# Patient Record
Sex: Male | Born: 1965
Health system: Southern US, Community
[De-identification: ages and names within clinical notes are randomized; demographics above are authoritative.]

## PROBLEM LIST (undated history)

## (undated) DIAGNOSIS — E785 Hyperlipidemia, unspecified: Secondary | ICD-10-CM

## (undated) DIAGNOSIS — E119 Type 2 diabetes mellitus without complications: Principal | ICD-10-CM

## (undated) DIAGNOSIS — I10 Essential (primary) hypertension: Secondary | ICD-10-CM

## (undated) HISTORY — PX: KNEE ARTHROSCOPY: SUR90

## (undated) HISTORY — PX: CERVICAL SPINE SURGERY: SHX589

## (undated) HISTORY — DX: Essential (primary) hypertension: I10

## (undated) HISTORY — DX: Type 2 diabetes mellitus without complications: E11.9

## (undated) HISTORY — DX: Hyperlipidemia, unspecified: E78.5

## (undated) HISTORY — PX: OTHER SURGICAL HISTORY: SHX169

---

## 2002-01-05 ENCOUNTER — Encounter: Admission: RE | Admit: 2002-01-05 | Discharge: 2002-04-05 | Payer: Self-pay | Admitting: Internal Medicine

## 2002-10-24 ENCOUNTER — Encounter: Payer: Self-pay | Admitting: Internal Medicine

## 2002-10-24 ENCOUNTER — Encounter: Admission: RE | Admit: 2002-10-24 | Discharge: 2002-10-24 | Payer: Self-pay | Admitting: Internal Medicine

## 2012-02-03 LAB — HEMOGLOBIN A1C: Hgb A1c MFr Bld: 8 % — AB (ref 4.0–6.0)

## 2012-05-06 LAB — HEMOGLOBIN A1C: Hgb A1c MFr Bld: 9.6 % — AB (ref 4.0–6.0)

## 2012-05-06 LAB — HM HEPATITIS C SCREENING LAB: HM Hepatitis Screen: NEGATIVE

## 2012-08-08 LAB — BASIC METABOLIC PANEL
Creatinine: 0.8 mg/dL (ref 0.6–1.3)
Glucose: 422 mg/dL
Potassium: 4.8 mmol/L (ref 3.4–5.3)

## 2012-09-07 LAB — BASIC METABOLIC PANEL: Glucose: 326 mg/dL

## 2012-09-07 LAB — HEMOGLOBIN A1C: Hgb A1c MFr Bld: 13.3 % — AB (ref 4.0–6.0)

## 2012-10-11 LAB — HEMOGLOBIN A1C: Hgb A1c MFr Bld: 12 % — AB (ref 4.0–6.0)

## 2013-05-12 LAB — HEMOGLOBIN A1C: Hgb A1c MFr Bld: 12.7 % — AB (ref 4.0–6.0)

## 2013-05-12 LAB — HEPATIC FUNCTION PANEL
ALT: 44 U/L — AB (ref 10–40)
AST: 25 U/L (ref 14–40)
Alkaline Phosphatase: 135 U/L — AB (ref 25–125)

## 2013-05-12 LAB — BASIC METABOLIC PANEL
Creatinine: 0.8 mg/dL (ref 0.6–1.3)
Glucose: 310 mg/dL
Potassium: 4.5 mmol/L (ref 3.4–5.3)

## 2013-05-12 LAB — LIPID PANEL
Cholesterol: 182 mg/dL (ref 0–200)
HDL: 45 mg/dL (ref 35–70)
LDL Cholesterol: 83 mg/dL
Triglycerides: 270 mg/dL — AB (ref 40–160)

## 2013-12-04 ENCOUNTER — Encounter (INDEPENDENT_AMBULATORY_CARE_PROVIDER_SITE_OTHER): Payer: Self-pay

## 2013-12-04 ENCOUNTER — Encounter: Payer: Self-pay | Admitting: Family Medicine

## 2013-12-04 ENCOUNTER — Ambulatory Visit (INDEPENDENT_AMBULATORY_CARE_PROVIDER_SITE_OTHER): Payer: 59 | Admitting: Family Medicine

## 2013-12-04 VITALS — BP 144/95 | HR 112 | Ht 65.5 in | Wt 220.0 lb

## 2013-12-04 DIAGNOSIS — E785 Hyperlipidemia, unspecified: Secondary | ICD-10-CM

## 2013-12-04 DIAGNOSIS — E1159 Type 2 diabetes mellitus with other circulatory complications: Secondary | ICD-10-CM | POA: Insufficient documentation

## 2013-12-04 DIAGNOSIS — E1169 Type 2 diabetes mellitus with other specified complication: Secondary | ICD-10-CM | POA: Insufficient documentation

## 2013-12-04 DIAGNOSIS — I152 Hypertension secondary to endocrine disorders: Secondary | ICD-10-CM | POA: Insufficient documentation

## 2013-12-04 DIAGNOSIS — I10 Essential (primary) hypertension: Secondary | ICD-10-CM

## 2013-12-04 DIAGNOSIS — E669 Obesity, unspecified: Secondary | ICD-10-CM

## 2013-12-04 DIAGNOSIS — E118 Type 2 diabetes mellitus with unspecified complications: Secondary | ICD-10-CM | POA: Insufficient documentation

## 2013-12-04 DIAGNOSIS — E119 Type 2 diabetes mellitus without complications: Secondary | ICD-10-CM

## 2013-12-04 HISTORY — DX: Type 2 diabetes mellitus without complications: E11.9

## 2013-12-04 HISTORY — DX: Essential (primary) hypertension: I10

## 2013-12-04 LAB — BASIC METABOLIC PANEL WITH GFR
BUN: 14 mg/dL (ref 6–23)
CO2: 25 mEq/L (ref 19–32)
Calcium: 9.3 mg/dL (ref 8.4–10.5)
Chloride: 99 mEq/L (ref 96–112)
Creat: 0.97 mg/dL (ref 0.50–1.35)
GFR, Est African American: >89 mL/min
GFR, Est Non African American: >89 mL/min
Glucose, Bld: 469 mg/dL — ABNORMAL HIGH (ref 70–99)
Potassium: 4.6 mEq/L (ref 3.5–5.3)
Sodium: 136 mEq/L (ref 135–145)

## 2013-12-04 LAB — HEMOGLOBIN A1C
Hgb A1c MFr Bld: 16.1 % — ABNORMAL HIGH (ref <5.7)
Mean Plasma Glucose: 415 mg/dL — ABNORMAL HIGH (ref <117)

## 2013-12-04 MED ORDER — AMLODIPINE BESY-BENAZEPRIL HCL 5-10 MG PO CAPS: 1.0000 | ORAL_CAPSULE | Freq: Every day | ORAL | Status: DC

## 2013-12-04 MED ORDER — PHENTERMINE HCL 37.5 MG PO CAPS: 37.5000 mg | ORAL_CAPSULE | ORAL | Status: DC

## 2013-12-04 NOTE — Progress Notes (Signed)
CC: Jordan Taylor is a 48 y.o. male is here for Establish Care   Subjective: HPI:  Very pleasant 48 year old here to establish care  Reports a history of type 2 diabetes unknown when this was first diagnosed. Most recent A1c in June 12.7. He reports that blood sugars are worsened by his low for soda he reports he will drink up to 10-12 cans of Pepsi a day. He reports polyuria or polydipsia over the past few months. Reports his blood sugar has been as low as 7-8 when he is able to control his cravings for sugar. He sees ophthalmology regularly. He was once on fargixa and bydueron however could not afford his medications.    Reports a history of essential hypertension: Has been on Lotrel but recently ran out of this medication. He reports he works out maybe once a week. Admits to not monitoring sodium intake.  Obesity: He has been on phentermine in the past and was able to lose an estimated 30 pounds about a year ago using phentermine. He denies any intolerance to the medication. Denies anxiety, depression, mental disturbance nor history of substance abuse   Review of Systems - General ROS: negative for - chills, fever, night sweats, weight gain or weight loss Ophthalmic ROS: negative for - decreased vision Psychological ROS: negative for - anxiety or depression ENT ROS: negative for - hearing change, nasal congestion, tinnitus or allergies Hematological and Lymphatic ROS: negative for - bleeding problems, bruising or swollen lymph nodes Breast ROS: negative Respiratory ROS: no cough, shortness of breath, or wheezing Cardiovascular ROS: no chest pain or dyspnea on exertion Gastrointestinal ROS: no abdominal pain, change in bowel habits, or black or bloody stools Genito-Urinary ROS: negative for - genital discharge, genital ulcers, incontinence or abnormal bleeding from genitals Musculoskeletal ROS: negative for - joint pain or muscle pain Neurological ROS: negative for - headaches or  memory loss Dermatological ROS: negative for lumps, mole changes, rash and skin lesion changes  Past Medical History  Diagnosis Date  . Type 2 diabetes mellitus 12/04/2013  . Essential hypertension 12/04/2013     Family History  Problem Relation Age of Onset  . Alcohol abuse Mother   . Alcohol abuse Father   . Diabetes Father   . Hypertension Father      History  Substance Use Topics  . Smoking status: Never Smoker   . Smokeless tobacco: Not on file  . Alcohol Use: No     Objective: Filed Vitals:   12/04/13 0839  BP: 144/95  Pulse: 112    General: Alert and Oriented, No Acute Distress HEENT: Pupils equal, round, reactive to light. Conjunctivae clear.   moist mucous membranes pharynx unremarkable  Lungs: Clear to auscultation bilaterally, no wheezing/ronchi/rales.  Comfortable work of breathing. Good air movement. Cardiac: Regular rate and rhythm. Normal S1/S2.  No murmurs, rubs, nor gallops.   Abdomen:  obese soft nontender  Extremities: No peripheral edema.  Strong peripheral pulses.  Mental Status: No depression, anxiety, nor agitation. Skin: Warm and dry.  Assessment & Plan: Jordan Taylor was seen today for establish care.  Diagnoses and associated orders for this visit:  Type 2 diabetes mellitus - BASIC METABOLIC PANEL WITH GFR - Hemoglobin A1c  Essential hypertension - BASIC METABOLIC PANEL WITH GFR - Hemoglobin A1c - amLODipine-benazepril (LOTREL) 5-10 MG per capsule; Take 1 capsule by mouth daily.  Obesity - phentermine 37.5 MG capsule; Take 1 capsule (37.5 mg total) by mouth every morning.  Hyperlipidemia    Type 2  diabetes: Uncontrolled, he is optimistic that he can get back into a exercise routine and decrease his cravings for Pepsi he contrast to lower his blood sugars. Will obtain a baseline A1c today and fasting blood sugar were both anticipate this will be quite elevated.  Starting phentermine if no clinical success with blood sugars after one month  of this will be much more aggressive with additional anti-hyperglycemics Essential hypertension: Uncontrolled restart Lotrel, recheck blood pressure in one month after starting phentermine Obesity:  Discussed the benefits of cutting out Pepsi and limiting total caloric intake start phentermine he has had significant success with in the past  Return in about 1 month (around 01/04/2014) for BP and Weight Check.

## 2013-12-05 ENCOUNTER — Telehealth: Payer: Self-pay | Admitting: Family Medicine

## 2013-12-05 DIAGNOSIS — E119 Type 2 diabetes mellitus without complications: Secondary | ICD-10-CM

## 2013-12-05 MED ORDER — SITAGLIPTIN PHOSPHATE 100 MG PO TABS: 100.0000 mg | ORAL_TABLET | Freq: Every day | ORAL | Status: DC

## 2013-12-05 NOTE — Telephone Encounter (Signed)
Pt's wife answered the phone and she was aware of pt's appt yesterday and it was apparent that he had talked to her about him having lab work. I did tell her the results and transferred her up front to schedule a month f/u for the patient

## 2013-12-05 NOTE — Telephone Encounter (Signed)
Sue LushAndrea, Will you please let Mr. Jordan Taylor know that as expected, his blood sugar was quite elevated at 469 with an A1c of 16.1.  If he's never tried Januvia I would strongly recommend he start this medication now to help bring blood sugar down along with metformin, diet, and exercise.  Samples placed in your inbox, I'd still encourage f/u in 1 month.

## 2013-12-13 ENCOUNTER — Encounter: Payer: Self-pay | Admitting: *Deleted

## 2013-12-13 ENCOUNTER — Other Ambulatory Visit: Payer: Self-pay | Admitting: Family Medicine

## 2013-12-13 DIAGNOSIS — E669 Obesity, unspecified: Secondary | ICD-10-CM

## 2013-12-13 DIAGNOSIS — E119 Type 2 diabetes mellitus without complications: Secondary | ICD-10-CM

## 2014-01-08 ENCOUNTER — Ambulatory Visit (INDEPENDENT_AMBULATORY_CARE_PROVIDER_SITE_OTHER): Payer: 59 | Admitting: Family Medicine

## 2014-01-08 ENCOUNTER — Encounter: Payer: Self-pay | Admitting: Family Medicine

## 2014-01-08 VITALS — BP 155/100 | HR 114 | Wt 218.0 lb

## 2014-01-08 DIAGNOSIS — E669 Obesity, unspecified: Secondary | ICD-10-CM

## 2014-01-08 DIAGNOSIS — E119 Type 2 diabetes mellitus without complications: Secondary | ICD-10-CM

## 2014-01-08 DIAGNOSIS — I10 Essential (primary) hypertension: Secondary | ICD-10-CM

## 2014-01-08 MED ORDER — AMLODIPINE BESY-BENAZEPRIL HCL 5-10 MG PO CAPS
2.0000 | ORAL_CAPSULE | Freq: Every day | ORAL | Status: DC
Start: 2014-01-08 — End: 2014-04-02

## 2014-01-08 MED ORDER — PHENTERMINE HCL 37.5 MG PO CAPS: 37.5000 mg | ORAL_CAPSULE | ORAL | Status: DC

## 2014-01-08 MED ORDER — SITAGLIPTIN PHOSPHATE 100 MG PO TABS: 100.0000 mg | ORAL_TABLET | Freq: Every day | ORAL | Status: DC

## 2014-01-08 NOTE — Progress Notes (Signed)
CC: Jordan Taylor is a 48 y.o. male is here for Diabetes   Subjective: HPI:  Followup type 2 diabetes: He continues on metformin and Januvia which she recently started and does not have any appreciable side effects. Denies polyuria polyphagia polydipsia nor poorly healing wounds or vision loss. He admits that he is still drinking up to 5-8 colas on a daily basis down from 10-12. Reports that he'll often do this late at night or in the early morning hours. Fasting blood sugars have been ranging in the low 200s no postprandials to report. Denies hypoglycemic episodes.  Followup obesity: Since restarting phentermine has noticed help with portion control when it comes to eating. He is also working out now less than most days of the week on a stairmaster, it sounds like 5 minutes at a time each day.  Followup essential hypertension: Since restarting Lotrel does not notice any known side effects. No outside blood pressures to report. Denies chest pain, shortness of breath, orthopnea, peripheral edema nor motor or sensory disturbances   Review Of Systems Outlined In HPI  Past Medical History  Diagnosis Date  . Type 2 diabetes mellitus 12/04/2013  . Essential hypertension 12/04/2013    Past Surgical History  Procedure Laterality Date  . Spinal cord surgery     Family History  Problem Relation Age of Onset  . Alcohol abuse Mother   . Alcohol abuse Father   . Diabetes Father   . Hypertension Father     History   Social History  . Marital Status: Married    Spouse Name: N/A    Number of Children: N/A  . Years of Education: N/A   Occupational History  . Not on file.   Social History Main Topics  . Smoking status: Never Smoker   . Smokeless tobacco: Not on file  . Alcohol Use: No  . Drug Use: No  . Sexual Activity: Yes    Partners: Female   Other Topics Concern  . Not on file   Social History Narrative  . No narrative on file     Objective: BP 155/100  Pulse 114  Wt 218  lb (98.884 kg)  General: Alert and Oriented, No Acute Distress HEENT: Pupils equal, round, reactive to light. Conjunctivae clear.  Moist mucous membranes pharynx unremarkable Lungs: Clear to auscultation bilaterally, no wheezing/ronchi/rales.  Comfortable work of breathing. Good air movement. Cardiac: Regular rate and rhythm. Normal S1/S2.  No murmurs, rubs, nor gallops.   Abdomen: Obese soft nontender Extremities: No peripheral edema.  Strong peripheral pulses.  Mental Status: No depression, anxiety, nor agitation. Skin: Warm and dry.  Assessment & Plan: Jordan Taylor was seen today for diabetes.  Diagnoses and associated orders for this visit:  Type 2 diabetes mellitus - sitaGLIPtin (JANUVIA) 100 MG tablet; Take 1 tablet (100 mg total) by mouth daily.  Obesity - phentermine 37.5 MG capsule; Take 1 capsule (37.5 mg total) by mouth every morning.  Essential hypertension - amLODipine-benazepril (LOTREL) 5-10 MG per capsule; Take 2 capsules by mouth daily.    Type 2 diabetes: Uncontrolled continue Januvia and metformin we discussed the importance of cutting out cola in his diet and that this would make a significant drop in his A1c in improve overall blood sugar on a daily basis. Stressed the importance of this intervention Obesity: Improving continue phentermine pending the below plan Essential hypertension: Uncontrolled increasing Lotrel if blood pressure remains uncontrolled at next month visit we'll have to stop the phentermine  Return in about  4 weeks (around 02/05/2014) for BP weight check.

## 2014-01-29 ENCOUNTER — Ambulatory Visit (INDEPENDENT_AMBULATORY_CARE_PROVIDER_SITE_OTHER): Payer: 59 | Admitting: Family Medicine

## 2014-01-29 ENCOUNTER — Encounter: Payer: Self-pay | Admitting: Family Medicine

## 2014-01-29 ENCOUNTER — Encounter (INDEPENDENT_AMBULATORY_CARE_PROVIDER_SITE_OTHER): Payer: Self-pay

## 2014-01-29 VITALS — BP 137/87 | HR 109 | Temp 98.4°F | Wt 221.0 lb

## 2014-01-29 DIAGNOSIS — R05 Cough: Secondary | ICD-10-CM

## 2014-01-29 DIAGNOSIS — A499 Bacterial infection, unspecified: Secondary | ICD-10-CM

## 2014-01-29 DIAGNOSIS — R059 Cough, unspecified: Secondary | ICD-10-CM

## 2014-01-29 DIAGNOSIS — J329 Chronic sinusitis, unspecified: Secondary | ICD-10-CM

## 2014-01-29 DIAGNOSIS — B9689 Other specified bacterial agents as the cause of diseases classified elsewhere: Secondary | ICD-10-CM

## 2014-01-29 MED ORDER — AMOXICILLIN-POT CLAVULANATE 500-125 MG PO TABS: ORAL_TABLET | ORAL | Status: AC

## 2014-01-29 MED ORDER — HYDROCODONE-HOMATROPINE 5-1.5 MG/5ML PO SYRP: 5.0000 mL | ORAL_SOLUTION | Freq: Three times a day (TID) | ORAL | Status: DC | PRN

## 2014-01-29 NOTE — Progress Notes (Signed)
CC: Jordan Taylor is a 48 y.o. male is here for URI?   Subjective: HPI:  Complains of nonproductive cough and facial pressure she has been present for the past 2 weeks moderate severity present all hours of the day worse lying down at night. Cough is described as nonproductive and keeping him awake at night on a nightly basis. No benefit from NyQuil, zycam, and Mucinex. Accompanied by fatigue and body aches facial pressure localized in the for head worse when leaning forward.  Denies shortness of breath, wheezing, blood and sputum, fevers, chills, confusion nor motor or sensory disturbances   Review Of Systems Outlined In HPI  Past Medical History  Diagnosis Date  . Type 2 diabetes mellitus 12/04/2013  . Essential hypertension 12/04/2013    Past Surgical History  Procedure Laterality Date  . Spinal cord surgery     Family History  Problem Relation Age of Onset  . Alcohol abuse Mother   . Alcohol abuse Father   . Diabetes Father   . Hypertension Father     History   Social History  . Marital Status: Married    Spouse Name: N/A    Number of Children: N/A  . Years of Education: N/A   Occupational History  . Not on file.   Social History Main Topics  . Smoking status: Never Smoker   . Smokeless tobacco: Not on file  . Alcohol Use: No  . Drug Use: No  . Sexual Activity: Yes    Partners: Female   Other Topics Concern  . Not on file   Social History Narrative  . No narrative on file     Objective: BP 137/87  Pulse 109  Temp(Src) 98.4 F (36.9 C) (Oral)  Wt 221 lb (100.245 kg)  SpO2 97%  General: Alert and Oriented, No Acute Distress HEENT: Pupils equal, round, reactive to light. Conjunctivae clear.  External ears unremarkable, canals clear with intact TMs with appropriate landmarks.  Middle ear appears open without effusion. Pink inferior turbinates.  Moist mucous membranes, pharynx without inflammation nor lesions however moderate post nasal drip.  Neck supple  without palpable lymphadenopathy nor abnormal masses. Lungs: Clear to auscultation bilaterally, no wheezing/ronchi/rales.  Comfortable work of breathing. Good air movement. Cardiac: Regular rate and rhythm. Normal S1/S2.  No murmurs, rubs, nor gallops.   Mental Status: No depression, anxiety, nor agitation. Skin: Warm and dry.  Assessment & Plan: Jordan Taylor was seen today for uri?.  Diagnoses and associated orders for this visit:  Cough - HYDROcodone-homatropine (HYCODAN) 5-1.5 MG/5ML syrup; Take 5 mLs by mouth every 8 (eight) hours as needed for cough.  Bacterial sinusitis - amoxicillin-clavulanate (AUGMENTIN) 500-125 MG per tablet; Take one by mouth every 8 hours for ten total days.    Bacterial sinusitis: Start Augmentin continue Mucinex consider nasal saline washes a start Hycodan to help with sleep suspect cough is due to postnasal drip   Return in about 1 week (around 02/05/2014) for weight check.

## 2014-02-07 ENCOUNTER — Ambulatory Visit (INDEPENDENT_AMBULATORY_CARE_PROVIDER_SITE_OTHER): Payer: 59 | Admitting: *Deleted

## 2014-02-07 ENCOUNTER — Encounter: Payer: Self-pay | Admitting: *Deleted

## 2014-02-07 VITALS — BP 122/81 | HR 94 | Wt 213.0 lb

## 2014-02-07 DIAGNOSIS — E669 Obesity, unspecified: Secondary | ICD-10-CM

## 2014-02-07 DIAGNOSIS — R635 Abnormal weight gain: Secondary | ICD-10-CM

## 2014-02-07 MED ORDER — PHENTERMINE HCL 37.5 MG PO CAPS
37.5000 mg | ORAL_CAPSULE | ORAL | Status: DC
Start: 2014-02-07 — End: 2014-03-05

## 2014-02-07 NOTE — Progress Notes (Signed)
BP and Weight Improving, Refilling Phentermine

## 2014-02-07 NOTE — Progress Notes (Signed)
   Subjective:    Patient ID: Jordan Taylor, male    DOB: [birth year], 48 y.o.   MRN: [id number]  HPI   Patient doing well on appetite suppressant.  Here for nurse visit, weight, BP, HR check.  Denies problems with insomnia, heart palpitations or tremors.  Satisfied with weight loss thus far and is working on [employer] diet and regular exercise.    Review of Systems     Objective:   Physical Exam        Assessment & Plan:

## 2014-02-07 NOTE — Progress Notes (Signed)
Pt was notified this am that meds will be sent and i faxed it to [facility] this am

## 2014-03-05 ENCOUNTER — Ambulatory Visit (INDEPENDENT_AMBULATORY_CARE_PROVIDER_SITE_OTHER): Payer: 59 | Admitting: Family Medicine

## 2014-03-05 ENCOUNTER — Encounter: Payer: Self-pay | Admitting: Family Medicine

## 2014-03-05 VITALS — BP 133/87 | HR 61 | Wt 215.0 lb

## 2014-03-05 DIAGNOSIS — E119 Type 2 diabetes mellitus without complications: Secondary | ICD-10-CM

## 2014-03-05 DIAGNOSIS — I10 Essential (primary) hypertension: Secondary | ICD-10-CM

## 2014-03-05 DIAGNOSIS — E669 Obesity, unspecified: Secondary | ICD-10-CM

## 2014-03-05 LAB — HEMOGLOBIN A1C
Hgb A1c MFr Bld: 10.1 % — ABNORMAL HIGH (ref <5.7)
Mean Plasma Glucose: 243 mg/dL — ABNORMAL HIGH (ref <117)

## 2014-03-05 MED ORDER — PHENTERMINE HCL 37.5 MG PO TABS: 37.5000 mg | ORAL_TABLET | Freq: Every day | ORAL | Status: DC

## 2014-03-05 NOTE — Progress Notes (Signed)
CC: Jordan Taylor is a 48 y.o. male is here for Diabetes   Subjective: HPI:  followup type 2 diabetes: He continues on metformin and Januvia daily basis no known side effects. Checking fasting blood sugars most recently have been in the low 200s. Denies polyuria polyphasia or polydipsia. He is working out to 3 times a week and tries to stay active during the day. He is noticing that pants sizes are shrinking along with short sizes. He believes that he is gaining lean muscle mass. He has questions regarding whether or not he should take chocolate milk or protein supplements after working out.  Followup obesity: Exercise as listed above. He is on phentermine a daily basis without noted side effects. Denies sleep disturbance, anxiety, nor chest pain.  Followup essential hypertension: No outside blood pressures to report that he is taking Lotrel on a daily basis. Denies chest pain shortness of breath orthopnea nor peripheral edema   Review Of Systems Outlined In HPI  Past Medical History  Diagnosis Date  . Type 2 diabetes mellitus 12/04/2013  . Essential hypertension 12/04/2013    Past Surgical History  Procedure Laterality Date  . Spinal cord surgery     Family History  Problem Relation Age of Onset  . Alcohol abuse Mother   . Alcohol abuse Father   . Diabetes Father   . Hypertension Father     History   Social History  . Marital Status: Married    Spouse Name: N/A    Number of Children: N/A  . Years of Education: N/A   Occupational History  . Not on file.   Social History Main Topics  . Smoking status: Never Smoker   . Smokeless tobacco: Not on file  . Alcohol Use: No  . Drug Use: No  . Sexual Activity: Yes    Partners: Female   Other Topics Concern  . Not on file   Social History Narrative  . No narrative on file     Objective: BP 133/87  Pulse 61  Wt 215 lb (97.523 kg)  General: Alert and Oriented, No Acute Distress HEENT: Pupils equal, round, reactive  to light. Conjunctivae clear.  Moist mucous membranes pharynx unremarkable Lungs: Clear to auscultation bilaterally, no wheezing/ronchi/rales.  Comfortable work of breathing. Good air movement. Cardiac: Regular rate and rhythm. Normal S1/S2.  No murmurs, rubs, nor gallops.   Extremities: No peripheral edema.  Strong peripheral pulses.  Mental Status: No depression, anxiety, nor agitation. Skin: Warm and dry.  Assessment & Plan: Jordan Taylor was seen today for diabetes.  Diagnoses and associated orders for this visit:  Essential hypertension  Obesity - phentermine (ADIPEX-P) 37.5 MG tablet; Take 1 tablet (37.5 mg total) by mouth daily before breakfast.  Type 2 diabetes mellitus - Hemoglobin A1c    Essential hypertension: Controlled continue Lotrel Obesity: Improving I believe that his weight gain is actually due to muscle mass, it does appear that he's losing fat, continue phentermine Type 2 diabetes: Uncontrolled based on fasting blood sugar check an A1c we'll consider XigduoXR doses based on his A1c  Return in about 3 months (around 06/04/2014).

## 2014-03-06 ENCOUNTER — Telehealth: Payer: Self-pay | Admitting: Family Medicine

## 2014-03-06 DIAGNOSIS — E119 Type 2 diabetes mellitus without complications: Secondary | ICD-10-CM

## 2014-03-06 MED ORDER — DAPAGLIFLOZIN PRO-METFORMIN ER 10-1000 MG PO TB24: 1.0000 | ORAL_TABLET | Freq: Every day | ORAL | Status: DC

## 2014-03-06 NOTE — Telephone Encounter (Signed)
Sue Lushndrea, Will you please let patient know that his A1c has improved from 16 to 10 with a goal of less than seven.  I would encourage him to take advantage of a new form of metformin that has an ingredient that will help his kidneys remove blood sugar through the urine.  I've sent an Rx of this in to replace the metformin he is already taking.  We have a coupon card here (on the counter) that will make this medication 100% free.

## 2014-03-06 NOTE — Telephone Encounter (Signed)
Pt.notified

## 2014-04-01 ENCOUNTER — Other Ambulatory Visit: Payer: Self-pay | Admitting: Family Medicine

## 2014-04-02 ENCOUNTER — Ambulatory Visit: Payer: 59

## 2014-04-02 ENCOUNTER — Other Ambulatory Visit: Payer: Self-pay | Admitting: *Deleted

## 2014-04-02 DIAGNOSIS — I10 Essential (primary) hypertension: Secondary | ICD-10-CM

## 2014-04-02 MED ORDER — AMLODIPINE BESY-BENAZEPRIL HCL 5-10 MG PO CAPS: 2.0000 | ORAL_CAPSULE | Freq: Every day | ORAL | Status: DC

## 2014-04-03 ENCOUNTER — Ambulatory Visit (INDEPENDENT_AMBULATORY_CARE_PROVIDER_SITE_OTHER): Payer: 59 | Admitting: Family Medicine

## 2014-04-03 VITALS — BP 137/89 | HR 104 | Ht 65.0 in | Wt 215.0 lb

## 2014-04-03 DIAGNOSIS — E669 Obesity, unspecified: Secondary | ICD-10-CM

## 2014-04-03 DIAGNOSIS — R635 Abnormal weight gain: Secondary | ICD-10-CM

## 2014-04-03 MED ORDER — PHENTERMINE HCL 37.5 MG PO TABS: 37.5000 mg | ORAL_TABLET | Freq: Every day | ORAL | Status: DC

## 2014-04-03 NOTE — Progress Notes (Signed)
Patient was in office for weight and blood pressure  Check. Jordan Taylor,CMA

## 2014-04-03 NOTE — Progress Notes (Signed)
Bjorn LoserRhonda, Will you please let patient know Rx is ready for fax/pickup, we need to see weight loss by next month or the FDA will not approve this medication to be continued.

## 2014-05-03 ENCOUNTER — Ambulatory Visit (INDEPENDENT_AMBULATORY_CARE_PROVIDER_SITE_OTHER): Payer: 59 | Admitting: Family Medicine

## 2014-05-03 ENCOUNTER — Encounter: Payer: Self-pay | Admitting: *Deleted

## 2014-05-03 VITALS — BP 132/75 | HR 100 | Wt 210.0 lb

## 2014-05-03 DIAGNOSIS — R635 Abnormal weight gain: Secondary | ICD-10-CM

## 2014-05-03 DIAGNOSIS — E669 Obesity, unspecified: Secondary | ICD-10-CM

## 2014-05-03 MED ORDER — PHENTERMINE HCL 37.5 MG PO TABS: 37.5000 mg | ORAL_TABLET | Freq: Every day | ORAL | Status: DC

## 2014-05-03 NOTE — Progress Notes (Signed)
Pt is here today for weight and blood pressure check. Pt is down 5 lbs from last visit. No complaints or side affects. Freida Busman CMA

## 2014-05-17 ENCOUNTER — Other Ambulatory Visit: Payer: Self-pay | Admitting: Family Medicine

## 2014-05-30 ENCOUNTER — Ambulatory Visit (INDEPENDENT_AMBULATORY_CARE_PROVIDER_SITE_OTHER): Payer: 59 | Admitting: Physician Assistant

## 2014-05-30 VITALS — BP 114/73 | HR 97 | Ht 65.0 in | Wt 204.0 lb

## 2014-05-30 DIAGNOSIS — E669 Obesity, unspecified: Secondary | ICD-10-CM

## 2014-05-30 DIAGNOSIS — R635 Abnormal weight gain: Secondary | ICD-10-CM

## 2014-05-30 MED ORDER — PHENTERMINE HCL 37.5 MG PO TABS: 37.5000 mg | ORAL_TABLET | Freq: Every day | ORAL | Status: DC

## 2014-05-30 NOTE — Progress Notes (Signed)
   Subjective:    Patient ID: Jordan Taylor, male    DOB: 08-12-1966, 48 y.o.   MRN: [id number]  HPI Patient presents today for weight and BP check for phentermine refill. Patient denies any chest pain, dizziness, SOB or abdominal pain while using medication. Patient was reminded to schedule another follow up appt in month.   Review of Systems     Objective:   Physical Exam        Assessment & Plan:  Pt down 6lbs. BP and HR stable. Refilled for another month. Follow up in one month. Keep up exercise and diet changes. Tandy GawJade Breeback PA-C

## 2014-06-29 ENCOUNTER — Ambulatory Visit: Payer: 59 | Admitting: Family Medicine

## 2014-06-29 ENCOUNTER — Ambulatory Visit (INDEPENDENT_AMBULATORY_CARE_PROVIDER_SITE_OTHER): Payer: 59 | Admitting: Family Medicine

## 2014-06-29 ENCOUNTER — Encounter: Payer: Self-pay | Admitting: Family Medicine

## 2014-06-29 VITALS — BP 114/77 | HR 94 | Temp 97.7°F | Wt 206.0 lb

## 2014-06-29 DIAGNOSIS — E669 Obesity, unspecified: Secondary | ICD-10-CM

## 2014-06-29 DIAGNOSIS — R635 Abnormal weight gain: Secondary | ICD-10-CM

## 2014-06-29 NOTE — Progress Notes (Signed)
Stacy, Will you please let patient know that due to lack of weight loss phentermine cannot be continued. There are a variety of other diet medications that can still be used and I would encourage him to followup with me if he is interested in trying alternative medications.

## 2014-06-29 NOTE — Progress Notes (Signed)
Pt came in today for bp and weight check. Pt is up 2 lbs. Pt states that he has been counting his calories, exercising with weights more and working out everyday and eating healthy./Kota Ciancio,CMA

## 2014-06-29 NOTE — Progress Notes (Signed)
I informed pt of the phentermine and the variety of other diet medications and pt made a follow up appointment with Dr. Ivan AnchorsHommel to discuss the alternatives. I also told the pt to continue counting his calorie, exercise and eating healthy./Trevell Pariseau,CMA

## 2014-07-02 ENCOUNTER — Encounter: Payer: Self-pay | Admitting: Family Medicine

## 2014-07-02 ENCOUNTER — Ambulatory Visit (INDEPENDENT_AMBULATORY_CARE_PROVIDER_SITE_OTHER): Payer: 59 | Admitting: Family Medicine

## 2014-07-02 VITALS — BP 121/80 | HR 97 | Wt 201.0 lb

## 2014-07-02 DIAGNOSIS — N529 Male erectile dysfunction, unspecified: Secondary | ICD-10-CM

## 2014-07-02 DIAGNOSIS — I10 Essential (primary) hypertension: Secondary | ICD-10-CM

## 2014-07-02 DIAGNOSIS — E119 Type 2 diabetes mellitus without complications: Secondary | ICD-10-CM

## 2014-07-02 DIAGNOSIS — E669 Obesity, unspecified: Secondary | ICD-10-CM

## 2014-07-02 MED ORDER — TADALAFIL 20 MG PO TABS: 10.0000 mg | ORAL_TABLET | Freq: Every day | ORAL | Status: DC | PRN

## 2014-07-02 MED ORDER — PHENTERMINE HCL 37.5 MG PO TABS: 37.5000 mg | ORAL_TABLET | Freq: Every day | ORAL | Status: DC

## 2014-07-02 NOTE — Progress Notes (Signed)
CC: Jordan Taylor is a 48 y.o. male is here for discuss wt loss meds   Subjective: HPI:  Followup obesity: Continues to go to gym most days of the week. Working on [employer], broadcasting/film/videostrength training and cardio. Has had success with phentermine with losing weight and feeling with portion control. When he was weighed last week during a nurse visit he tells me that he had his work clothes on and instruments in his pocket and he was not allowed to take out prior to being weighed. Denies anxiety, nor any other intolerance to phentermine  Followup type 2 diabetes: Continues on Januvia and dapagliflozin-metformin without any noted side effects. No outside blood sugars to report. Denies polyuria polyphagia polydipsia nor poorly healing wounds. Still trying to cut back on soda intake  Patient complains of difficulty maintaining an erection beyond a few minutes it has been worsening over the past month. Symptoms occur during every sexual encounter. No change in libido. Denies dysuria, polyuria, testicular pain or any history of exertional chest pain. He wants to know if he can try medication to help with erectile dysfunction  Followup essential hypertension: Continues to take Lotrel on daily basis with no outside blood pressures to report     Review Of Systems Outlined In HPI  Past Medical History  Diagnosis Date  . Type 2 diabetes mellitus 12/04/2013  . Essential hypertension 12/04/2013    Past Surgical History  Procedure Laterality Date  . Spinal cord surgery     Family History  Problem Relation Age of Onset  . Alcohol abuse Mother   . Alcohol abuse Father   . Diabetes Father   . Hypertension Father     History   Social History  . Marital Status: Married    Spouse Name: N/A    Number of Children: N/A  . Years of Education: N/A   Occupational History  . Not on file.   Social History Main Topics  . Smoking status: Never Smoker   . Smokeless tobacco: Not on file  . Alcohol Use: No  . Drug Use:  No  . Sexual Activity: Yes    Partners: Female   Other Topics Concern  . Not on file   Social History Narrative  . No narrative on file     Objective: BP 121/80  Pulse 97  Wt 201 lb (91.173 kg)  General: Alert and Oriented, No Acute Distress HEENT: Pupils equal, round, reactive to light. Conjunctivae clear.  Moist mucous membranes pharynx unremarkable Lungs: Clear to auscultation bilaterally, no wheezing/ronchi/rales.  Comfortable work of breathing. Good air movement. Cardiac: Regular rate and rhythm. Normal S1/S2.  No murmurs, rubs, nor gallops.   Abdomen: Mild obesity and soft Extremities: No peripheral edema.  Strong peripheral pulses.  Mental Status: No depression, anxiety, nor agitation. Skin: Warm and dry.  Assessment & Plan: Jordan Taylor was seen today for discuss wt loss meds.  Diagnoses and associated orders for this visit:  Obesity - phentermine (ADIPEX-P) 37.5 MG tablet; Take 1 tablet (37.5 mg total) by mouth daily before breakfast.  Essential hypertension  Type 2 diabetes mellitus without complication - HgB A1c  Erectile dysfunction, unspecified erectile dysfunction type - tadalafil (CIALIS) 20 MG tablet; Take 0.5-1 tablets (10-20 mg total) by mouth daily as needed for erectile dysfunction.    Obesity: Improving, he can restart phentermine in actually appears that he is l still osing weight on this medication. Discussed with him that at some point he is probably going to reach a plateau and  he should start looking into whether or not he would want to start on Belviq vs Qsymia once his plateau was reached Type 2 diabetes: Check an A1c today continue current antihyperglycemic spending results Essential hypertension: Controlled continue Lotrel Erectile dysfunction: Trial of Cialis   Return in about 3 months (around 10/02/2014).

## 2014-07-09 ENCOUNTER — Encounter: Payer: Self-pay | Admitting: Family Medicine

## 2014-07-18 ENCOUNTER — Other Ambulatory Visit: Payer: Self-pay | Admitting: Family Medicine

## 2014-07-31 ENCOUNTER — Encounter: Payer: Self-pay | Admitting: Family Medicine

## 2014-08-02 ENCOUNTER — Telehealth: Payer: Self-pay | Admitting: Family Medicine

## 2014-08-02 MED ORDER — PHENTERMINE-TOPIRAMATE ER 7.5-46 MG PO CP24: ORAL_CAPSULE | ORAL | Status: DC

## 2014-08-02 MED ORDER — PHENTERMINE-TOPIRAMATE ER 3.75-23 MG PO CP24: ORAL_CAPSULE | ORAL | Status: DC

## 2014-08-02 NOTE — Telephone Encounter (Signed)
Sue Lush, Will you please fax the Qsymia Rxs to the [facility] (in your inbox) and let patient know that these were sent?

## 2014-08-02 NOTE — Telephone Encounter (Signed)
Message left on vm 

## 2014-08-03 ENCOUNTER — Ambulatory Visit (INDEPENDENT_AMBULATORY_CARE_PROVIDER_SITE_OTHER): Payer: 59 | Admitting: Family Medicine

## 2014-08-03 ENCOUNTER — Telehealth: Payer: Self-pay | Admitting: *Deleted

## 2014-08-03 VITALS — BP 105/65 | HR 86 | Wt 205.0 lb

## 2014-08-03 DIAGNOSIS — R635 Abnormal weight gain: Secondary | ICD-10-CM

## 2014-08-03 DIAGNOSIS — E119 Type 2 diabetes mellitus without complications: Secondary | ICD-10-CM

## 2014-08-03 MED ORDER — DAPAGLIFLOZIN PRO-METFORMIN ER 10-1000 MG PO TB24: 1.0000 | ORAL_TABLET | Freq: Every day | ORAL | Status: DC

## 2014-08-03 NOTE — Telephone Encounter (Signed)
I don't think he's ever been prescribed kombiglyze.  Perhaps he means dapagliflozin-metformin which is once a day.  I've sent refills for this to the [place] pharmacy.  If he uses a savings card (which we have if he needs it) this medication should be free for a year.

## 2014-08-03 NOTE — Telephone Encounter (Signed)
Per dr. Ivan Anchors samples left at [facility] for pt to come pick up

## 2014-08-03 NOTE — Telephone Encounter (Signed)
Pt had been taking the kombiglyze medication twice a day instead of once a day.Pt is now out of medication of course. We have samples but not of the dose he is on. Please advise

## 2014-08-03 NOTE — Progress Notes (Signed)
   Subjective:    Patient ID: Jordan Taylor, male    DOB: Feb 15, 1966, 48 y.o.   MRN: [id number]  HPI  Jordan Taylor is here for a weight and blood pressure check. Patient gained 4 lbs. He did not give a reason for his increase in weight. Denies chest pain, shortness of breath, headaches or medication problems.  Review of Systems     Objective:   Physical Exam        Assessment & Plan:

## 2014-08-03 NOTE — Progress Notes (Signed)
Marylene Land, will you please let patient know that phentermine has lost it's effectiveness.   Switch to qsymia for weight loss which was faxed to [facility] [place] yesterday.

## 2014-08-04 LAB — HEMOGLOBIN A1C
Hgb A1c MFr Bld: 10.5 % — ABNORMAL HIGH (ref <5.7)
Mean Plasma Glucose: 255 mg/dL — ABNORMAL HIGH (ref <117)

## 2014-08-07 ENCOUNTER — Telehealth: Payer: Self-pay | Admitting: Family Medicine

## 2014-08-07 MED ORDER — GLIPIZIDE ER 5 MG PO TB24: 5.0000 mg | ORAL_TABLET | Freq: Every day | ORAL | Status: DC

## 2014-08-07 NOTE — Telephone Encounter (Signed)
Jordan Taylor, Will you please let patient know that his A1c was 10.5 with a goal of less than 7.  I'd encourage him to continue with his diabetic medication and to add an additional medication called glipizide which I've sent to the pharmacy at the [facility] in [facility].  We'll want to recheck his A1c in three months.

## 2014-08-07 NOTE — Telephone Encounter (Signed)
Left message on pt.'s vm.

## 2014-11-03 ENCOUNTER — Encounter: Payer: 59 | Attending: Family Medicine

## 2014-11-03 VITALS — Ht 65.0 in | Wt 219.0 lb

## 2014-11-03 DIAGNOSIS — E119 Type 2 diabetes mellitus without complications: Secondary | ICD-10-CM | POA: Diagnosis present

## 2014-11-03 DIAGNOSIS — Z713 Dietary counseling and surveillance: Secondary | ICD-10-CM | POA: Insufficient documentation

## 2014-11-05 NOTE — Progress Notes (Signed)
Patient was seen on 11/03/14 for the complete diabetes self-management series at the [facility]. This is a part of the [facility] [employer].  Handouts given during class include:  Living Well with Diabetes book  Carb Counting and Meal Planning book  Meal Plan Card  Carbohydrate guide  Meal planning worksheet  Low Sodium Flavoring Tips  The diabetes portion plate  Low Carbohydrate Snack Suggestions  A1c to eAG Conversion Chart  Diabetes Medications  Stress Management  Diabetes Recommended Care Schedule  Diabetes Success Plan  Core Class Satisfaction Survey  The following learning objectives were met by the patient during this course:  Describe diabetes  State some common risk factors for diabetes  Defines the role of glucose and insulin  Identifies type of diabetes and pathophysiology  Describe the relationship between diabetes and cardiovascular risk  State the members of the Healthcare Team  States the rationale for glucose monitoring  State when to test glucose  State their individual Target Range  State the importance of logging glucose readings  Describe how to interpret glucose readings  Identifies A1C target  Explain the correlation between A1c and eAG values  State symptoms and treatment of high blood glucose  State symptoms and treatment of low blood glucose  Explain proper technique for glucose testing  Identifies proper sharps disposal  Describe the role of different macronutrients on glucose  Explain how carbohydrates affect blood glucose  State what foods contain the most carbohydrates  Demonstrate carbohydrate counting  Demonstrate how to read Nutrition Facts food label  Describe effects of various fats on heart health  Describe the importance of good nutrition for health and healthy eating strategies  Describe techniques for managing your shopping, cooking and meal planning  List  strategies to follow meal plan when dining out  Describe the effects of alcohol on glucose and how to use it safely . State the amount of activity recommended for healthy living . Describe activities suitable for individual needs . Identify ways to regularly incorporate activity into daily life . Identify barriers to activity and ways to over come these barriers  Identify diabetes medications being personally used and their primary action for lowering glucose and possible side effects . Describe role of stress on blood glucose and develop strategies to address psychosocial issues . Identify diabetes complications and ways to prevent them  Explain how to manage diabetes during illness . Evaluate success in meeting personal goal . Establish 2-3 goals that they will plan to diligently work on until they return for the  55-month follow-up visit  Goals:   I will count my carb choices at most meals and snacks  I will be active 30 minutes or more 3 imes a week  I will take my diabetes medications as scheduled  I will test my glucose at least 2 times a day, 3 days a week  I will look at patterns in my record book at least 5 days a month  To help manage stress I will  Take medication as directed  Your patient has identified these potential barriers to change:  Motivation  Your patient has identified their diabetes self-care support plan as  [facility] Support Group Family Forensic scientist Resources  Plan: Follow up with [facility]

## 2014-12-07 ENCOUNTER — Encounter: Payer: Self-pay | Admitting: Family Medicine

## 2014-12-10 ENCOUNTER — Encounter: Payer: Self-pay | Admitting: Family Medicine

## 2014-12-10 ENCOUNTER — Telehealth: Payer: Self-pay | Admitting: *Deleted

## 2014-12-10 ENCOUNTER — Ambulatory Visit (INDEPENDENT_AMBULATORY_CARE_PROVIDER_SITE_OTHER): Payer: 59 | Admitting: Family Medicine

## 2014-12-10 VITALS — BP 150/87 | HR 119 | Ht 65.5 in | Wt 225.0 lb

## 2014-12-10 DIAGNOSIS — Z23 Encounter for immunization: Secondary | ICD-10-CM

## 2014-12-10 DIAGNOSIS — E119 Type 2 diabetes mellitus without complications: Secondary | ICD-10-CM

## 2014-12-10 DIAGNOSIS — Z1211 Encounter for screening for malignant neoplasm of colon: Secondary | ICD-10-CM

## 2014-12-10 DIAGNOSIS — Z0000 Encounter for general adult medical examination without abnormal findings: Secondary | ICD-10-CM

## 2014-12-10 MED ORDER — SITAGLIPTIN PHOSPHATE 100 MG PO TABS: 100.0000 mg | ORAL_TABLET | Freq: Every day | ORAL | Status: DC

## 2014-12-10 MED ORDER — AMLODIPINE BESY-BENAZEPRIL HCL 5-10 MG PO CAPS: ORAL_CAPSULE | ORAL | Status: DC

## 2014-12-10 MED ORDER — PRAVASTATIN SODIUM 20 MG PO TABS: 20.0000 mg | ORAL_TABLET | Freq: Every evening | ORAL | Status: DC

## 2014-12-10 MED ORDER — GLIPIZIDE ER 5 MG PO TB24: 5.0000 mg | ORAL_TABLET | Freq: Every day | ORAL | Status: DC

## 2014-12-10 NOTE — Telephone Encounter (Signed)
Prior auth inititiated in cover my meds and faxed to [employer]. Awaiting decision.

## 2014-12-10 NOTE — Patient Instructions (Signed)
Dr. Danika Kluender's General Advice Following Your Complete Physical Exam  The Benefits of Regular Exercise: Unless you suffer from an uncontrolled cardiovascular condition, studies strongly suggest that regular exercise and physical activity will add to both the quality and length of your life.  The World Health Organization recommends 150 minutes of moderate intensity aerobic activity every week.  This is best split over 3-4 days a week, and can be as simple as a brisk walk for just over 35 minutes "most days of the week".  This type of exercise has been shown to lower LDL-Cholesterol, lower average blood sugars, lower blood pressure, lower cardiovascular disease risk, improve memory, and increase one's overall sense of wellbeing.  The addition of anaerobic (or "strength training") exercises offers additional benefits including but not limited to increased metabolism, prevention of osteoporosis, and improved overall cholesterol levels.  How Can I Strive For A Low-Fat Diet?: Current guidelines recommend that 25-35 percent of your daily energy (food) intake should come from fats.  One might ask how can this be achieved without having to dissect each meal on a daily basis?  Switch to skim or 1% milk instead of whole milk.  Focus on lean meats such as ground turkey, fresh fish, baked chicken, and lean cuts of beef as your source of dietary protein.  Limit saturated fat consumption to less than 10% of your daily caloric intake.  Limit trans fatty acid consumption primarily by limiting synthetic trans fats such as partially hydrogenated oils (Ex: fried fast foods).  Substitute olive or vegetable oil for solid fats where possible.  Moderation of Salt Intake: Provided you don't carry a diagnosis of congestive heart failure nor renal failure, I recommend a daily allowance of no more than 2300 mg of salt (sodium).  Keeping under this daily goal is associated with a decreased risk of cardiovascular events, creeping  above it can lead to elevated blood pressures and increases your risk of cardiovascular events.  Milligrams (mg) of salt is listed on all nutrition labels, and your daily intake can add up faster than you think.  Most canned and frozen dinners can pack in over half your daily salt allowance in one meal.    Lifestyle Health Risks: Certain lifestyle choices carry specific health risks.  As you may already know, tobacco use has been associated with increasing one's risk of cardiovascular disease, pulmonary disease, numerous cancers, among many other issues.  What you may not know is that there are medications and nicotine replacement strategies that can more than double your chances of successfully quitting.  I would be thrilled to help manage your quitting strategy if you currently use tobacco products.  When it comes to alcohol use, I've yet to find an "ideal" daily allowance.  Provided an individual does not have a medical condition that is exacerbated by alcohol consumption, general guidelines determine "safe drinking" as no more than two standard drinks for a man or no more than one standard drink for a male per day.  However, much debate still exists on whether any amount of alcohol consumption is technically "safe".  My general advice, keep alcohol consumption to a minimum for general health promotion.  If you or others believe that alcohol, tobacco, or recreational drug use is interfering with your life, I would be happy to provide confidential counseling regarding treatment options.  General "Over The Counter" Nutrition Advice: Postmenopausal women should aim for a daily calcium intake of 1200 mg, however a significant portion of this might already be   provided by diets including milk, yogurt, cheese, and other dairy products.  Vitamin D has been shown to help preserve bone density, prevent fatigue, and has even been shown to help reduce falls in the elderly.  Ensuring a daily intake of 800 Units of  Vitamin D is a good place to start to enjoy the above benefits, we can easily check your Vitamin D level to see if you'd potentially benefit from supplementation beyond 800 Units a day.  Folic Acid intake should be of particular concern to women of childbearing age.  Daily consumption of 400-800 mcg of Folic Acid is recommended to minimize the chance of spinal cord defects in a fetus should pregnancy occur.    For many adults, accidents still remain one of the most common culprits when it comes to cause of death.  Some of the simplest but most effective preventitive habits you can adopt include regular seatbelt use, proper helmet use, securing firearms, and regularly testing your smoke and carbon monoxide detectors.  Jordan Burry B. See Beharry DO [facility] [address], [address] [place], [place] [address] Phone: [phone]  

## 2014-12-10 NOTE — Progress Notes (Signed)
CC: Jordan Taylor is a 49 y.o. male is here for Annual Exam   Subjective: HPI:   Colonoscopy: An order has been placed for colonoscopy now that he is over the age 59 Prostate: Discussed screening risks/beneifts with patient during today's visit, he is agreeable to PSA  Influenza Vaccine: Declined today  Pneumovax: No current indication Td/Tdap: Will receive tetanus booster today Zoster: (Start 51 yo)  Presents for complete physical exam with no acute complaints  Rare alcohol use no tobacco or recreational drug use  Review of Systems - General ROS: negative for - chills, fever, night sweats, weight gain or weight loss Ophthalmic ROS: negative for - decreased vision Psychological ROS: negative for - anxiety or depression ENT ROS: negative for - hearing change, nasal congestion, tinnitus or allergies Hematological and Lymphatic ROS: negative for - bleeding problems, bruising or swollen lymph nodes Breast ROS: negative Respiratory ROS: no cough, shortness of breath, or wheezing Cardiovascular ROS: no chest pain or dyspnea on exertion Gastrointestinal ROS: no abdominal pain, change in bowel habits, or black or bloody stools Genito-Urinary ROS: negative for - genital discharge, genital ulcers, incontinence or abnormal bleeding from genitals Musculoskeletal ROS: negative for - joint pain or muscle pain Neurological ROS: negative for - headaches or memory loss Dermatological ROS: negative for lumps, mole changes, rash and skin lesion changes  Past Medical History  Diagnosis Date  . Type 2 diabetes mellitus 12/04/2013  . Essential hypertension 12/04/2013    Past Surgical History  Procedure Laterality Date  . Spinal cord surgery     Family History  Problem Relation Age of Onset  . Alcohol abuse Mother   . Alcohol abuse Father   . Diabetes Father   . Hypertension Father   . Cancer Other     History   Social History  . Marital Status: Married    Spouse Name: N/A    Number  of Children: N/A  . Years of Education: N/A   Occupational History  . Not on file.   Social History Main Topics  . Smoking status: Never Smoker   . Smokeless tobacco: Not on file  . Alcohol Use: No  . Drug Use: No  . Sexual Activity:    Partners: Female   Other Topics Concern  . Not on file   Social History Narrative     Objective: BP 150/87 mmHg  Pulse 119  Ht 5' 5.5" (1.664 m)  Wt 225 lb (102.059 kg)  BMI 36.86 kg/m2  General: No Acute Distress HEENT: Atraumatic, normocephalic, conjunctivae normal without scleral icterus.  No nasal discharge, hearing grossly intact, TMs with good landmarks bilaterally with no middle ear abnormalities, posterior pharynx clear without oral lesions. Neck: Supple, trachea midline, no cervical nor supraclavicular adenopathy. Pulmonary: Clear to auscultation bilaterally without wheezing, rhonchi, nor rales. Cardiac: Regular rate and rhythm.  No murmurs, rubs, nor gallops. No peripheral edema.  2+ peripheral pulses bilaterally. Abdomen: Bowel sounds normal.  No masses.  Non-tender without rebound.  Negative Murphy's sign. GU: Bilateral descended testes without inguinal hernias MSK: Grossly intact, no signs of weakness.  Full strength throughout upper and lower extremities.  Full ROM in upper and lower extremities.  No midline spinal tenderness. Neuro: Gait unremarkable, CN II-XII grossly intact.  C5-C6 Reflex 2/4 Bilaterally, L4 Reflex 2/4 Bilaterally.  Cerebellar function intact. Skin: No rashes. Psych: Alert and oriented to person/place/time.  Thought process normal. No anxiety/depression. Assessment & Plan: Jordan Taylor was seen today for annual exam.  Diagnoses and associated orders  for this visit:  Annual physical exam - Lipid panel - COMPLETE METABOLIC PANEL WITH GFR - Hemoglobin A1c - CBC - PSA - Ambulatory referral to Gastroenterology  Type 2 diabetes mellitus without complication - Lipid panel - COMPLETE METABOLIC PANEL WITH  GFR - Hemoglobin A1c - CBC - sitaGLIPtin (JANUVIA) 100 MG tablet; Take 1 tablet (100 mg total) by mouth daily.  Screening for colon cancer - Ambulatory referral to Gastroenterology  Other Orders - amLODipine-benazepril (LOTREL) 5-10 MG per capsule; TAKE 2 CAPSULES BY MOUTH DAILY. - glipiZIDE (GLUCOTROL XL) 5 MG 24 hr tablet; Take 1 tablet (5 mg total) by mouth daily with breakfast. - pravastatin (PRAVACHOL) 20 MG tablet; Take 1 tablet (20 mg total) by mouth every evening.    Healthy lifestyle interventions including but not limited to regular exercise, a healthy low fat diet, moderation of salt intake, the dangers of tobacco/alcohol/recreational drug use, nutrition supplementation, and accident avoidance were discussed with the patient and a handout was provided for future reference.    Return in about 3 months (around 03/11/2015) for Diabetic Follow Up.

## 2014-12-10 NOTE — Addendum Note (Signed)
Addended by: Wyline BeadyMCCRIMMON, Arvell Pulsifer C on: 12/10/2014 03:53 PM   Modules accepted: Orders

## 2014-12-11 NOTE — Telephone Encounter (Signed)
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  Can you please ask the patient to pick up one of these here or have it mailed to him to take to his pharmacy.

## 2014-12-11 NOTE — Telephone Encounter (Signed)
Denial received today for Xigduo. Placed in Dr. Genelle BalHommel's box.

## 2014-12-12 NOTE — Telephone Encounter (Signed)
Voicemail left on mobile phone for Gerell explaining prior auth denial and the savings card and that he needed to come by office and pick up. He needed to call office if he wanted it mailed or had any questions. Savings card was placed in envelope with his name on it and put in drawer at front desk.

## 2015-01-28 ENCOUNTER — Ambulatory Visit (INDEPENDENT_AMBULATORY_CARE_PROVIDER_SITE_OTHER): Payer: 59 | Admitting: Family Medicine

## 2015-01-28 ENCOUNTER — Encounter: Payer: Self-pay | Admitting: Family Medicine

## 2015-01-28 VITALS — BP 132/84 | HR 105 | Wt 233.0 lb

## 2015-01-28 DIAGNOSIS — I10 Essential (primary) hypertension: Secondary | ICD-10-CM

## 2015-01-28 DIAGNOSIS — E119 Type 2 diabetes mellitus without complications: Secondary | ICD-10-CM

## 2015-01-28 DIAGNOSIS — N529 Male erectile dysfunction, unspecified: Secondary | ICD-10-CM

## 2015-01-28 DIAGNOSIS — E669 Obesity, unspecified: Secondary | ICD-10-CM

## 2015-01-28 MED ORDER — PHENTERMINE HCL 37.5 MG PO TABS: 37.5000 mg | ORAL_TABLET | Freq: Every day | ORAL | Status: DC

## 2015-01-28 MED ORDER — SILDENAFIL CITRATE 20 MG PO TABS: ORAL_TABLET | ORAL | Status: DC

## 2015-01-28 NOTE — Progress Notes (Signed)
CC: Jordan Taylor HockeyRobinson is a 49 y.o. male is here for Follow-up   Subjective: HPI:  Follow-up essential hypertension: Continues on Lotrel On a daily basis with no outside blood pressures to report. No chest pain shortness of breath orthopnea nor peripheral edema.  Follow-up type 2 diabetes: Continues on Januvia, leukocyte and dapagliflozin-metformin on a daily basis with no blood sugars to report. No polyuria nor polydipsia. No poorly healing wounds. He admits to extremely poor eating habits over the past 2 or 3 months due to losing his job and boredom. He decided to get back on track with low carbohydrate intake within the last week and he's been able to stick to it without any difficulty. He's having difficulty losing weight and was not fitting restart phentermine  Follow-up erectile dysfunction: He had benefit from Cialis without any side effects. He is unable to afford this though due to the price. He still reports difficulty with initiating and maintaining an erection without any other genitourinary complaints.   Review Of Systems Outlined In HPI  Past Medical History  Diagnosis Date  . Type 2 diabetes mellitus 12/04/2013  . Essential hypertension 12/04/2013    Past Surgical History  Procedure Laterality Date  . Spinal cord surgery     Family History  Problem Relation Age of Onset  . Alcohol abuse Mother   . Alcohol abuse Father   . Diabetes Father   . Hypertension Father   . Cancer Other     History   Social History  . Marital Status: Married    Spouse Name: N/A  . Number of Children: N/A  . Years of Education: N/A   Occupational History  . Not on file.   Social History Main Topics  . Smoking status: Never Smoker   . Smokeless tobacco: Not on file  . Alcohol Use: No  . Drug Use: No  . Sexual Activity:    Partners: Female   Other Topics Concern  . Not on file   Social History Narrative     Objective: BP 132/84 mmHg  Pulse 105  Wt 233 lb (105.688 kg)  SpO2  99%  General: Alert and Oriented, No Acute Distress HEENT: Pupils equal, round, reactive to light. Conjunctivae clear.  Moist mucous membranes Lungs: Clear to auscultation bilaterally, no wheezing/ronchi/rales.  Comfortable work of breathing. Good air movement. Cardiac: Regular rate and rhythm. Normal S1/S2.  No murmurs, rubs, nor gallops.   Abdomen: Obese and soft Extremities: No peripheral edema.  Strong peripheral pulses.  Mental Status: No depression, anxiety, nor agitation. Skin: Warm and dry.  Assessment & Plan: Thadius was seen today for follow-up.  Diagnoses and all orders for this visit:  Essential hypertension  Type 2 diabetes mellitus without complication  Erectile dysfunction, unspecified erectile dysfunction type Orders: -     sildenafil (REVATIO) 20 MG tablet; 1-4 tabs by mouth daily only as needed for sex  Obesity  Other orders -     phentermine (ADIPEX-P) 37.5 MG tablet; Take 1 tablet (37.5 mg total) by mouth daily before breakfast.   Type 2 diabetes: It's too early to get an A1c however I'm sure would be worse than back in January with his recent setbacks in diet and exercise interventions. We went over a formal diet and exercise plan including weight loss, no changes to her medications today Erectile dysfunction: [facility] Essential hypertension: Controlled continue Lotrel Obesity: Restart phentermine discussed only to be continued as long as he  is losing weight while on the medication.   Return in about 4 weeks (around 02/25/2015) for Weight Check.

## 2015-01-29 ENCOUNTER — Ambulatory Visit: Payer: 59 | Admitting: Family Medicine

## 2015-01-29 LAB — CBC
HCT: 43.7 % (ref 39.0–52.0)
Hemoglobin: 15.2 g/dL (ref 13.0–17.0)
MCH: 30.1 pg (ref 26.0–34.0)
MCHC: 34.8 g/dL (ref 30.0–36.0)
MCV: 86.5 fL (ref 78.0–100.0)
MPV: 10.1 fL (ref 8.6–12.4)
Platelets: 262 10*3/uL (ref 150–400)
RBC: 5.05 MIL/uL (ref 4.22–5.81)
RDW: 15.4 % (ref 11.5–15.5)
WBC: 8.6 10*3/uL (ref 4.0–10.5)

## 2015-01-29 LAB — COMPLETE METABOLIC PANEL WITH GFR
ALT: 43 U/L (ref 0–53)
AST: 26 U/L (ref 0–37)
Albumin: 4.3 g/dL (ref 3.5–5.2)
Alkaline Phosphatase: 109 U/L (ref 39–117)
BUN: 15 mg/dL (ref 6–23)
CO2: 25 mEq/L (ref 19–32)
Calcium: 9.5 mg/dL (ref 8.4–10.5)
Chloride: 103 mEq/L (ref 96–112)
Creat: 0.96 mg/dL (ref 0.50–1.35)
GFR, Est African American: >89 mL/min
GFR, Est Non African American: >89 mL/min
Glucose, Bld: 230 mg/dL — ABNORMAL HIGH (ref 70–99)
Potassium: 4.7 mEq/L (ref 3.5–5.3)
Sodium: 138 mEq/L (ref 135–145)
Total Bilirubin: 0.4 mg/dL (ref 0.2–1.2)
Total Protein: 6.8 g/dL (ref 6.0–8.3)

## 2015-01-29 LAB — LIPID PANEL
Cholesterol: 163 mg/dL (ref 0–200)
HDL: 42 mg/dL (ref >=40)
LDL Cholesterol: 79 mg/dL (ref 0–99)
Total CHOL/HDL Ratio: 3.9 Ratio
Triglycerides: 212 mg/dL — ABNORMAL HIGH (ref <150)
VLDL: 42 mg/dL — ABNORMAL HIGH (ref 0–40)

## 2015-01-29 LAB — PSA: PSA: 1.38 ng/mL (ref <=4.00)

## 2015-01-29 LAB — HEMOGLOBIN A1C
Hgb A1c MFr Bld: 10.1 % — ABNORMAL HIGH (ref <5.7)
Mean Plasma Glucose: 243 mg/dL — ABNORMAL HIGH (ref <117)

## 2015-02-15 ENCOUNTER — Encounter: Payer: Self-pay | Admitting: Family Medicine

## 2015-02-15 DIAGNOSIS — E119 Type 2 diabetes mellitus without complications: Secondary | ICD-10-CM

## 2015-02-15 MED ORDER — DAPAGLIFLOZIN PRO-METFORMIN ER 10-1000 MG PO TB24
1.0000 | ORAL_TABLET | Freq: Every day | ORAL | Status: DC
Start: 2015-02-15 — End: 2016-02-25

## 2015-02-25 ENCOUNTER — Other Ambulatory Visit: Payer: Self-pay | Admitting: Family Medicine

## 2015-02-27 ENCOUNTER — Encounter: Payer: Self-pay | Admitting: Family Medicine

## 2015-02-27 NOTE — Telephone Encounter (Signed)
Left message for Pt informing he would need a nurse visit BP/weight check before refill could be placed.

## 2015-02-28 ENCOUNTER — Ambulatory Visit (INDEPENDENT_AMBULATORY_CARE_PROVIDER_SITE_OTHER): Payer: 59 | Admitting: Family Medicine

## 2015-02-28 ENCOUNTER — Ambulatory Visit: Payer: 59 | Admitting: *Deleted

## 2015-02-28 VITALS — BP 110/67 | HR 108 | Ht 66.0 in | Wt 224.0 lb

## 2015-02-28 DIAGNOSIS — R635 Abnormal weight gain: Secondary | ICD-10-CM | POA: Diagnosis not present

## 2015-02-28 MED ORDER — PHENTERMINE HCL 37.5 MG PO TABS: 37.5000 mg | ORAL_TABLET | Freq: Every day | ORAL | Status: DC

## 2015-02-28 NOTE — Progress Notes (Signed)
Weight loss success, refill provided 

## 2015-02-28 NOTE — Progress Notes (Signed)
   Subjective:    Patient ID: Jordan Taylor, male    DOB: 03-24-1966, 49 y.o.   MRN: [id number]  HPI Patient is here for blood pressure and weight check. Denies any trouble sleeping, palpitations, or any other medication problems. Patient states he has made dietary changes and continuing on his exercise routine.   Review of Systems     Objective:   Physical Exam        Assessment & Plan:  Patient has lost weight. A refill for Phentermine will be sent to patient preferred pharmacy. Patient advised to schedule a four week nurse visit and keep upcoming appointment with PCP. Verbalized understanding, no further questions.

## 2015-03-08 LAB — HM DIABETES EYE EXAM

## 2015-03-11 ENCOUNTER — Ambulatory Visit: Payer: 59 | Admitting: Family Medicine

## 2015-03-15 ENCOUNTER — Encounter: Payer: Self-pay | Admitting: *Deleted

## 2015-04-01 ENCOUNTER — Telehealth: Payer: Self-pay | Admitting: Family Medicine

## 2015-04-01 NOTE — Telephone Encounter (Signed)
Received fax for PA on Xigduo XR sent through cover my meds waiting on auth. - CF

## 2015-04-02 NOTE — Telephone Encounter (Signed)
Received fax from [facility] and Xigduo XR was denied due to not meeting clinical requirements. - CF

## 2015-04-03 ENCOUNTER — Telehealth: Payer: Self-pay | Admitting: Family Medicine

## 2015-04-03 ENCOUNTER — Ambulatory Visit (INDEPENDENT_AMBULATORY_CARE_PROVIDER_SITE_OTHER): Payer: 59 | Admitting: Family Medicine

## 2015-04-03 VITALS — BP 108/72 | HR 92 | Ht 66.0 in | Wt 219.0 lb

## 2015-04-03 DIAGNOSIS — R635 Abnormal weight gain: Secondary | ICD-10-CM

## 2015-04-03 MED ORDER — PHENTERMINE HCL 37.5 MG PO TABS: 37.5000 mg | ORAL_TABLET | Freq: Every day | ORAL | Status: DC

## 2015-04-03 NOTE — Telephone Encounter (Signed)
Sue Lushndrea, Will you please let patient know that our insurance has denied coverage for Xigduo XR.  I think this denial could be bypassed if he tries to use one of the savings cards in our closet when he goes to his pharmacy.  If this does not work please let me know and I'll send in a alternative covered medication called Kirk RuthsSynjardy which is covered.

## 2015-04-03 NOTE — Telephone Encounter (Signed)
Pt.notified

## 2015-04-03 NOTE — Progress Notes (Signed)
   Subjective:    Patient ID: Jordan Taylor, male    DOB: 1966-10-07, 49 y.o.   MRN: [id number]  HPI  Xander is here for blood pressure and weight check. Denies insomnia or palpitations.   Review of Systems     Objective:   Physical Exam        Assessment & Plan:  Abnormal Weight Gain - Patient has lost weight. A refill of phentermine given to patient. Follow up in 1 month.

## 2015-04-03 NOTE — Progress Notes (Signed)
Weight loss success therefore refill provided

## 2015-04-04 ENCOUNTER — Other Ambulatory Visit: Payer: Self-pay | Admitting: *Deleted

## 2015-04-04 NOTE — Patient Outreach (Signed)
Left message for Karandeep on his mobile phone requesting that he call Iverson AlaminLaura Greeson, care management assistant, at [phone] to schedule a follow up [facility] appointment and explained that per program guidelines , he should be seeing a member of the care team every month until he has achieved glycemic control as evidenced by meeting target A1C. Bary RichardJanet S. Hauser RN,CCM,CDE [facility] Care Management Coordinator Office Phone [phone] Office Fax [phone]

## 2015-04-16 ENCOUNTER — Other Ambulatory Visit: Payer: Self-pay | Admitting: Family Medicine

## 2015-05-03 ENCOUNTER — Ambulatory Visit (INDEPENDENT_AMBULATORY_CARE_PROVIDER_SITE_OTHER): Payer: 59 | Admitting: Family Medicine

## 2015-05-03 VITALS — BP 127/80 | HR 105 | Wt 217.0 lb

## 2015-05-03 DIAGNOSIS — R635 Abnormal weight gain: Secondary | ICD-10-CM | POA: Diagnosis not present

## 2015-05-03 MED ORDER — PHENTERMINE HCL 37.5 MG PO TABS: 37.5000 mg | ORAL_TABLET | Freq: Every day | ORAL | Status: DC

## 2015-05-03 NOTE — Progress Notes (Signed)
successful weight loss refill provided

## 2015-05-03 NOTE — Progress Notes (Signed)
   Subjective:    Patient ID: Jordan Taylor, male    DOB: 09/17/1966, 49 y.o.   MRN: [id number]  HPI  Patient is here for blood pressure and weight check. Denies any trouble sleeping, palpitations, or any other medication problems.   Review of Systems     Objective:   Physical Exam        Assessment & Plan:  Patient has lost some weight. A refill for Phentermine will have to be approved by PCP prior to be filled. Verbalized understanding, no further questions.

## 2015-06-05 ENCOUNTER — Ambulatory Visit (INDEPENDENT_AMBULATORY_CARE_PROVIDER_SITE_OTHER): Payer: 59 | Admitting: Sports Medicine

## 2015-06-05 VITALS — BP 111/68 | HR 94 | Wt 212.0 lb

## 2015-06-05 DIAGNOSIS — R635 Abnormal weight gain: Secondary | ICD-10-CM

## 2015-06-05 DIAGNOSIS — E669 Obesity, unspecified: Secondary | ICD-10-CM

## 2015-06-05 MED ORDER — PHENTERMINE HCL 37.5 MG PO TABS: 37.5000 mg | ORAL_TABLET | Freq: Every day | ORAL | Status: DC

## 2015-06-05 NOTE — Progress Notes (Signed)
   Subjective:    Patient ID: Jordan Taylor, male    DOB: [birth year], 49 y.o.   MRN: [id number]  HPI  Roemello is here for a weight and blood pressure check. Denies palpitations or insomnia.    Review of Systems     Objective:   Physical Exam        Assessment & Plan:  Patient has lost weight. Refill of phentermine was given to patient.

## 2015-06-05 NOTE — Assessment & Plan Note (Signed)
Good weight loss, refilling phentermine. 

## 2015-06-21 ENCOUNTER — Encounter: Payer: Self-pay | Admitting: *Deleted

## 2015-07-02 ENCOUNTER — Telehealth: Payer: Self-pay | Admitting: Family Medicine

## 2015-07-02 ENCOUNTER — Encounter: Payer: Self-pay | Admitting: Family Medicine

## 2015-07-02 DIAGNOSIS — N529 Male erectile dysfunction, unspecified: Secondary | ICD-10-CM

## 2015-07-02 MED ORDER — SILDENAFIL CITRATE 20 MG PO TABS: ORAL_TABLET | ORAL | Status: DC

## 2015-07-02 MED ORDER — AMLODIPINE BESYLATE 5 MG PO TABS: 5.0000 mg | ORAL_TABLET | Freq: Every day | ORAL | Status: DC

## 2015-07-02 MED ORDER — BENAZEPRIL HCL 10 MG PO TABS: 10.0000 mg | ORAL_TABLET | Freq: Every day | ORAL | Status: DC

## 2015-07-02 NOTE — Telephone Encounter (Signed)
Sue Lush, Rx placed in in-box ready for pickup/faxing.  (From MyChart [facility] Doctor Ivan Anchors hope all is well sending you this note about my medication I need to get a refill from you for Sildenafil  and can we change the lotrel 5/10 to Benazepril  and amlodipine  this change will result in me getting it for FREE if this is possible could I pick-up the prescriptions at front desk thanks for all your help)

## 2015-07-03 NOTE — Telephone Encounter (Signed)
rx was faxed yesterday 

## 2015-07-08 ENCOUNTER — Ambulatory Visit (INDEPENDENT_AMBULATORY_CARE_PROVIDER_SITE_OTHER): Payer: 59 | Admitting: Family Medicine

## 2015-07-08 ENCOUNTER — Other Ambulatory Visit: Payer: Self-pay | Admitting: *Deleted

## 2015-07-08 VITALS — BP 123/83 | HR 101 | Wt 211.0 lb

## 2015-07-08 DIAGNOSIS — R635 Abnormal weight gain: Secondary | ICD-10-CM

## 2015-07-08 MED ORDER — PHENTERMINE HCL 37.5 MG PO TABS: 37.5000 mg | ORAL_TABLET | Freq: Every day | ORAL | Status: DC

## 2015-07-08 NOTE — Progress Notes (Signed)
   Subjective:    Patient ID: Jordan Taylor, male    DOB: 27-Aug-1966, 49 y.o.   MRN: [id number]  HPI  Patient is here for blood pressure and weight check. Denies any trouble sleeping, palpitations, or any other medication problems. Pt does report he has been going to the gym and working on putting on some muscle.  Review of Systems     Objective:   Physical Exam        Assessment & Plan:  Patient has lost weight. A refill for Phentermine will be sent to patient preferred pharmacy. Patient advised to schedule a four week nurse visit and keep her upcoming appointment with her PCP. Verbalized understanding, no further questions.

## 2015-07-15 ENCOUNTER — Encounter: Payer: Self-pay | Admitting: *Deleted

## 2015-07-15 ENCOUNTER — Other Ambulatory Visit: Payer: Self-pay | Admitting: *Deleted

## 2015-07-15 VITALS — BP 98/70 | Ht 65.0 in | Wt 213.6 lb

## 2015-07-15 DIAGNOSIS — E119 Type 2 diabetes mellitus without complications: Secondary | ICD-10-CM

## 2015-07-15 LAB — POCT GLYCOSYLATED HEMOGLOBIN (HGB A1C): Hemoglobin A1C: 6.5

## 2015-07-15 NOTE — Patient Outreach (Signed)
[facility]) Care Management   07/15/2015  Jordan Taylor [birth year] [id number]  Jordan Taylor is an 49 y.o. male who presents to the [facility] CM office for routine Link To Wellness follow up for self management assistance with Type II DM, HTN and hyperlipidemia.  Subjective:  Jordan Taylor says he is continuing to lose weight through diet and exercise and has seen an improvement in his blood sugars. He states he is consistently adherent with his medications.  Objective:   Review of Systems  Constitutional: Negative.     Physical Exam  Constitutional: He is oriented to person, place, and time. He appears well-developed and well-nourished.  Musculoskeletal: Normal range of motion.  Neurological: He is alert and oriented to person, place, and time.  Skin: Skin is warm and dry.  Psychiatric: He has a normal mood and affect. His behavior is normal. Judgment and thought content normal.   Filed Vitals:   07/15/15 0833  BP: 98/70   Filed Weights   07/15/15 0833  Weight: 213 lb 9.6 oz (96.888 kg)  POC A1C= 6.5% POC post prandial CBG= 158 Current Medications:   Current Outpatient Prescriptions  Medication Sig Dispense Refill  . amLODipine (NORVASC) 5 MG tablet Take 1 tablet (5 mg total) by mouth daily. 90 tablet 1  . benazepril (LOTENSIN) 10 MG tablet Take 1 tablet (10 mg total) by mouth daily. 90 tablet 1  . Dapagliflozin-Metformin HCl ER 08-999 MG TB24 Take 1 tablet by mouth daily. 30 tablet 11  . glipiZIDE (GLUCOTROL XL) 5 MG 24 hr tablet Take 1 tablet (5 mg total) by mouth daily with breakfast. 30 tablet 4  . JANUVIA 100 MG tablet TAKE 1 TABLET (100 MG TOTAL) BY MOUTH DAILY. 30 tablet 3  . pravastatin (PRAVACHOL) 20 MG tablet Take 1 tablet (20 mg total) by mouth every evening. 1 tablet 11  . sildenafil (REVATIO) 20 MG tablet 1-4 tabs by mouth daily only as needed for sex 50 tablet 4  . phentermine (ADIPEX-P) 37.5 MG tablet Take 1 tablet (37.5 mg total) by mouth  daily before breakfast. (Patient not taking: Reported on 07/15/2015) 30 tablet 0   No current facility-administered medications for this visit.    Functional Status:   In your present state of health, do you have any difficulty performing the following activities: 07/15/2015  Hearing? N  Vision? N  Difficulty concentrating or making decisions? N  Walking or climbing stairs? N  Dressing or bathing? N  Doing errands, shopping? N    Fall/Depression Screening:    PHQ 2/9 Scores 07/15/2015 11/05/2014  PHQ - 2 Score 0 0   [facility] CM Care Plan Problem One        Patient Outreach from 07/15/2015 in [employer]   Care Plan Problem One  Type II DM with significant improvement in glycemic control as evidenced by POC A1C= 6.5% on 07/15/15 (previous A1C= 10.1% on 01/28/15). also meeting  treatment targets for HTN and hyperlipidemia except triglycerides were 212 on 01/18/15   Care Plan for Problem One  Active   [facility] Long Term Goal (31-90 days)  Continued good control of DM, HTN and hyperlipidemia as evidenced by meeting treatment targets of A1C <7.0%, BP <140/90 and  normal  values on lipid profile at each assessment   [facility] Long Term Goal Start Date  07/15/15   Interventions for Problem One Long Term Goal  reviewed basic pathophysiology of Type II DM, assessed patient's lifestyle changes over last 6 months that included  consistent exercise program/medication adherence and CHO controlled meal plan and the positive effect of these changes on heis chronic health conditions, reviewed CBG readings, reviewed portion sizes fo CHO and protein at meals and snacks, pre and post meal CBG targets, effect of exercise and weight loss on insulin sensitivity  discussed results of POC A1C, and its correlation to estimated averge glucose and A1C target, congratulated Jordan Taylor on the significant improvement in glycemic control  over the last 6 months,  discussed strategies to keep him motivated to continue to engage in  behavior change to improve overall health,  will arrange for Link To Wellness follow up with Jordan Taylor and his wife Jordan Taylor in 6 months      Assessment:   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 (suspect triglycerides will normalize now that glycemic control is good)  Plan:  RNCM to fax today's office visit note to Dr. Ivan Anchors. RNCM will meet every 6 months and as needed with patient per Link To Wellness program guidelines to assist with Type II DM, HTN and hyperlipidemia self-management and assess patient's progress toward mutually set goals.  [name] RN,CCM,CDE [facility] Care Management Coordinator Link To Wellness Office Phone [phone] Office Fax [phone]

## 2015-08-01 ENCOUNTER — Telehealth: Payer: Self-pay

## 2015-08-06 ENCOUNTER — Other Ambulatory Visit: Payer: Self-pay | Admitting: Family Medicine

## 2015-08-12 ENCOUNTER — Ambulatory Visit (INDEPENDENT_AMBULATORY_CARE_PROVIDER_SITE_OTHER): Payer: 59 | Admitting: Family Medicine

## 2015-08-12 ENCOUNTER — Other Ambulatory Visit: Payer: Self-pay | Admitting: Family Medicine

## 2015-08-12 VITALS — BP 130/87 | HR 92 | Wt 212.0 lb

## 2015-08-12 DIAGNOSIS — R635 Abnormal weight gain: Secondary | ICD-10-CM

## 2015-08-12 MED ORDER — PHENTERMINE HCL 37.5 MG PO TABS: 37.5000 mg | ORAL_TABLET | Freq: Every day | ORAL | Status: DC

## 2015-08-12 NOTE — Progress Notes (Signed)
Left message on patient's vm that rx has been faxed

## 2015-08-12 NOTE — Progress Notes (Signed)
   Subjective:    Patient ID: Jordan Taylor, male    DOB: 1966/11/11, 49 y.o.   MRN: [id number]  HPI  Patient is here for blood pressure and weight check. Denies any trouble sleeping, palpitations, or any other medication problems. Pt reports continuing to go to the gym as well as taking the Phentermine Rx.  Review of Systems     Objective:   Physical Exam        Assessment & Plan:  Patient has not lost weight. A refill for Phentermine will be sent to Provider for review. Patient advised I will contact him regarding if a refill will be sent, if so Pt will need to schedule a four week nurse visit and keep upcoming appointment with her PCP. Verbalized understanding, no further questions.

## 2015-08-12 NOTE — Progress Notes (Signed)
Weight loss success, refill provided 

## 2015-08-19 ENCOUNTER — Other Ambulatory Visit: Payer: Self-pay | Admitting: Family Medicine

## 2015-09-04 ENCOUNTER — Ambulatory Visit (INDEPENDENT_AMBULATORY_CARE_PROVIDER_SITE_OTHER): Payer: 59 | Admitting: Family Medicine

## 2015-09-04 ENCOUNTER — Telehealth: Payer: Self-pay | Admitting: *Deleted

## 2015-09-04 ENCOUNTER — Encounter: Payer: Self-pay | Admitting: Family Medicine

## 2015-09-04 VITALS — BP 130/85 | Wt 211.0 lb

## 2015-09-04 DIAGNOSIS — J329 Chronic sinusitis, unspecified: Secondary | ICD-10-CM

## 2015-09-04 DIAGNOSIS — I10 Essential (primary) hypertension: Secondary | ICD-10-CM

## 2015-09-04 DIAGNOSIS — E119 Type 2 diabetes mellitus without complications: Secondary | ICD-10-CM | POA: Diagnosis not present

## 2015-09-04 DIAGNOSIS — R635 Abnormal weight gain: Secondary | ICD-10-CM | POA: Diagnosis not present

## 2015-09-04 DIAGNOSIS — A499 Bacterial infection, unspecified: Secondary | ICD-10-CM

## 2015-09-04 DIAGNOSIS — B9689 Other specified bacterial agents as the cause of diseases classified elsewhere: Secondary | ICD-10-CM

## 2015-09-04 MED ORDER — AMLODIPINE BESYLATE 10 MG PO TABS: 10.0000 mg | ORAL_TABLET | Freq: Every day | ORAL | Status: DC

## 2015-09-04 MED ORDER — AMLODIPINE BESYLATE 5 MG PO TABS: 5.0000 mg | ORAL_TABLET | Freq: Every day | ORAL | Status: DC

## 2015-09-04 MED ORDER — AMOXICILLIN-POT CLAVULANATE 500-125 MG PO TABS: ORAL_TABLET | ORAL | Status: AC

## 2015-09-04 MED ORDER — BENAZEPRIL HCL 20 MG PO TABS: 20.0000 mg | ORAL_TABLET | Freq: Every day | ORAL | Status: DC

## 2015-09-04 MED ORDER — GLIPIZIDE ER 5 MG PO TB24: 5.0000 mg | ORAL_TABLET | Freq: Every day | ORAL | Status: DC

## 2015-09-04 MED ORDER — PHENTERMINE HCL 37.5 MG PO TABS: 37.5000 mg | ORAL_TABLET | Freq: Every day | ORAL | Status: DC

## 2015-09-04 MED ORDER — BENAZEPRIL HCL 10 MG PO TABS: 10.0000 mg | ORAL_TABLET | Freq: Every day | ORAL | Status: DC

## 2015-09-04 NOTE — Telephone Encounter (Signed)
Pt notified. He already picked up the amlodipine 10. I asked if the tablets were scored and if so he could cut them in half he could take 5 mg until he ran out and then take the other when he ran out

## 2015-09-04 NOTE — Telephone Encounter (Signed)
Pt's wife called and states pt says the Lotrel is supposed to be written for 20 mg because he had started taking two tablets.

## 2015-09-04 NOTE — Telephone Encounter (Signed)
Sorry, I was under the impression he was taking two of the amlodipine.  Will you please ask her to throw away the Amlodipine  and Lotrel  Rx from this morning and instead use the Amlodipine 5 and Lotrel  Rx printed and placed in your in box.

## 2015-09-04 NOTE — Progress Notes (Signed)
CC: Jordan Taylor is a 49 y.o. male is here for Cough   Subjective: HPI:  Complains of nonproductive cough with postnasal drip and facial pressure in the cheeks that has been present for the past week and a half. Symptoms are worse first thing in the morning and slightly improved during the day. Seems to be slightly improved with Mucinex as well. Symptoms have been persistent ever since onset. No better or worse since onset. Denies fevers, chills, wheezing, shortness of breath or sore throat.   Follow-up essential hypertension: He noticed that his blood pressure was about 140/90 so began taking 2 of amlodipine on a daily basis. he can't member when this occurred but it seemed to help his blood pressure. He denies any known side effects of this medication nor benazepril which she is taking on a daily basis. Denies chest pain . Requesting refills on blood pressure medication    follow-up type 2 diabetes: He had an A1c of 6.5 in late August. He's been going to the gym most days of the week and has cut out carbs in his diet. He is requesting refills on glipizide and continues on dapagliflozin-metformin daily. blood sugars report. denies polyuria polyphagia or polydipsia.    follow-up abnormal weight gain: He is requesting a refill on phentermine. He's taking this on a daily basis and believes it's helping greatly with reduction of food craving. His eating about 2500 cal a day. He is going to the gym to try to increase muscle mass. Denies sleep disturbance or anxiety while taking this medication    Review Of Systems Outlined In HPI  Past Medical History  Diagnosis Date  . Type 2 diabetes mellitus 12/04/2013  . Essential hypertension 12/04/2013    Past Surgical History  Procedure Laterality Date  . Spinal cord surgery     Family History  Problem Relation Age of Onset  . Alcohol abuse Mother   . Alcohol abuse Father   . Diabetes Father   . Hypertension Father   . Cancer Other     Social  History   Social History  . Marital Status: Married    Spouse Name: [name]  . Number of Children: N/A  . Years of Education: N/A   Occupational History  . [employer] coach   Social History Main Topics  . Smoking status: Former Games developer  . Smokeless tobacco: Never Used  . Alcohol Use: No  . Drug Use: No  . Sexual Activity:    Partners: Female   Other Topics Concern  . Not on file   Social History Narrative     Objective: BP 130/85 mmHg  Wt 211 lb (95.709 kg)  General: Alert and Oriented, No Acute Distress HEENT: Pupils equal, round, reactive to light. Conjunctivae clear.  External ears unremarkable, canals clear with intact TMs with appropriate landmarks.  Middle ear appears open without effusion. Pink inferior turbinates.  Moist mucous membranes, pharynx without inflammation nor lesions.  Neck supple without palpable lymphadenopathy nor abnormal masses. Lungs: Clear to auscultation bilaterally, no wheezing/ronchi/rales.  Comfortable work of breathing. Good air movement. Cardiac: Regular rate and rhythm. Normal S1/S2.  No murmurs, rubs, nor gallops.   Extremities: No peripheral edema.  Strong peripheral pulses.  Mental Status: No depression, anxiety, nor agitation. Skin: Warm and dry.  noticeable increase in muscle mass and decreased adipose tissue compared to last time I saw him.  Assessment & Plan: Arbor was seen today for cough.  Diagnoses and all orders for  this visit:  Bacterial sinusitis -     amoxicillin-clavulanate (AUGMENTIN) 500-125 MG tablet; Take one by mouth every 8 hours for ten total days.  Essential hypertension -     benazepril (LOTENSIN) 10 MG tablet; Take 1 tablet (10 mg total) by mouth daily. -     amLODipine (NORVASC) 10 MG tablet; Take 1 tablet (10 mg total) by mouth daily.  Type 2 diabetes mellitus without complication, without long-term current use of insulin (HCC) -     glipiZIDE (GLUCOTROL XL) 5 MG 24 hr tablet;  Take 1 tablet (5 mg total) by mouth daily with breakfast.  Abnormal weight gain -     phentermine (ADIPEX-P) 37.5 MG tablet; Take 1 tablet (37.5 mg total) by mouth daily before breakfast.    bacterial sinusitis: Start Augmentin consider nasal saline washes continue Mucinex   essential hypertension: Improved on increased amlodipine, continue 10 mg daily and 10 mg of benazepril daily. Type 2 diabetes: Quickly controlled continue glipizide and dapagliflozin-metformin. Abnormal weight gain:Although his weight is technically the same as it was at his last weight and he is noticeably more defined from a muscular standpoint and I believe that phentermine continues to help him lose adipose tissue.   Return in about 4 weeks (around 10/02/2015) for BP and Weight Check.

## 2015-09-09 ENCOUNTER — Other Ambulatory Visit: Payer: Self-pay | Admitting: Family Medicine

## 2015-10-14 ENCOUNTER — Other Ambulatory Visit: Payer: Self-pay | Admitting: Family Medicine

## 2015-10-15 ENCOUNTER — Ambulatory Visit (INDEPENDENT_AMBULATORY_CARE_PROVIDER_SITE_OTHER): Payer: 59 | Admitting: Family Medicine

## 2015-10-15 VITALS — BP 130/84 | HR 95 | Wt 213.0 lb

## 2015-10-15 DIAGNOSIS — R635 Abnormal weight gain: Secondary | ICD-10-CM

## 2015-10-15 MED ORDER — PHENTERMINE HCL 37.5 MG PO TABS: 37.5000 mg | ORAL_TABLET | Freq: Every day | ORAL | Status: DC

## 2015-10-15 NOTE — Progress Notes (Signed)
   Subjective:    Patient ID: Jordan Taylor, male    DOB: Mar 26, 1966, 49 y.o.   MRN: [id number]  HPI  Patient is here for blood pressure and weight check. Denies any trouble sleeping, palpitations, or any other medication problems. Patient reports he has been doing more weight training in the gym instead of just cardio. He also states he has been limiting his caloric intake to 2500/day.    Review of Systems     Objective:   Physical Exam        Assessment & Plan:  Patient has not lost weight, he has gained 2 pounds since last visit. A refill for Phentermine will be sent to Provider for review. Patient advised if refill is sent, to schedule a four week nurse visit and keep her upcoming appointment with her PCP. Verbalized understanding, no further questions.

## 2015-11-14 ENCOUNTER — Other Ambulatory Visit: Payer: Self-pay | Admitting: Family Medicine

## 2015-12-06 MED FILL — BENAZEPRIL HCL 20 MG TABLET: 20 | 90 days supply | Qty: 90 | Fill #1

## 2015-12-06 MED FILL — PRAVASTATIN NA 20 MG TAB: 20 | 90 days supply | Qty: 90 | Fill #3

## 2015-12-10 MED FILL — JANUVIA 100 MG TABLET: 100 | 30 days supply | Qty: 30 | Fill #3

## 2015-12-10 MED FILL — XIGDUO XR 10 MG-1,000 MG TA: 10-1000 | 30 days supply | Qty: 30 | Fill #9

## 2016-01-10 MED FILL — glipiZIDE ER 5 MG TB24: 5 | 90 days supply | Qty: 90 | Fill #1

## 2016-01-13 MED FILL — XIGDUO XR 10 MG-1,000 MG TA: 10-1000 | 30 days supply | Qty: 30 | Fill #10

## 2016-01-24 ENCOUNTER — Other Ambulatory Visit: Payer: Self-pay | Admitting: Family Medicine

## 2016-01-24 MED FILL — JANUVIA 100 MG TABLET: 100 | 30 days supply | Qty: 30 | Fill #0

## 2016-01-27 ENCOUNTER — Other Ambulatory Visit: Payer: Self-pay | Admitting: Family Medicine

## 2016-02-03 MED FILL — AMLODIPINE BESYLATE 10 MG T: 10 | 90 days supply | Qty: 90 | Fill #1

## 2016-02-05 MED FILL — SILDENAFIL 20 MG TABLET: 20 | 13 days supply | Qty: 50 | Fill #1

## 2016-02-19 ENCOUNTER — Other Ambulatory Visit: Payer: Self-pay | Admitting: Family Medicine

## 2016-02-19 MED FILL — BENAZEPRIL HCL 20 MG TABLET: 20 | 30 days supply | Qty: 30 | Fill #0

## 2016-02-25 ENCOUNTER — Other Ambulatory Visit: Payer: Self-pay

## 2016-02-25 ENCOUNTER — Ambulatory Visit (INDEPENDENT_AMBULATORY_CARE_PROVIDER_SITE_OTHER): Payer: 59 | Admitting: Family Medicine

## 2016-02-25 ENCOUNTER — Encounter: Payer: Self-pay | Admitting: Family Medicine

## 2016-02-25 VITALS — BP 136/87 | HR 45 | Wt 226.0 lb

## 2016-02-25 DIAGNOSIS — R635 Abnormal weight gain: Secondary | ICD-10-CM

## 2016-02-25 DIAGNOSIS — I10 Essential (primary) hypertension: Secondary | ICD-10-CM

## 2016-02-25 DIAGNOSIS — E119 Type 2 diabetes mellitus without complications: Secondary | ICD-10-CM | POA: Diagnosis not present

## 2016-02-25 DIAGNOSIS — N529 Male erectile dysfunction, unspecified: Secondary | ICD-10-CM | POA: Diagnosis not present

## 2016-02-25 DIAGNOSIS — Z1211 Encounter for screening for malignant neoplasm of colon: Secondary | ICD-10-CM

## 2016-02-25 LAB — POCT GLYCOSYLATED HEMOGLOBIN (HGB A1C): Hemoglobin A1C: 7.5

## 2016-02-25 MED ORDER — SITAGLIPTIN PHOSPHATE 100 MG PO TABS: 100.0000 mg | ORAL_TABLET | Freq: Every day | ORAL | Status: DC

## 2016-02-25 MED ORDER — PHENTERMINE HCL 37.5 MG PO TABS: 37.5000 mg | ORAL_TABLET | Freq: Every day | ORAL | Status: DC

## 2016-02-25 MED ORDER — BENAZEPRIL HCL 20 MG PO TABS: 20.0000 mg | ORAL_TABLET | Freq: Every day | ORAL | Status: DC

## 2016-02-25 MED ORDER — PRAVASTATIN SODIUM 20 MG PO TABS: 20.0000 mg | ORAL_TABLET | Freq: Every evening | ORAL | Status: DC

## 2016-02-25 MED ORDER — DAPAGLIFLOZIN PRO-METFORMIN ER 10-1000 MG PO TB24: 1.0000 | ORAL_TABLET | Freq: Every day | ORAL | Status: DC

## 2016-02-25 MED ORDER — GLIPIZIDE ER 5 MG PO TB24: 5.0000 mg | ORAL_TABLET | Freq: Every day | ORAL | Status: DC

## 2016-02-25 MED ORDER — SILDENAFIL CITRATE 100 MG PO TABS: 50.0000 mg | ORAL_TABLET | Freq: Every day | ORAL | Status: DC | PRN

## 2016-02-25 MED ORDER — AMLODIPINE BESYLATE 5 MG PO TABS: 5.0000 mg | ORAL_TABLET | Freq: Every day | ORAL | Status: DC

## 2016-02-25 MED FILL — PHENTERMINE 37.5 MG TABLET: 37.5 | 30 days supply | Qty: 30 | Fill #0

## 2016-02-25 MED FILL — JANUVIA 100 MG TABLET: 100 | 30 days supply | Qty: 30 | Fill #0

## 2016-02-25 MED FILL — XIGDUO XR 10 MG-1,000 MG TA: 10-1000 | 30 days supply | Qty: 30 | Fill #0

## 2016-02-25 MED FILL — AMLODIPINE BESYLATE 5 MG TA: 5 | 90 days supply | Qty: 90 | Fill #0

## 2016-02-25 MED FILL — VIAGRA 100 MG TABLET: 100 | 30 days supply | Qty: 6 | Fill #0

## 2016-02-25 NOTE — Progress Notes (Signed)
CC: Jordan Taylor is a 50 y.o. male is here for Hyperglycemia and Hypertension   Subjective: HPI:  Follow-up essential hypertension: Taking amlodipine, and benazepril on a daily basis with no known side effects. No outside blood pressures report. Denies chest pain shortness of breath orthopnea nor peripheral edema.  Follow type 2 diabetes: No outside blood sugars to report. He tells me he's been slacking in the gym a little bit since I saw him last but continues to takeXigduo, Januvia and Glipizide on a daily basis. No polyuria polyphagia or polydipsia.  He was noted to have a refill on phentermine. He was taking it intermittently since I saw him last 2 to forgetfulness.  He has a history of erectile dysfunction and would like refills on Viagra. He's heard that low testosterone could cause this and would like to know if you have this checked  [name] due for a colonoscopy and would like to know if there something else that he can have done to screen for colon cancer that is not as invasive. Denies any rectal bleeding or abdominal pain.   Review Of Systems Outlined In HPI  Past Medical History  Diagnosis Date  . Type 2 diabetes mellitus (HCC) 12/04/2013  . Essential hypertension 12/04/2013    Past Surgical History  Procedure Laterality Date  . Spinal cord surgery     Family History  Problem Relation Age of Onset  . Alcohol abuse Mother   . Alcohol abuse Father   . Diabetes Father   . Hypertension Father   . Cancer Other     Social History   Social History  . Marital Status: Married    Spouse Name: [name]  . Number of Children: N/A  . Years of Education: N/A   Occupational History  . [employer] Sports coachManager  [employer]   Social History Main Topics  . Smoking status: Former Games developermoker  . Smokeless tobacco: Never Used  . Alcohol Use: No  . Drug Use: No  . Sexual Activity:    Partners: Female   Other Topics Concern  . Not on file   Social History Narrative      Objective: BP 136/87 mmHg  Pulse 45  Wt 226 lb (102.513 kg)  General: Alert and Oriented, No Acute Distress HEENT: Pupils equal, round, reactive to light. Conjunctivae clear.  Moist mucous membranes Lungs: Clear to auscultation bilaterally, no wheezing/ronchi/rales.  Comfortable work of breathing. Good air movement. Cardiac: Regular rate and rhythm. Normal S1/S2.  No murmurs, rubs, nor gallops.   Extremities: No peripheral edema.  Strong peripheral pulses.  Mental Status: No depression, anxiety, nor agitation. Skin: Warm and dry.  Assessment & Plan: Jordan Taylor was seen today for hyperglycemia and hypertension.  Diagnoses and all orders for this visit:  Essential hypertension -     amLODipine (NORVASC) 5 MG tablet; Take 1 tablet (5 mg total) by mouth daily.  Type 2 diabetes mellitus without complication, without long-term current use of insulin (HCC) -     glipiZIDE (GLUCOTROL XL) 5 MG 24 hr tablet; Take 1 tablet (5 mg total) by mouth daily with breakfast. -     Ambulatory referral to Podiatry -     POCT HgB A1C  Abnormal weight gain -     phentermine (ADIPEX-P) 37.5 MG tablet; Take 1 tablet (37.5 mg total) by mouth daily before breakfast.  Erectile dysfunction, unspecified erectile dysfunction type -     Testosterone  Special screening for malignant neoplasms, colon  Other orders -  benazepril (LOTENSIN) 20 MG tablet; Take 1 tablet (20 mg total) by mouth daily. -     Dapagliflozin-Metformin HCl ER 08-999 MG TB24; Take 1 tablet by mouth daily. -     Discontinue: pravastatin (PRAVACHOL) 20 MG tablet; Take 1 tablet (20 mg total) by mouth every evening. -     Cancel: sildenafil (REVATIO) 20 MG tablet; 1-4 tabs by mouth daily only as needed for sex -     sitaGLIPtin (JANUVIA) 100 MG tablet; Take 1 tablet (100 mg total) by mouth daily. -     sildenafil (VIAGRA) 100 MG tablet; Take 0.5-1 tablets (50-100 mg total) by mouth daily as needed for erectile dysfunction.   Essential  hypertension: Controlled with amlodipine and benazepril. Type 2 diabetes: A1c of 7.5, he is optimistic that he can get this back down below 7 with dietary changes and increasing his exercise routine. No changes to current antihyperglycemic regimen. Abnormal weight gain: Refill of phentermine but warned that this will not be able to be refilled if he does not lose weight while taking it. Erectile dysfunction: Refills on Viagra, his shin about testosterone seems reasonable. Colon cancer screening: Discussed risks and benefits of using Cologuard to screen for colon cancer relative to the gold standard of colonoscopy, he would like to proceed with Cologuard, order will be faxed.  Return in about 3 months (around 05/27/2016) for sugar.

## 2016-02-26 ENCOUNTER — Telehealth: Payer: Self-pay | Admitting: Family Medicine

## 2016-02-26 ENCOUNTER — Encounter: Payer: Self-pay | Admitting: Family Medicine

## 2016-02-26 DIAGNOSIS — E291 Testicular hypofunction: Secondary | ICD-10-CM

## 2016-02-26 LAB — TESTOSTERONE: Testosterone: 262 ng/dL (ref 250–827)

## 2016-02-26 MED ORDER — LINAGLIPTIN 5 MG PO TABS: 5.0000 mg | ORAL_TABLET | Freq: Every day | ORAL | Status: DC

## 2016-02-26 MED FILL — TRADJENTA 5 MG TABLET: 5 | 30 days supply | Qty: 30 | Fill #0

## 2016-02-26 NOTE — Telephone Encounter (Signed)
Pt.notified

## 2016-02-26 NOTE — Telephone Encounter (Signed)
Pt called to inform us that his insurance will cover in office testosterone injections.

## 2016-02-26 NOTE — Telephone Encounter (Signed)
I sent a rx for a medication called [facility]jenta [facility] [place] to see if this is covered in place of [place]. He may want to pick up a savings voucher here before going to try and pick it up.

## 2016-02-26 NOTE — Telephone Encounter (Signed)
Pt notified.  Pt stated that Jordan Taylor is no longer covered under his insurance and would like to know is there an alternative medication he could take?

## 2016-02-26 NOTE — Telephone Encounter (Signed)
Will you please let patient know that his testosterone level was in the deficient range and if he's interested in pursuing testosterone replacement therapy I'd recommend he have his level rechecked in one week along with a PSA blood test.  Lab slip in your in box, again wait one week before going to the lab for this.

## 2016-03-03 ENCOUNTER — Ambulatory Visit: Payer: 59 | Admitting: Podiatry

## 2016-03-03 MED FILL — PRAVASTATIN NA 20 MG TAB: 20 | 90 days supply | Qty: 90 | Fill #0

## 2016-03-04 DIAGNOSIS — E291 Testicular hypofunction: Secondary | ICD-10-CM | POA: Diagnosis not present

## 2016-03-04 LAB — HEMOGLOBIN: Hemoglobin: 15.7 g/dL (ref 13.2–17.1)

## 2016-03-05 LAB — TESTOSTERONE: Testosterone: 296 ng/dL (ref 250–827)

## 2016-03-05 LAB — PSA: PSA: 2.09 ng/mL (ref <=4.00)

## 2016-03-05 NOTE — Addendum Note (Signed)
Addended by: Laren BoomHOMMEL, Neylan Koroma on: 03/05/2016 10:07 AM   Modules accepted: Orders

## 2016-03-09 ENCOUNTER — Ambulatory Visit (INDEPENDENT_AMBULATORY_CARE_PROVIDER_SITE_OTHER): Payer: 59 | Admitting: Family Medicine

## 2016-03-09 ENCOUNTER — Encounter: Payer: Self-pay | Admitting: Family Medicine

## 2016-03-09 VITALS — BP 114/67 | HR 76

## 2016-03-09 DIAGNOSIS — E291 Testicular hypofunction: Secondary | ICD-10-CM

## 2016-03-09 MED ORDER — TESTOSTERONE CYPIONATE 200 MG/ML IM SOLN
200.0000 mg | Freq: Once | INTRAMUSCULAR | Status: AC
  Administered 2016-03-09: 200 mg via INTRAMUSCULAR

## 2016-03-09 NOTE — Progress Notes (Signed)
   Subjective:    Patient ID: Jordan Taylor, male    DOB: 10-19-1966, 50 y.o.   MRN: [id number]  HPI   Patient is here for initial start of testosterone injection . Pt instructed to call the office if he experiences any side effects such as headache, blurred vision, elevated BP, dizziness. Injection administered without complication.    Review of Systems     Objective:   Physical Exam        Assessment & Plan:  The patient is aware to return for his next injection in 3 weeks (21day) per Dr. Genelle BalHommel's last progress note.

## 2016-03-25 LAB — COLOGUARD

## 2016-03-25 MED FILL — XIGDUO XR 10 MG-1,000 MG TA: 10-1000 | 30 days supply | Qty: 30 | Fill #1

## 2016-03-30 MED FILL — glipiZIDE ER 5 MG TB24: 5 | 90 days supply | Qty: 90 | Fill #0

## 2016-03-30 MED FILL — JANUVIA 100 MG TABLET: 100 | 30 days supply | Qty: 30 | Fill #1

## 2016-03-30 MED FILL — BENAZEPRIL HCL 20 MG TABLET: 20 | 30 days supply | Qty: 30 | Fill #0

## 2016-03-31 ENCOUNTER — Encounter: Payer: Self-pay | Admitting: Podiatry

## 2016-03-31 ENCOUNTER — Other Ambulatory Visit: Payer: Self-pay

## 2016-03-31 ENCOUNTER — Ambulatory Visit (INDEPENDENT_AMBULATORY_CARE_PROVIDER_SITE_OTHER): Payer: 59 | Admitting: Podiatry

## 2016-03-31 VITALS — BP 131/77 | HR 99 | Ht 66.0 in | Wt 222.0 lb

## 2016-03-31 DIAGNOSIS — R202 Paresthesia of skin: Secondary | ICD-10-CM | POA: Diagnosis not present

## 2016-03-31 DIAGNOSIS — M216X9 Other acquired deformities of unspecified foot: Secondary | ICD-10-CM | POA: Diagnosis not present

## 2016-03-31 DIAGNOSIS — M7740 Metatarsalgia, unspecified foot: Secondary | ICD-10-CM

## 2016-03-31 DIAGNOSIS — M7741 Metatarsalgia, right foot: Secondary | ICD-10-CM

## 2016-03-31 DIAGNOSIS — M216X2 Other acquired deformities of left foot: Secondary | ICD-10-CM

## 2016-03-31 DIAGNOSIS — M7742 Metatarsalgia, left foot: Principal | ICD-10-CM

## 2016-03-31 DIAGNOSIS — M79673 Pain in unspecified foot: Secondary | ICD-10-CM

## 2016-03-31 DIAGNOSIS — M216X1 Other acquired deformities of right foot: Secondary | ICD-10-CM

## 2016-03-31 NOTE — Patient Instructions (Signed)
Seen for diabetic foot with tingling problem. Noted of tight Achilles tendon and abnormal weight shifting to lateral column. Reviewed stretch exercise to do daily. Will benefit from custom orthotics.

## 2016-03-31 NOTE — Progress Notes (Signed)
Tingling on feet at times x 1 year.  Been diabetic x 5 years. Blood sugar is 130-140. Little high in Triglyceride level. At work, on [employer] all day.  SUBJECTIVE: 50 y.o. year old male presents for diabetic foot care. Stated that both feet have tingling sensations off and on for the past one year. He works at [employer] and on [employer] all day. Was diagnosed with Diabetic for the past 5 years and blood sugar runs between 130 and 140.  Patient is referred by Dr. Ivan AnchorsHommel.   REVIEW OF SYSTEMS: A comprehensive review of systems was negative except for: chief complaints.   OBJECTIVE: DERMATOLOGIC EXAMINATION: No abnormal acute lesions. Noted of hard callused skin under the balls of both feet 2-4th MPJ plantar bilateral.   VASCULAR EXAMINATION OF LOWER LIMBS: Pedal pulses: All pedal pulses are palpable with normal pulsation.  Temperature gradient from tibial crest to dorsum of foot is within normal bilateral.  NEUROLOGIC EXAMINATION OF THE LOWER LIMBS: Achilles DTR is present and within normal. Monofilament (Semmes-Weinstein 10-gm) sensory testing positive 6 out of 6, bilateral. Vibratory sensations(128Hz  turning fork) intact at medial and lateral forefoot bilateral.  Sharp and Dull discriminatory sensations at the plantar ball of hallux is intact bilateral.   MUSCULOSKELETAL EXAMINATION: Positive for tight Achilles tendon bilateral. Positive of forefoot varus with elevated first ray bilateral.   RADIOGRAPHIC STUDIES:  General overview: Negative of Osteopenia Absence of soft tissue swelling. Absence of acute changes in osseous or articular surfaces of forefoot or rearfoot.  AP View:  Short first metatarsal, adducted metatarsal bones, increased lateral deviation angle of CC joint bilateral.  Lateral view:  Supinated high arched cavus type foot and positive of posterior Os Trigonum bilateral.   ASSESSMENT: Ankle Equinus bilateral. Compensated rearfoot varus with lateral weight shifting.    Forefoot varus bilateral. Paresthesia bilateral. Type II NIDDM.  PLAN: Reviewed clinical findings and available treatment options. May benefit from diabetic shoes to accommodate extra wide forefoot.  Reviewed stretch exercise for tight Achilles tendon. Reviewed benefit of custom orthotics.  Both feet measured for custom orthotics.

## 2016-04-01 ENCOUNTER — Ambulatory Visit (INDEPENDENT_AMBULATORY_CARE_PROVIDER_SITE_OTHER): Payer: 59 | Admitting: Family Medicine

## 2016-04-01 ENCOUNTER — Encounter: Payer: Self-pay | Admitting: Family Medicine

## 2016-04-01 VITALS — BP 114/82 | HR 92 | Wt 217.0 lb

## 2016-04-01 DIAGNOSIS — E291 Testicular hypofunction: Secondary | ICD-10-CM

## 2016-04-01 DIAGNOSIS — R635 Abnormal weight gain: Secondary | ICD-10-CM

## 2016-04-01 MED ORDER — PHENTERMINE HCL 37.5 MG PO TABS: 37.5000 mg | ORAL_TABLET | Freq: Every day | ORAL | Status: DC

## 2016-04-01 MED ORDER — TESTOSTERONE CYPIONATE 200 MG/ML IM SOLN
200.0000 mg | Freq: Once | INTRAMUSCULAR | Status: AC
  Administered 2016-04-01: 200 mg via INTRAMUSCULAR

## 2016-04-01 MED FILL — PHENTERMINE 37.5 MG TABLET: 37.5 | 30 days supply | Qty: 30 | Fill #0

## 2016-04-01 NOTE — Progress Notes (Signed)
Patient is here for blood pressure and weight check. Denies any trouble sleeping, palpitations, or any other medication problems. Patient has lost weight. A refill for Phentermine will be sent to patient preferred pharmacy. Patient advised to schedule a four week nurse visit and keep upcoming appointment with PCP. Verbalized understanding, no further questions.  Patient also came into office today for testosterone injection. Denies chest pain, shortness of breath, headaches and problems associated with taking this medication. Patient states he has had no abnornal mood swings. Patient tolerated injection in ROUQ well without complications. Patient advised to schedule his next injection for 3 weeks from today.

## 2016-04-03 MED ORDER — LORATADINE-PSEUDOEPHEDRINE ER 10-240 MG PO TB24: 1.0000 | ORAL_TABLET | Freq: Every day | ORAL | Status: DC

## 2016-04-03 MED FILL — ALLERGY RELIEF-NASAL DECONG: 10-240 | 30 days supply | Qty: 30 | Fill #0

## 2016-04-07 ENCOUNTER — Encounter: Payer: Self-pay | Admitting: Family Medicine

## 2016-04-22 ENCOUNTER — Encounter: Payer: Self-pay | Admitting: Family Medicine

## 2016-04-24 MED FILL — XIGDUO XR 10 MG-1,000 MG TA: 10-1000 | 30 days supply | Qty: 30 | Fill #2

## 2016-05-13 ENCOUNTER — Ambulatory Visit (INDEPENDENT_AMBULATORY_CARE_PROVIDER_SITE_OTHER): Payer: 59 | Admitting: Sports Medicine

## 2016-05-13 VITALS — BP 113/76 | HR 97 | Wt 210.0 lb

## 2016-05-13 DIAGNOSIS — E291 Testicular hypofunction: Secondary | ICD-10-CM | POA: Diagnosis not present

## 2016-05-13 DIAGNOSIS — R635 Abnormal weight gain: Secondary | ICD-10-CM | POA: Diagnosis not present

## 2016-05-13 MED ORDER — TESTOSTERONE CYPIONATE 200 MG/ML IM SOLN
200.0000 mg | Freq: Once | INTRAMUSCULAR | Status: AC
  Administered 2016-05-13: 200 mg via INTRAMUSCULAR

## 2016-05-13 MED ORDER — PHENTERMINE HCL 37.5 MG PO TABS: 37.5000 mg | ORAL_TABLET | Freq: Every day | ORAL | Status: DC

## 2016-05-13 MED FILL — PHENTERMINE 37.5 MG TABLET: 37.5 | 30 days supply | Qty: 30 | Fill #0

## 2016-05-13 NOTE — Progress Notes (Signed)
Haron presents to clinic for a testosterone injection, weight and BP check. Denies any heart palpitations, trouble sleeping and chest pain. Pt lost 7 lbs and would like a refill on phentermine.   Pt denies changes in mood, headache, and SOB associated with testosterone injection. Pt tolerated injection in LOUQ well without any complications. -[facility]

## 2016-05-13 NOTE — Patient Instructions (Signed)
Pt advised to make next appointment in 3 weeks.

## 2016-05-15 ENCOUNTER — Ambulatory Visit (INDEPENDENT_AMBULATORY_CARE_PROVIDER_SITE_OTHER): Payer: 59 | Admitting: Osteopathic Medicine

## 2016-05-15 ENCOUNTER — Encounter: Payer: Self-pay | Admitting: Osteopathic Medicine

## 2016-05-15 VITALS — BP 121/72 | HR 81 | Wt 211.0 lb

## 2016-05-15 DIAGNOSIS — H109 Unspecified conjunctivitis: Secondary | ICD-10-CM | POA: Diagnosis not present

## 2016-05-15 MED ORDER — POLYMYXIN B-TRIMETHOPRIM 10000-0.1 UNIT/ML-% OP SOLN: 2.0000 [drp] | Freq: Four times a day (QID) | OPHTHALMIC | Status: DC

## 2016-05-15 MED FILL — POLYMYXIN B/TMP EYE DROPS: 10000-0.1 | 25 days supply | Qty: 10 | Fill #0

## 2016-05-15 NOTE — Progress Notes (Signed)
HPI: Jordan Taylor is a 50 y.o. male who [name] today for chief complaint of:  Chief Complaint  Patient presents with  . Conjunctivitis    bilateral, x 1 day itchy,watery,swelling. he has been using visine     . Location: both eyes . Quality: itching, red eyes . Duration: 1 day . Modifying factors: uncertain of any exposure, works [employer], exposure to chemicals and sick contacts possible  Past medical, social and family history reviewed: Past Medical History  Diagnosis Date  . Type 2 diabetes mellitus (HCC) 12/04/2013  . Essential hypertension 12/04/2013   Past Surgical History  Procedure Laterality Date  . Spinal cord surgery     Social History  Substance Use Topics  . Smoking status: Former Games developermoker  . Smokeless tobacco: Never Used  . Alcohol Use: No   Family History  Problem Relation Age of Onset  . Alcohol abuse Mother   . Alcohol abuse Father   . Diabetes Father   . Hypertension Father   . Cancer Other     Current Outpatient Prescriptions  Medication Sig Dispense Refill  . amLODipine (NORVASC) 5 MG tablet Take 1 tablet (5 mg total) by mouth daily. 90 tablet 1  . benazepril (LOTENSIN) 20 MG tablet Take 1 tablet (20 mg total) by mouth daily. 30 tablet 0  . Dapagliflozin-Metformin HCl ER 08-999 MG TB24 Take 1 tablet by mouth daily. 30 tablet 11  . glipiZIDE (GLUCOTROL XL) 5 MG 24 hr tablet Take 1 tablet (5 mg total) by mouth daily with breakfast. 90 tablet 1  . linagliptin (TRADJENTA) 5 MG TABS tablet Take 1 tablet (5 mg total) by mouth daily. 30 tablet 11  . loratadine-pseudoephedrine (CLARITIN-D 24 HOUR) 10-240 MG 24 hr tablet Take 1 tablet by mouth daily. 30 tablet 2  . phentermine (ADIPEX-P) 37.5 MG tablet Take 1 tablet (37.5 mg total) by mouth daily before breakfast. 30 tablet 0  . pravastatin (PRAVACHOL) 20 MG tablet Take 1 tablet (20 mg total) by mouth every evening. 30 tablet 11  . sildenafil (REVATIO)  20 MG tablet 1-4 tabs by mouth daily only as needed for sex 50 tablet 4  . sildenafil (VIAGRA) 100 MG tablet Take 0.5-1 tablets (50-100 mg total) by mouth daily as needed for erectile dysfunction. 6 tablet 11  . testosterone cypionate (DEPO-TESTOSTERONE) 200 MG/ML injection Inject 1 mL (200 mg total) into the muscle every 21 ( twenty-one) days. 10 mL 0   No current facility-administered medications for this visit.   No Known Allergies    Review of Systems: CONSTITUTIONAL:  No  fever, no chills, No recent illness, No unintentional weight changes HEAD/EYES/EARS/NOSE/THROAT: (+) eye redness/itching/pain/discharge. No  headache, no vision change, no hearing change, No sore throat, No  sinus pressure CARDIAC: No  chest pain,  GASTROINTESTINAL: No  nausea,  NEUROLOGIC: No  weakness, No  Dizziness,  Exam:  BP 121/72 mmHg  Pulse 81  Wt 211 lb (95.709 kg) Constitutional: VS see above. General Appearance: alert, well-developed, well-nourished, NAD Eyes: Normal lids, (+) injected conjunctive, non-icteric sclera, PERRLA, EOMI nonpainful, (+) thick yellowish discharge bilaterally, no noted abscess or cellulitis of surrounding lids/skin Ears, Nose, Mouth, Throat: MMM, Normal external inspection ears/nares/mouth/lips/gums,  Skin: warm, dry, intact. No rash/ulcer. No concerning nevi or subq nodules on limited exam.   Psychiatric: Normal judgment/insight. Normal mood and affect. Oriented x3.    No results found for this or any previous visit (from the past 72 hour(s)).  No results found.   ASSESSMENT/PLAN: Warning signs reviewed - pain with eye movement, redness/pain in skin, change in vision, other concerns. Given purulent-appearing discharge and severe irritation think reasonable to cover with abx.   Bilateral conjunctivitis - Plan: trimethoprim-polymyxin b (POLYTRIM) ophthalmic solution    All questions were answered. Visit summary with medication list and pertinent instructions was printed  for patient to review. ER/RTC precautions were reviewed with the patient. Return if symptoms worsen or fail to improve.

## 2016-05-15 NOTE — Patient Instructions (Signed)

## 2016-05-19 DIAGNOSIS — Z1212 Encounter for screening for malignant neoplasm of rectum: Secondary | ICD-10-CM | POA: Diagnosis not present

## 2016-05-19 DIAGNOSIS — Z1211 Encounter for screening for malignant neoplasm of colon: Secondary | ICD-10-CM | POA: Diagnosis not present

## 2016-05-21 ENCOUNTER — Other Ambulatory Visit: Payer: Self-pay | Admitting: Family Medicine

## 2016-05-21 MED FILL — VIAGRA 100 MG TABLET: 100 | 30 days supply | Qty: 6 | Fill #1

## 2016-05-21 MED FILL — BENAZEPRIL HCL 20 MG TABLET: 20 | 30 days supply | Qty: 30 | Fill #0

## 2016-05-28 MED FILL — XIGDUO XR 10 MG-1,000 MG TA: 10-1000 | 30 days supply | Qty: 30 | Fill #3

## 2016-06-03 ENCOUNTER — Encounter: Payer: Self-pay | Admitting: Family Medicine

## 2016-06-03 ENCOUNTER — Ambulatory Visit (INDEPENDENT_AMBULATORY_CARE_PROVIDER_SITE_OTHER): Payer: 59 | Admitting: Family Medicine

## 2016-06-03 VITALS — BP 131/84 | HR 94 | Wt 209.0 lb

## 2016-06-03 DIAGNOSIS — R29898 Other symptoms and signs involving the musculoskeletal system: Secondary | ICD-10-CM | POA: Diagnosis not present

## 2016-06-03 DIAGNOSIS — R635 Abnormal weight gain: Secondary | ICD-10-CM

## 2016-06-03 DIAGNOSIS — N529 Male erectile dysfunction, unspecified: Secondary | ICD-10-CM

## 2016-06-03 DIAGNOSIS — E119 Type 2 diabetes mellitus without complications: Secondary | ICD-10-CM

## 2016-06-03 DIAGNOSIS — E291 Testicular hypofunction: Secondary | ICD-10-CM

## 2016-06-03 DIAGNOSIS — I10 Essential (primary) hypertension: Secondary | ICD-10-CM

## 2016-06-03 MED ORDER — PHENTERMINE HCL 37.5 MG PO TABS: 37.5000 mg | ORAL_TABLET | Freq: Every day | ORAL | Status: DC

## 2016-06-03 MED ORDER — TESTOSTERONE CYPIONATE 200 MG/ML IM SOLN
200.0000 mg | Freq: Once | INTRAMUSCULAR | Status: AC
  Administered 2016-06-03: 200 mg via INTRAMUSCULAR

## 2016-06-03 MED ORDER — BENAZEPRIL HCL 20 MG PO TABS: ORAL_TABLET | ORAL | Status: DC

## 2016-06-03 NOTE — Addendum Note (Signed)
Addended by: Thom ChimesHENRY, [place]mun[place] Stegemann M on: 06/03/2016 08:50 AM   Modules accepted: Orders

## 2016-06-03 NOTE — Progress Notes (Signed)
CC: Jordan Taylor is a 50 y.o. male is here for Medication Refill and Hypogonadism   Subjective: HPI:  Follow-up type 2 diabetes: No outside blood sugars to report. He is taking glipizide, dapagilflozin-metformin, and tradjenta with no known side effects. Denies polyuria aphasia or polydipsia. He's noticed a new motor disturbance described as a weakness in his left upper extremity that is most notable in the hands. He has difficulty extending all of his fingers completely. Symptoms came on the day after he was lifting heavy boxes at work. He denies any numbness or any sensory disturbances in the upper extremities. He denies any new neck pain.  Follow-up essential hypertension: Is requesting a refill on benazepril. No outside blood pressures to report. No chest pain shortness of breath orthopnea nor peripheral edema  Follow-up hypogonadism: It's been about 3 months now since he started testosterone injection replacement therapy. He believes is helping he's now having spontaneous nocturnal erections and he denies any known side effects. He still having difficulty keeping an erection when having intercourse and would like to see a urologist.  Follow-up obesity: He is still being successful phentermine. He is requesting a refill. He denies any anxiety or sleep disturbance.   Review Of Systems Outlined In HPI  Past Medical History  Diagnosis Date  . Type 2 diabetes mellitus (HCC) 12/04/2013  . Essential hypertension 12/04/2013    Past Surgical History  Procedure Laterality Date  . Spinal cord surgery     Family History  Problem Relation Age of Onset  . Alcohol abuse Mother   . Alcohol abuse Father   . Diabetes Father   . Hypertension Father   . Cancer Other     Social History   Social History  . Marital Status: Married    Spouse Name: Jordan Taylor  . Number of Children: N/A  . Years of Education: N/A   Occupational History  . [employer]   Social  History Main Topics  . Smoking status: Former Games developermoker  . Smokeless tobacco: Never Used  . Alcohol Use: No  . Drug Use: No  . Sexual Activity:    Partners: Female   Other Topics Concern  . Not on file   Social History Narrative     Objective: BP 131/84 mmHg  Pulse 94  Wt 209 lb (94.802 kg)  General: Alert and Oriented, No Acute Distress HEENT: Pupils equal, round, reactive to light. Conjunctivae clear. Moist mucous membranes  Lungs: Clear to auscultation bilaterally, no wheezing/ronchi/rales.  Comfortable work of breathing. Good air movement. Cardiac: Regular rate and rhythm. Normal S1/S2.  No murmurs, rubs, nor gallops.   Neuro: Cranial nerves II through XII grossly intact. Full range of motion and strength in the elbow however distally he has 4 out of 5 strength with extension of the wrist and also extension of the fingers. Questionable muscle atrophy in the forearm.  Extremities: No peripheral edema.  Strong peripheral pulses.  Mental Status: No depression, anxiety, nor agitation. Skin: Warm and dry.  Assessment & Plan: Jordan Taylor was seen today for medication refill and hypogonadism.  Diagnoses and all orders for this visit:  Type 2 diabetes mellitus without complication, without long-term current use of insulin (HCC) -     Hemoglobin A1c  Essential hypertension -     Hemoglobin  Hypogonadism in male -     Testosterone  Left arm weakness -     Ambulatory referral to Neurology  Erectile dysfunction, unspecified erectile dysfunction type -  Ambulatory referral to Urology  Abnormal weight gain -     phentermine (ADIPEX-P) 37.5 MG tablet; Take 1 tablet (37.5 mg total) by mouth daily before breakfast.  Other orders -     benazepril (LOTENSIN) 20 MG tablet; TAKE 1 TABLET (20 MG TOTAL) BY MOUTH DAILY.   Type 2 diabetes: Clinically controlled due for an A1c, however this done next week with blood work described below. Continue all antihyperglycemic medications  pending this result Left arm weakness: Suspect pinched nerve not sure it is coming from the neck or elbow therefore sending to neurology for consideration of nerve conduction study Essential hypertension: Controlled continue benazepril and amlodipine Hypogonadism: Clinically improving however he is due for a testosterone level and hemoglobin check. Erectile dysfunction: Improving but uncontrolled referral to urology per his request Weight gain: Improving due for phentermine refill next week, will provide him the hard copy today since he is already here. No suspicion for misuse.  Return in about 3 months (around 09/03/2016) for sugar.

## 2016-06-05 LAB — COLOGUARD: Cologuard: NEGATIVE

## 2016-06-08 ENCOUNTER — Telehealth: Payer: Self-pay | Admitting: Family Medicine

## 2016-06-08 MED FILL — TRADJENTA 5 MG TABLET: 5 | 30 days supply | Qty: 30 | Fill #1

## 2016-06-08 NOTE — Telephone Encounter (Signed)
Will you please let patient know that his cologuard test was normal and reassuring.  I'd recommend he repeat this in three years.

## 2016-06-08 NOTE — Telephone Encounter (Signed)
Notified patient.

## 2016-06-10 DIAGNOSIS — E119 Type 2 diabetes mellitus without complications: Secondary | ICD-10-CM | POA: Diagnosis not present

## 2016-06-10 DIAGNOSIS — E291 Testicular hypofunction: Secondary | ICD-10-CM | POA: Diagnosis not present

## 2016-06-10 DIAGNOSIS — I10 Essential (primary) hypertension: Secondary | ICD-10-CM | POA: Diagnosis not present

## 2016-06-11 ENCOUNTER — Encounter: Payer: Self-pay | Admitting: Family Medicine

## 2016-06-11 LAB — TESTOSTERONE: Testosterone: 739 ng/dL (ref 250–827)

## 2016-06-11 LAB — HEMOGLOBIN A1C
Hgb A1c MFr Bld: 7.6 % — ABNORMAL HIGH (ref <5.7)
Mean Plasma Glucose: 171 mg/dL

## 2016-06-11 LAB — HEMOGLOBIN: Hemoglobin: 15.2 g/dL (ref 13.2–17.1)

## 2016-06-12 ENCOUNTER — Encounter: Payer: Self-pay | Admitting: Family Medicine

## 2016-06-12 MED FILL — PHENTERMINE 37.5 MG TABLET: 37.5 | 30 days supply | Qty: 30 | Fill #0

## 2016-06-15 ENCOUNTER — Ambulatory Visit: Payer: 59 | Admitting: Podiatry

## 2016-06-17 ENCOUNTER — Encounter: Payer: Self-pay | Admitting: Family Medicine

## 2016-06-22 MED FILL — BENAZEPRIL HCL 20 MG TABLET: 20 | 90 days supply | Qty: 90 | Fill #0

## 2016-06-23 MED FILL — SILDENAFIL 20 MG TABLET: 20 | 13 days supply | Qty: 50 | Fill #2

## 2016-06-24 ENCOUNTER — Encounter: Payer: Self-pay | Admitting: Neurology

## 2016-06-24 ENCOUNTER — Ambulatory Visit (INDEPENDENT_AMBULATORY_CARE_PROVIDER_SITE_OTHER): Payer: 59 | Admitting: Neurology

## 2016-06-24 ENCOUNTER — Telehealth: Payer: Self-pay | Admitting: Neurology

## 2016-06-24 VITALS — BP 140/89 | HR 89 | Ht 65.0 in | Wt 215.0 lb

## 2016-06-24 DIAGNOSIS — R29898 Other symptoms and signs involving the musculoskeletal system: Secondary | ICD-10-CM | POA: Diagnosis not present

## 2016-06-24 NOTE — Telephone Encounter (Signed)
Patient returned Jordan Taylor's call. Please call [phone].

## 2016-06-24 NOTE — Progress Notes (Signed)
Reason for visit: Left arm weakness  Referring physician: Dr. Marliss Coots is a 50 y.o. male  History of present illness:  Jordan Taylor is a 50 year old right-handed black male with a history of onset of paresthesias affecting the right arm that developed in November 2016 unassociated with weakness. The patient gives a history of cervical spine surgery at the C4 through C6 levels done over 20 years ago, he indicates that he had some residual left arm weakness following the surgery. He also indicates that he had lumbosacral spine surgery as well. The patient however began noting increasing problems with left arm strength associated with paresthesias that began in or around March 2017. The weakness of the left arm has gradually worsened, he is having difficulty with extension of the fingers and weakness of the left triceps muscle. The patient denies any neck pain, but he does have paresthesias bilaterally from the shoulders down to the hands. He denies paresthesias in the lower extremities. Head and neck movements do not worsen the clinical symptoms. He denies any issues controlling the bowels or the bladder. He may occasionally note some twitching of the muscles of the left arm. He is slowly losing the use of the left hand. He is sent to this office for an evaluation.  Past Medical History:  Diagnosis Date  . Essential hypertension 12/04/2013  . Type 2 diabetes mellitus (HCC) 12/04/2013    Past Surgical History:  Procedure Laterality Date  . KNEE ARTHROSCOPY Left   . spinal cord surgery     20+ yrs ago    Family History  Problem Relation Age of Onset  . Alcohol abuse Mother   . Cancer Mother   . Alcohol abuse Father   . Diabetes Father   . Hypertension Father   . Cancer Other     Social history:  reports that he has quit smoking. He has never used smokeless tobacco. He reports that he does not drink alcohol or use drugs.  Medications:  Prior to Admission medications    Medication Sig Start Date End Date Taking? Authorizing Provider  amLODipine (NORVASC) 5 MG tablet Take 1 tablet (5 mg total) by mouth daily. 02/25/16   Jordan Hommel, DO  benazepril (LOTENSIN) 20 MG tablet TAKE 1 TABLET (20 MG TOTAL) BY MOUTH DAILY. 06/03/16   Jordan Boom, DO  Dapagliflozin-Metformin HCl ER 08-999 MG TB24 Take 1 tablet by mouth daily. 02/25/16   Jordan Hommel, DO  glipiZIDE (GLUCOTROL XL) 5 MG 24 hr tablet Take 1 tablet (5 mg total) by mouth daily with breakfast. 02/25/16   Jordan Boom, DO  linagliptin (TRADJENTA) 5 MG TABS tablet Take 1 tablet (5 mg total) by mouth daily. 02/26/16   Jordan Hommel, DO  loratadine-pseudoephedrine (CLARITIN-D 24 HOUR) 10-240 MG 24 hr tablet Take 1 tablet by mouth daily. 04/03/16   Jordan Boom, DO  phentermine (ADIPEX-P) 37.5 MG tablet Take 1 tablet (37.5 mg total) by mouth daily before breakfast. 06/03/16   Jordan Boom, DO  pravastatin (PRAVACHOL) 20 MG tablet Take 1 tablet (20 mg total) by mouth every evening. 02/25/16   Jordan Boom, DO  sildenafil (REVATIO) 20 MG tablet 1-4 tabs by mouth daily only as needed for sex 07/02/15   Jordan Boom, DO  sildenafil (VIAGRA) 100 MG tablet Take 0.5-1 tablets (50-100 mg total) by mouth daily as needed for erectile dysfunction. 02/25/16   Jordan Boom, DO  testosterone cypionate (DEPO-TESTOSTERONE) 200 MG/ML injection Inject 1 mL (200 mg total) into the  muscle every 21 ( twenty-one) days. 03/05/16   Jordan Boom, DO     No Known Allergies  ROS:  Out of a complete 14 system review of symptoms, the patient complains only of the following symptoms, and all other reviewed systems are negative.  Numbness, weakness  Blood pressure 140/89, pulse 89, height  (1.651 m), weight 215 lb (97.5 kg).  Physical Exam  General: The patient is alert and cooperative at the time of the examination.  Eyes: Pupils are equal, round, and reactive to light. Discs are flat bilaterally.  Neck: The neck is supple, no carotid bruits are  noted.  Respiratory: The respiratory examination is clear.  Cardiovascular: The cardiovascular examination reveals a regular rate and rhythm, no obvious murmurs or rubs are noted.  Neuromuscular: The patient lacks only about 10 of full lateral rotation of the cervical spine bilaterally.  Skin: Extremities are without significant edema.  Neurologic Exam  Mental status: The patient is alert and oriented x 3 at the time of the examination. The patient has apparent normal recent and remote memory, with an apparently normal attention span and concentration ability.  Cranial nerves: Facial symmetry is present. There is good sensation of the face to pinprick and soft touch bilaterally. The strength of the facial muscles and the muscles to head turning and shoulder shrug are normal bilaterally. Speech is well enunciated, no aphasia or dysarthria is noted. Extraocular movements are full. Visual fields are full. The tongue is midline, and the patient has symmetric elevation of the soft palate. No obvious hearing deficits are noted.  Motor: The motor testing reveals 5 over 5 strength of all 4 extremities, with exception that with the left arm there was 3/5 strength with extension of the thumb and fingers, the patient has radial deviation of the hand with extension, there is 4 minus/5 strength of the left triceps, trace weakness of the left biceps and deltoid muscle, some weakness with supination, normal pronation strength. Good symmetric motor tone is noted throughout.  Sensory: Sensory testing is intact to pinprick, soft touch, vibration sensation, and position sense on all 4 extremities. No evidence of extinction is noted.  Coordination: Cerebellar testing reveals good finger-nose-finger and heel-to-shin bilaterally.  Gait and station: Gait is normal. Tandem gait is normal. Romberg is negative. No drift is seen.  Reflexes: Deep tendon reflexes are symmetric bilaterally, but reflexes in the lower  extremities were relatively brisk as compared to those in the arms. Toes are downgoing bilaterally.   Assessment/Plan:  1. Left upper weakness  2. Prior cervical spine surgery  The patient has developed paresthesias down both arms, progressive weakness of the left arm that appears to mainly involve the radial nerve innervated muscles. The patient has some elevated reflexes in the lower extremities. The patient will be sent for MRI of the cervical spine to exclude spinal cord compression. If this study is unremarkable, EMG and nerve conduction study evaluation of the left arm will be done. The patient will be sent for occupational therapy to help improve function of the left hand. The patient will follow-up depending upon the results of the MRI study.  Marlan Palau MD 06/24/2016 8:34 AM  [facility] [address] [place], [place] [address]  Phone [phone] Fax [phone]

## 2016-06-24 NOTE — Telephone Encounter (Signed)
Called patient and scheduled apt.

## 2016-06-29 ENCOUNTER — Telehealth: Payer: Self-pay | Admitting: Neurology

## 2016-06-29 DIAGNOSIS — R29898 Other symptoms and signs involving the musculoskeletal system: Secondary | ICD-10-CM

## 2016-06-29 MED FILL — XIGDUO XR 10 MG-1,000 MG TA: 10-1000 | 30 days supply | Qty: 30 | Fill #4

## 2016-06-29 NOTE — Telephone Encounter (Signed)
Patient called in to cancel his MRI and would like to r/s this Wednesday, August 3rd if possible.  Please call.

## 2016-06-29 NOTE — Telephone Encounter (Signed)
Danielle / Andrey Campanile I think this call is for you. Thanks Annabelle Harman

## 2016-06-29 NOTE — Telephone Encounter (Signed)
Patient is calling back to r/s his MRI.

## 2016-06-30 NOTE — Telephone Encounter (Signed)
Patient called requesting to reschedule MRI, please call [phone].

## 2016-07-01 ENCOUNTER — Other Ambulatory Visit: Payer: 59

## 2016-07-01 ENCOUNTER — Ambulatory Visit (INDEPENDENT_AMBULATORY_CARE_PROVIDER_SITE_OTHER): Payer: Self-pay

## 2016-07-01 DIAGNOSIS — Z0289 Encounter for other administrative examinations: Secondary | ICD-10-CM

## 2016-07-01 DIAGNOSIS — R29898 Other symptoms and signs involving the musculoskeletal system: Secondary | ICD-10-CM

## 2016-07-01 NOTE — Telephone Encounter (Signed)
Spoke with patient and scheduled MRI.  

## 2016-07-03 NOTE — Telephone Encounter (Signed)
I spoke with patient this morning and gave him the phone number to [facility] scheduling. He wants to do it at the hospital. He does not need a new order, he just needs to call and schedule the apt. I also returned his wifes call and left a VM letting her know I had already spoken to the patient.

## 2016-07-03 NOTE — Telephone Encounter (Signed)
Spoke with patients wife, she stated that since the old order was attached to his previous apt that they would need a new order put in. I informed her that a new order had already been put in. She said she would call [facility] to schedule.

## 2016-07-03 NOTE — Telephone Encounter (Signed)
Wife [name] called to advise, husband was unable to do [facility] because patient was too big, would have to cross arms across his chest and they wouldn't be able to get a good image so MRI was cancelled, to be rescheduled at [facility]. Wife states [facility] needs our office to redo the order, shows that order has been completed, notes say MRI cancelled. Please put in new order for this MRI so that [facility] can schedule this MRI appointment for patient and call wife to advise [phone].

## 2016-07-06 ENCOUNTER — Other Ambulatory Visit: Payer: Self-pay

## 2016-07-06 DIAGNOSIS — R29898 Other symptoms and signs involving the musculoskeletal system: Secondary | ICD-10-CM

## 2016-07-06 MED FILL — TRADJENTA 5 MG TABLET: 5 | 30 days supply | Qty: 30 | Fill #2

## 2016-07-07 ENCOUNTER — Ambulatory Visit (HOSPITAL_COMMUNITY)
Admission: RE | Admit: 2016-07-07 | Discharge: 2016-07-07 | Disposition: A | Discharge status: Home / Self Care | Payer: 59 | Source: Ambulatory Visit | Attending: Neurology | Admitting: Neurology

## 2016-07-07 ENCOUNTER — Ambulatory Visit: Payer: Self-pay | Admitting: Pharmacist

## 2016-07-07 ENCOUNTER — Ambulatory Visit (INDEPENDENT_AMBULATORY_CARE_PROVIDER_SITE_OTHER): Payer: 59 | Admitting: Family Medicine

## 2016-07-07 ENCOUNTER — Telehealth: Payer: Self-pay | Admitting: Neurology

## 2016-07-07 VITALS — BP 142/83 | HR 92 | Wt 209.0 lb

## 2016-07-07 DIAGNOSIS — E291 Testicular hypofunction: Secondary | ICD-10-CM | POA: Diagnosis not present

## 2016-07-07 DIAGNOSIS — R29898 Other symptoms and signs involving the musculoskeletal system: Secondary | ICD-10-CM | POA: Insufficient documentation

## 2016-07-07 DIAGNOSIS — M50322 Other cervical disc degeneration at C5-C6 level: Secondary | ICD-10-CM | POA: Insufficient documentation

## 2016-07-07 DIAGNOSIS — R635 Abnormal weight gain: Secondary | ICD-10-CM

## 2016-07-07 DIAGNOSIS — M47892 Other spondylosis, cervical region: Secondary | ICD-10-CM | POA: Diagnosis not present

## 2016-07-07 DIAGNOSIS — G9589 Other specified diseases of spinal cord: Secondary | ICD-10-CM | POA: Insufficient documentation

## 2016-07-07 DIAGNOSIS — M4712 Other spondylosis with myelopathy, cervical region: Secondary | ICD-10-CM

## 2016-07-07 DIAGNOSIS — M50323 Other cervical disc degeneration at C6-C7 level: Secondary | ICD-10-CM | POA: Diagnosis not present

## 2016-07-07 DIAGNOSIS — M50321 Other cervical disc degeneration at C4-C5 level: Secondary | ICD-10-CM | POA: Diagnosis not present

## 2016-07-07 MED ORDER — PHENTERMINE HCL 37.5 MG PO TABS: 37.5000 mg | ORAL_TABLET | Freq: Every day | ORAL | 0 refills | Status: DC

## 2016-07-07 MED ORDER — TESTOSTERONE CYPIONATE 200 MG/ML IM SOLN
200.0000 mg | Freq: Once | INTRAMUSCULAR | Status: AC
  Administered 2016-07-07: 200 mg via INTRAMUSCULAR

## 2016-07-07 MED FILL — PHENTERMINE 37.5 MG TABLET: 37.5 | 30 days supply | Qty: 30 | Fill #0

## 2016-07-07 NOTE — Progress Notes (Signed)
   Subjective:    Patient ID: Jordan Taylor, male    DOB: 1966-01-02, 50 y.o.   MRN: [id number]  HPI  Jordan Taylor is here for a testosterone injection. Denies chest pain, shortness of breath, headaches or mood changes.   He is also here for blood pressure and weight check. Diet and exercise is going well. Denies trouble sleeping or palpitations.     Review of Systems     Objective:   Physical Exam        Assessment & Plan:  Hypogonadism - Patient tolerated injection well without complications. Patient advised to schedule next injection 14 days from today.   Abnormal weight gain - Patient has lost weight a refill of the phentermine will be sent to the pharmacy. Patient advised to follow up in 1 month. He is down 6 lbs.     Agree with above.   Nani Gasseratherine Metheney, MD

## 2016-07-07 NOTE — Telephone Encounter (Signed)
    MRI cervical 07/07/16:  IMPRESSION: 1. Cervical spondylosis and degenerative disc disease, causing prominent impingement at C3-4, C4-5, and C6-7; moderate to prominent impingement at C5-6 and C7-T1; and mild impingement at C2-3, as detailed above. 2. Myelomalacia at C4-5 and C5-6, with thinning of the cervical cord at the C5-6 level.

## 2016-07-07 NOTE — Telephone Encounter (Signed)
MRI the cervical cord shows multilevel spinal stenosis at the C3-4, 45, and 67 levels. The patient appears to have high signal in the cord at the C4-5 level, C5-6 level, and possibly to a lesser degree at the C3-4 and C6-7 levels. Thinning of the spinal cord is seen primarily at the C5-6 levels.   I discussed the report with the patient, I have recommended a neurosurgical evaluation. He is amenable to this, I will get the referral made.    MRI cervical 07/07/16:  IMPRESSION: 1. Cervical spondylosis and degenerative disc disease, causing prominent impingement at C3-4, C4-5, and C6-7; moderate to prominent impingement at C5-6 and C7-T1; and mild impingement at C2-3, as detailed above. 2. Myelomalacia at C4-5 and C5-6, with thinning of the cervical cord at the C5-6 level.

## 2016-07-08 ENCOUNTER — Other Ambulatory Visit: Payer: 59

## 2016-07-09 ENCOUNTER — Telehealth: Payer: Self-pay | Admitting: Neurology

## 2016-07-09 NOTE — Telephone Encounter (Signed)
Pt's wife would like a call back to discuss billing concerns with you. Please call [phone]

## 2016-07-13 ENCOUNTER — Ambulatory Visit: Payer: 59 | Admitting: Pharmacist

## 2016-07-28 ENCOUNTER — Other Ambulatory Visit: Payer: Self-pay | Admitting: *Deleted

## 2016-07-28 ENCOUNTER — Encounter: Payer: Self-pay | Admitting: *Deleted

## 2016-07-28 NOTE — Patient Outreach (Addendum)
[facility]) Care Management   07/28/2016  Jordan Taylor 1966-09-26 [id number]  Jordan Taylor is an 50 y.o. male who presents to the [facility] Care Management office for routine Link To Wellness follow up for self management assistance with Type II DM, HTN, hyperlipidemia and obesity.  Subjective: Jordan Taylor says he has been having difficulty with left arm and hand weakness and numbness. He saw a neurologist on 7/26 and was referred to a neurosurgeon for multilevel spinal stenosis. He says he will see the neurosurgeon, Dr. Dutch Quint,  tomorrow.  He reports he last saw his primary care MD on 8/8 and received a testosterone injection, which he gets every 21 days. He says his testosterone level was normal when it was checked on 7/12. He did ask for a referral to a urologist for erectile dysfunction.  In regards to his diabetes, he says the glipizide was stopped- he doesn't remember why-  but other medications are the same. He says he checks his blood sugar 2-3 times daily and has been swimming in their home pool all summer. 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 He reports his most recent Hgb A1C as 7.6%. He says Dr. Ivan Anchors will be leaving his practice on September 1st and moving out of state.   Objective:   Review of Systems  Constitutional: Negative.     Physical Exam  Constitutional: He is oriented to person, place, and time. He appears well-developed and well-nourished.  Respiratory: Effort normal.  Neurological: He is alert and oriented to person, place, and time.  Skin: Skin is warm and dry.  Psychiatric: He has a normal mood and affect. His behavior is normal. Judgment and thought content normal.   Filed Weights   07/28/16 1305  Weight: 208 lb (94.3 kg)   Encounter Medications:   Outpatient Encounter Prescriptions as of 07/28/2016  Medication  Sig Note  . amLODipine (NORVASC) 5 MG tablet Take 1 tablet (5 mg total) by mouth daily.   . benazepril (LOTENSIN) 20 MG tablet TAKE 1 TABLET (20 MG TOTAL) BY MOUTH DAILY.   . Dapagliflozin-Metformin HCl ER 08-999 MG TB24 Take 1 tablet by mouth daily.   Marland Kitchen linagliptin (TRADJENTA) 5 MG TABS tablet Take 1 tablet (5 mg total) by mouth daily.   Marland Kitchen loratadine-pseudoephedrine (CLARITIN-D 24 HOUR) 10-240 MG 24 hr tablet Take 1 tablet by mouth daily. 07/28/2016: Takes prn  . phentermine (ADIPEX-P) 37.5 MG tablet Take 1 tablet (37.5 mg total) by mouth daily before breakfast.   . pravastatin (PRAVACHOL) 20 MG tablet Take 1 tablet (20 mg total) by mouth every evening.   . sildenafil (REVATIO) 20 MG tablet 1-4 tabs by mouth daily only as needed for sex   . sildenafil (VIAGRA) 100 MG tablet Take 0.5-1 tablets (50-100 mg total) by mouth daily as needed for erectile dysfunction.   Marland Kitchen testosterone cypionate (DEPO-TESTOSTERONE) 200 MG/ML injection Inject 1 mL (200 mg total) into the muscle every 21 ( twenty-one) days.   Marland Kitchen glipiZIDE (GLUCOTROL XL) 5 MG 24 hr tablet Take 1 tablet (5 mg total) by mouth daily with breakfast. (Patient not taking: Reported on 07/28/2016)    No facility-administered encounter medications on file as of 07/28/2016.     Functional Status:   No flowsheet data found.  Fall/Depression Screening:    PHQ 2/9 Scores 07/15/2015 11/05/2014  PHQ - 2 Score 0 0    Assessment:  Spouse of [employer] employee with Type II DM, hyperlipidemia, HTN and obesity. Meeting treatment targets only for HTN, and some minimum weight loss  Plan:  [facility] Problem One   Flowsheet Row Most Recent Value  Care Plan Problem One Type II DM with worsening glycemic control as evidenced by Hgb A1C= 7.6% on 06/10/16 (previous POC Hgb A1C= 7.5% on 02/25/16).  Meeting  treatment targets for HTN and hyperlipidemia with elevated triglycerides of 212 on 01/18/15, the most recent check, obesity with current body mass  index= 34.7  Role Documenting the Problem One  Care Management Coordinator  Care Plan for Problem One  Active  THN Long Term Goal (31-90 days) Improved control of DM as evidenced by Hgb A1C <7.0) with 75% of home CBG readings meeting target, ongoing good control of HTN as evidenced by consistent BP readings of  <140/<90 and improved hyperlipidemia  as evidenced by normal values on next lipid profile, evidence of ongoing weight loss or no weight gain at next assessment   THN Long Term Goal Start Date  07/28/16  Interventions for Problem One Long Term Goal Updated health history to include work up for left arm and hand weakness and numbness, and urology consult for erectile dysfunction, reviewed basic pathophysiology of Type II DM, reviewed CBG readings, reviewed pre and post meal CBG targets, reviewed result of labs done 7/12 including Hgb A1C and its correlation to estimated average glucose and A1C target, discussed strategies to assist with improved glycemic control, reminded him to ask for lipid profile check as his last one was done 01/28/15, discussed changes to the Link To Wellness program for 2018, will contact him in Dec about follow up      RNCM to fax today's office visit note to Dr. Ivan AnchorsHommel. RNCM will meet quarterly and as needed with patient per Link To Wellness program guidelines to assist with Type II DM, HTN, hyperlipidemia and obesity self-management and assess patient's progress toward mutually set goals.  Bary RichardJanet S. Lemario Chaikin RN,CCM,CDE [facility] Care Management Coordinator Link To Wellness Office Phone [phone] Office Fax [phone]

## 2016-07-29 ENCOUNTER — Other Ambulatory Visit: Payer: Self-pay | Admitting: Neurosurgery

## 2016-07-29 DIAGNOSIS — M4802 Spinal stenosis, cervical region: Secondary | ICD-10-CM | POA: Diagnosis not present

## 2016-07-29 DIAGNOSIS — Z6834 Body mass index (BMI) 34.0-34.9, adult: Secondary | ICD-10-CM | POA: Diagnosis not present

## 2016-07-30 ENCOUNTER — Other Ambulatory Visit: Payer: Self-pay | Admitting: Family Medicine

## 2016-07-30 DIAGNOSIS — N529 Male erectile dysfunction, unspecified: Secondary | ICD-10-CM

## 2016-07-30 MED FILL — SILDENAFIL 20 MG TABLET: 20 | 13 days supply | Qty: 50 | Fill #0

## 2016-08-05 ENCOUNTER — Ambulatory Visit (INDEPENDENT_AMBULATORY_CARE_PROVIDER_SITE_OTHER): Payer: 59 | Admitting: Family Medicine

## 2016-08-05 VITALS — BP 139/78 | HR 111 | Wt 204.0 lb

## 2016-08-05 DIAGNOSIS — R635 Abnormal weight gain: Secondary | ICD-10-CM | POA: Diagnosis not present

## 2016-08-05 DIAGNOSIS — E291 Testicular hypofunction: Secondary | ICD-10-CM

## 2016-08-05 MED ORDER — PHENTERMINE HCL 37.5 MG PO TABS: 37.5000 mg | ORAL_TABLET | Freq: Every day | ORAL | 0 refills | Status: DC

## 2016-08-05 MED ORDER — TESTOSTERONE CYPIONATE 200 MG/ML IM SOLN
200.0000 mg | Freq: Once | INTRAMUSCULAR | Status: AC
  Administered 2016-08-05: 200 mg via INTRAMUSCULAR

## 2016-08-05 MED FILL — PHENTERMINE 37.5 MG TABLET: 37.5 | 30 days supply | Qty: 30 | Fill #0

## 2016-08-05 MED FILL — XIGDUO XR 10 MG-1,000 MG TA: 10-1000 | 30 days supply | Qty: 30 | Fill #5

## 2016-08-05 NOTE — Progress Notes (Signed)
Patient is here for blood pressure, weight check, and testosterone injection. Denies any trouble sleeping, palpitations, or any other medication problems.  Denies chest pain, shortness of breath, headaches and problems associated with taking this medication. Patient states he has had no abnornal mood swings. Patient has lost weight. A refill for Phentermine will be sent to patient preferred pharmacy. Patient advised to schedule a four week nurse visit and keep her upcoming appointment with her PCP. Patient tolerated injection in ROUQ well without complications. Patient advised to schedule his next injection for 2 weeks from today. Verbalized understanding, no further questions.

## 2016-08-19 ENCOUNTER — Ambulatory Visit: Payer: 59

## 2016-08-26 ENCOUNTER — Ambulatory Visit (INDEPENDENT_AMBULATORY_CARE_PROVIDER_SITE_OTHER): Payer: 59 | Admitting: Osteopathic Medicine

## 2016-08-26 VITALS — BP 137/74 | HR 102 | Wt 210.0 lb

## 2016-08-26 DIAGNOSIS — E291 Testicular hypofunction: Secondary | ICD-10-CM | POA: Diagnosis not present

## 2016-08-26 MED ORDER — TESTOSTERONE CYPIONATE 200 MG/ML IM SOLN
200.0000 mg | Freq: Once | INTRAMUSCULAR | Status: AC
  Administered 2016-08-26: 200 mg via INTRAMUSCULAR

## 2016-08-26 NOTE — Progress Notes (Signed)
Patient came into office today for testosterone injection. Denies chest pain, shortness of breath, headaches and problems associated with taking this medication. Patient states he has had no abnornal mood swings. Patient tolerated injection in LUOQ well without complications. Patient advised to schedule his next injection for 2 weeks from today. 

## 2016-08-31 ENCOUNTER — Other Ambulatory Visit: Payer: Self-pay | Admitting: Family Medicine

## 2016-08-31 MED FILL — TRADJENTA 5 MG TABLET: 5 | 30 days supply | Qty: 30 | Fill #3

## 2016-08-31 MED FILL — AMLODIPINE BESYLATE 10 MG T: 10 | 30 days supply | Qty: 30 | Fill #0

## 2016-09-02 ENCOUNTER — Encounter (HOSPITAL_COMMUNITY): Payer: Self-pay

## 2016-09-02 ENCOUNTER — Encounter (HOSPITAL_COMMUNITY)
Admission: RE | Admit: 2016-09-02 | Discharge: 2016-09-02 | Disposition: A | Discharge status: Home / Self Care | Payer: 59 | Source: Ambulatory Visit | Attending: Neurosurgery | Admitting: Neurosurgery

## 2016-09-02 DIAGNOSIS — R000 Tachycardia, unspecified: Secondary | ICD-10-CM | POA: Diagnosis not present

## 2016-09-02 DIAGNOSIS — I444 Left anterior fascicular block: Secondary | ICD-10-CM | POA: Insufficient documentation

## 2016-09-02 DIAGNOSIS — M4802 Spinal stenosis, cervical region: Secondary | ICD-10-CM | POA: Diagnosis not present

## 2016-09-02 DIAGNOSIS — I10 Essential (primary) hypertension: Secondary | ICD-10-CM | POA: Insufficient documentation

## 2016-09-02 DIAGNOSIS — Z01810 Encounter for preprocedural cardiovascular examination: Secondary | ICD-10-CM | POA: Diagnosis not present

## 2016-09-02 DIAGNOSIS — Z01812 Encounter for preprocedural laboratory examination: Secondary | ICD-10-CM | POA: Insufficient documentation

## 2016-09-02 DIAGNOSIS — Z01818 Encounter for other preprocedural examination: Secondary | ICD-10-CM | POA: Insufficient documentation

## 2016-09-02 DIAGNOSIS — R9431 Abnormal electrocardiogram [ECG] [EKG]: Secondary | ICD-10-CM | POA: Insufficient documentation

## 2016-09-02 LAB — CBC WITH DIFFERENTIAL/PLATELET
Basophils Absolute: 0 10*3/uL (ref 0.0–0.1)
Basophils Relative: 0 %
Eosinophils Absolute: 0.2 10*3/uL (ref 0.0–0.7)
Eosinophils Relative: 1 %
HCT: 47.7 % (ref 39.0–52.0)
Hemoglobin: 16.2 g/dL (ref 13.0–17.0)
Lymphocytes Relative: 13 %
Lymphs Abs: 2 10*3/uL (ref 0.7–4.0)
MCH: 29.8 pg (ref 26.0–34.0)
MCHC: 34 g/dL (ref 30.0–36.0)
MCV: 87.8 fL (ref 78.0–100.0)
Monocytes Absolute: 0.9 10*3/uL (ref 0.1–1.0)
Monocytes Relative: 6 %
Neutro Abs: 11.9 10*3/uL — ABNORMAL HIGH (ref 1.7–7.7)
Neutrophils Relative %: 80 %
Platelets: 228 10*3/uL (ref 150–400)
RBC: 5.43 MIL/uL (ref 4.22–5.81)
RDW: 15 % (ref 11.5–15.5)
WBC: 15 10*3/uL — ABNORMAL HIGH (ref 4.0–10.5)

## 2016-09-02 LAB — BASIC METABOLIC PANEL
Anion gap: 9 (ref 5–15)
BUN: 11 mg/dL (ref 6–20)
CO2: 21 mmol/L — ABNORMAL LOW (ref 22–32)
Calcium: 9 mg/dL (ref 8.9–10.3)
Chloride: 107 mmol/L (ref 101–111)
Creatinine, Ser: 0.91 mg/dL (ref 0.61–1.24)
GFR calc Af Amer: >60 mL/min (ref >60)
GFR calc non Af Amer: >60 mL/min (ref >60)
Glucose, Bld: 264 mg/dL — ABNORMAL HIGH (ref 65–99)
Potassium: 4 mmol/L (ref 3.5–5.1)
Sodium: 137 mmol/L (ref 135–145)

## 2016-09-02 LAB — GLUCOSE, CAPILLARY: Glucose-Capillary: 265 mg/dL — ABNORMAL HIGH (ref 65–99)

## 2016-09-02 LAB — SURGICAL PCR SCREEN
MRSA, PCR: NEGATIVE
Staphylococcus aureus: POSITIVE — AB

## 2016-09-02 MED FILL — MUPIROCIN 2% OINTMENT: 2 | 5 days supply | Qty: 22 | Fill #0

## 2016-09-02 NOTE — Pre-Procedure Instructions (Signed)
Jordan Taylor            09/01/2016                          [facility] - [place], [place] - [address] [place] [address] Phone: [phone] Fax: [phone]              Your procedure is scheduled on Monday, October 9.            Report to [facility] Admitting at 07:30 A.M.            Call this number if you have problems the morning of surgery:            [phone]             Remember:            Do not eat food or drink liquids after midnight.            Take these medicines the morning of surgery with A SIP OF WATER: amlodipine (norvasc)  STOP TAKING today: Claritin-D, Phentermine (Apidex)   7 days prior to surgery STOP taking any Aspirin, Aleve, Naproxen, Ibuprofen, Motrin, Advil, Goody's, BC's, all herbal medications, fish oil, and all vitamins  WHAT DO I DO ABOUT MY DIABETES MEDICATION?    Do not take oral diabetes medicines (pills) the morning of surgery. DO NOT TAKE dapagliflozin-metformin, Glipizide (glucotrol), or linagliptin (Tradjenta) the day of surgery  .   HOW TO MANAGE YOUR DIABETES BEFORE AND AFTER SURGERY  Why is it important to control my blood sugar before and after surgery?  Improving blood sugar levels before and after surgery helps healing and can limit problems.  A way of improving blood sugar control is eating a healthy diet by: ?  Eating less sugar and carbohydrates ?  Increasing activity/exercise ?  Talking with your doctor about reaching your blood sugar goals  High blood sugars (greater than 180 mg/dL) can raise your risk of infections and slow your recovery, so you will need to focus on controlling your diabetes during the weeks before surgery.  Make sure that the doctor who takes care of your diabetes knows about your planned surgery including the date and location.  How do I manage my blood sugar before  surgery?  Check your blood sugar at least 4 times a day, starting 2 days before surgery, to make sure that the level is not too high or low. ? Check your blood sugar the morning of your surgery when you wake up and every 2 hours until you get to the Short Stay unit.  If your blood sugar is less than 70 mg/dL, you will need to treat for low blood sugar: ? Do not take insulin. ? Treat a low blood sugar (less than 70 mg/dL) with  cup of clear juice (cranberry or apple), 4glucose tablets, OR glucose gel. ? Recheck blood sugar in 15 minutes after treatment (to make sure it is greater than 70 mg/dL). If your blood sugar is not greater than 70 mg/dL on recheck, call [phone] for further instructions.  Report your blood sugar to the short stay nurse when you get to Short Stay.   If you are admitted to the hospital after surgery: ? Your blood sugar will be checked by the  staff and you will probably be given insulin after surgery (instead of oral diabetes medicines) to make sure you have good blood sugar levels. ? The goal for blood sugar control after surgery is 80-180 mg/dL.                         Do not wear jewelry, make-up or nail polish.            Do not wear lotions, powders, or perfumes, or deoderant.            Do not shave 48 hours prior to surgery.  Men may shave face and neck.            Do not bring valuables to the hospital.            [facility] is not responsible for any belongings or valuables.  Contacts, dentures or bridgework may not be worn into surgery.  Leave your suitcase in the car.  After surgery it may be brought to your room.  For patients admitted to the hospital, discharge time will be determined by your treatment team.  Patients discharged the day of surgery will not be allowed to drive home.   Special instructions:     [place]- Preparing For Surgery  Before surgery, you can play an important role. Because skin is not  sterile, your skin needs to be as free of germs as possible. You can reduce the number of germs on your skin by washing with CHG (chlorahexidine gluconate) Soap before surgery.  CHG is an antiseptic cleaner which kills germs and bonds with the skin to continue killing germs even after washing.  Please do not use if you have an allergy to CHG or antibacterial soaps. If your skin becomes reddened/irritated stop using the CHG.  Do not shave (including legs and underarms) for at least 48 hours prior to first CHG shower. It is OK to shave your face.  Please follow these instructions carefully.                                                                                                                     1. Shower the NIGHT BEFORE SURGERY and the MORNING OF SURGERY with CHG.   2. If you chose to wash your hair, wash your hair first as usual with your normal shampoo.  3. After you shampoo, rinse your hair and body thoroughly to remove the shampoo.  4. Use CHG as you would any other liquid soap. You can apply CHG directly to the skin and wash gently with a scrungie or a clean washcloth.   5. Apply the CHG Soap to your body ONLY FROM THE NECK DOWN.  Do not use on open wounds or open sores. Avoid contact with your eyes, ears, mouth and genitals (private parts). Wash genitals (private parts) with your normal soap.  6. Wash thoroughly, paying special attention to the area where your surgery will be performed.  7.  Thoroughly rinse your body with warm water from the neck down.  8. DO NOT shower/wash with your normal soap after using and rinsing off the CHG Soap.  9. Pat yourself dry with a CLEAN TOWEL.   10. Wear CLEAN PAJAMAS   11. Place CLEAN SHEETS on your bed the night of your first shower and DO NOT SLEEP WITH PETS.    Day of Surgery: Do not apply any deodorants/lotions. Please wear clean clothes to the hospital/surgery center.      Please read over the following fact  sheets that you were given. MRSA Information

## 2016-09-02 NOTE — Progress Notes (Signed)
PCR MSSA positive. Prescription called to pharmacy. Attempted to call pt regarding positive result. Unable to reach patient, no voicemail available.

## 2016-09-02 NOTE — Progress Notes (Signed)
PCP: Sunnie NielsenNatalie Alexander Pt denies cardiac hx or cardiac workup EKG: 09/02/16  Pt CBG 265 this AM. Pt states his CBG was 180 at home and he ate a biscuit and cup of juice prior to coming to appointment. Last A1c 7.6 in July. For redraw today.   Pt takes Apidex, states last dose was 2 weeks ago. Pt last dose of Claritin-D yesterday.   Pt with no complaints of chest pain, SOB

## 2016-09-03 LAB — HEMOGLOBIN A1C
Hgb A1c MFr Bld: 9.8 % — ABNORMAL HIGH (ref 4.8–5.6)
Mean Plasma Glucose: 235 mg/dL

## 2016-09-03 NOTE — Progress Notes (Signed)
Anesthesia Chart Review:  Pt is a 50 year old male scheduled for C3-4, C4-5, C5-6, C6-7 on ACDF on 09/07/2016 with Jordan SicksHenry Pool, MD.   PMH includes:  HTN, DM. Former smoker. BMI 34.5  Medications include: amlodipine, benazepril, dapaglifozin-metformin, linagliptin, phentermine, pravastatin, sildenafil.  Pt has not taken phentermine for 2 weeks.   Preoperative labs reviewed.   - HgbA1c 9.8, glucose 264.   - WBC 15.0 - I notified Jordan Taylor in Dr. Lindalou Taylor's office of abnormal labs.   EKG 09/02/16: Sinus tachycardia (103 bpm). Left anterior fascicular block  If glucose acceptable DOS, I anticipate pt can proceed as scheduled.   Jordan Mastngela Kabbe, FNP-BC [facility]/Anesthesiology Phone: [phone] 09/03/2016 4:12 PM

## 2016-09-04 ENCOUNTER — Other Ambulatory Visit: Payer: Self-pay | Admitting: *Deleted

## 2016-09-04 NOTE — Progress Notes (Signed)
Many unsuccessful attempts have been made to contact pt to make aware that PCR swab was + for Staph. Left voice message with Vanessa's, CMA,  requesting an alternate contact number; awaiting a return call.

## 2016-09-04 NOTE — Patient Outreach (Signed)
Phone call to Jordan Taylor regarding his upcoming surgery on Monday 09/07/16  for C3-4, C4-5, C5-6, C6-7 on ACDF with Jordan SicksHenry Pool, MD.  HE says he feels prepared for surgery and his wife Jordan Taylor is taking 2 weeks FMLA to assist with his post-op recovery. Advised Jordan Taylor this RNCM will be calling him post-op to assess his recovery once he is home. Jordan Taylor stated he appreciated the call. Jordan RichardJanet S. Kieran Nachtigal RN,CCM,CDE [facility] Care Management Coordinator Link To Wellness Office Phone [phone] Office Fax [phone]

## 2016-09-07 ENCOUNTER — Inpatient Hospital Stay (HOSPITAL_COMMUNITY): Payer: 59 | Admitting: Certified Registered"

## 2016-09-07 ENCOUNTER — Inpatient Hospital Stay (HOSPITAL_COMMUNITY)
Admission: RE | Admit: 2016-09-07 | Discharge: 2016-09-08 | DRG: 473 | Disposition: A | Discharge status: Home / Self Care | Payer: 59 | Source: Ambulatory Visit | Attending: Neurosurgery | Admitting: Neurosurgery

## 2016-09-07 ENCOUNTER — Encounter (HOSPITAL_COMMUNITY): Payer: Self-pay | Admitting: *Deleted

## 2016-09-07 ENCOUNTER — Inpatient Hospital Stay (HOSPITAL_COMMUNITY): Payer: 59

## 2016-09-07 ENCOUNTER — Inpatient Hospital Stay (HOSPITAL_COMMUNITY): Payer: 59 | Admitting: Emergency Medicine

## 2016-09-07 ENCOUNTER — Telehealth: Payer: Self-pay | Admitting: Osteopathic Medicine

## 2016-09-07 ENCOUNTER — Encounter (HOSPITAL_COMMUNITY)
Admission: RE | Disposition: A | Discharge status: Home / Self Care | Payer: Self-pay | Source: Ambulatory Visit | Attending: Neurosurgery

## 2016-09-07 DIAGNOSIS — E119 Type 2 diabetes mellitus without complications: Secondary | ICD-10-CM | POA: Diagnosis not present

## 2016-09-07 DIAGNOSIS — M4712 Other spondylosis with myelopathy, cervical region: Secondary | ICD-10-CM | POA: Diagnosis present

## 2016-09-07 DIAGNOSIS — M4322 Fusion of spine, cervical region: Secondary | ICD-10-CM | POA: Diagnosis not present

## 2016-09-07 DIAGNOSIS — I10 Essential (primary) hypertension: Secondary | ICD-10-CM | POA: Diagnosis present

## 2016-09-07 DIAGNOSIS — Z419 Encounter for procedure for purposes other than remedying health state, unspecified: Secondary | ICD-10-CM

## 2016-09-07 DIAGNOSIS — M4302 Spondylolysis, cervical region: Secondary | ICD-10-CM | POA: Diagnosis not present

## 2016-09-07 DIAGNOSIS — M4802 Spinal stenosis, cervical region: Secondary | ICD-10-CM | POA: Diagnosis not present

## 2016-09-07 DIAGNOSIS — Z23 Encounter for immunization: Secondary | ICD-10-CM | POA: Diagnosis not present

## 2016-09-07 DIAGNOSIS — Z87891 Personal history of nicotine dependence: Secondary | ICD-10-CM

## 2016-09-07 DIAGNOSIS — Z7984 Long term (current) use of oral hypoglycemic drugs: Secondary | ICD-10-CM

## 2016-09-07 DIAGNOSIS — M5001 Cervical disc disorder with myelopathy,  high cervical region: Secondary | ICD-10-CM | POA: Diagnosis not present

## 2016-09-07 DIAGNOSIS — R531 Weakness: Secondary | ICD-10-CM | POA: Diagnosis present

## 2016-09-07 HISTORY — PX: ANTERIOR CERVICAL DECOMPRESSION/DISCECTOMY FUSION 4 LEVELS: SHX5556

## 2016-09-07 LAB — GLUCOSE, CAPILLARY
Glucose-Capillary: 168 mg/dL — ABNORMAL HIGH (ref 65–99)
Glucose-Capillary: 265 mg/dL — ABNORMAL HIGH (ref 65–99)
Glucose-Capillary: 262 mg/dL — ABNORMAL HIGH (ref 65–99)
Glucose-Capillary: 309 mg/dL — ABNORMAL HIGH (ref 65–99)

## 2016-09-07 SURGERY — ANTERIOR CERVICAL DECOMPRESSION/DISCECTOMY FUSION 4 LEVELS
Anesthesia: General

## 2016-09-07 MED ORDER — ROCURONIUM BROMIDE 10 MG/ML (PF) SYRINGE
PREFILLED_SYRINGE | INTRAVENOUS | Status: DC | PRN
  Administered 2016-09-07: 20 mg via INTRAVENOUS
  Administered 2016-09-07: 30 mg via INTRAVENOUS

## 2016-09-07 MED ORDER — ONDANSETRON HCL 4 MG/2ML IJ SOLN
INTRAMUSCULAR | Status: DC | PRN
  Administered 2016-09-07 (×2): 4 mg via INTRAVENOUS

## 2016-09-07 MED ORDER — THROMBIN 20000 UNITS EX SOLR
CUTANEOUS | Status: AC
  Filled 2016-09-07: qty 20000

## 2016-09-07 MED ORDER — CEFAZOLIN IN D5W 1 GM/50ML IV SOLN
1.0000 g | Freq: Three times a day (TID) | INTRAVENOUS | Status: AC
  Administered 2016-09-07 – 2016-09-08 (×2): 1 g via INTRAVENOUS
  Filled 2016-09-07 (×2): qty 50

## 2016-09-07 MED ORDER — HYDROMORPHONE HCL 1 MG/ML IJ SOLN
INTRAMUSCULAR | Status: AC
  Filled 2016-09-07: qty 1

## 2016-09-07 MED ORDER — LINAGLIPTIN 5 MG PO TABS
5.0000 mg | ORAL_TABLET | Freq: Every day | ORAL | Status: DC
  Administered 2016-09-08: 5 mg via ORAL
  Filled 2016-09-07: qty 1

## 2016-09-07 MED ORDER — PRAVASTATIN SODIUM 40 MG PO TABS
20.0000 mg | ORAL_TABLET | Freq: Every evening | ORAL | Status: DC
  Administered 2016-09-07: 20 mg via ORAL
  Filled 2016-09-07: qty 1

## 2016-09-07 MED ORDER — LIDOCAINE 2% (20 MG/ML) 5 ML SYRINGE
INTRAMUSCULAR | Status: DC | PRN
  Administered 2016-09-07: 70 mg via INTRAVENOUS

## 2016-09-07 MED ORDER — MUPIROCIN 2 % EX OINT
1.0000 "application " | TOPICAL_OINTMENT | Freq: Two times a day (BID) | CUTANEOUS | Status: DC
  Administered 2016-09-07: 1 via TOPICAL

## 2016-09-07 MED ORDER — MUPIROCIN 2 % EX OINT
1.0000 "application " | TOPICAL_OINTMENT | Freq: Two times a day (BID) | CUTANEOUS | Status: DC
  Administered 2016-09-07 – 2016-09-08 (×2): 1 via NASAL

## 2016-09-07 MED ORDER — MUPIROCIN 2 % EX OINT
TOPICAL_OINTMENT | CUTANEOUS | Status: AC
  Administered 2016-09-07: 1 via TOPICAL
  Filled 2016-09-07: qty 22

## 2016-09-07 MED ORDER — ROCURONIUM BROMIDE 10 MG/ML (PF) SYRINGE
PREFILLED_SYRINGE | INTRAVENOUS | Status: AC
  Filled 2016-09-07: qty 10

## 2016-09-07 MED ORDER — ONDANSETRON HCL 4 MG/2ML IJ SOLN
INTRAMUSCULAR | Status: AC
  Filled 2016-09-07: qty 2

## 2016-09-07 MED ORDER — SODIUM CHLORIDE 0.9% FLUSH: 3.0000 mL | INTRAVENOUS | Status: DC | PRN

## 2016-09-07 MED ORDER — BENAZEPRIL HCL 20 MG PO TABS
20.0000 mg | ORAL_TABLET | Freq: Every day | ORAL | Status: DC
  Administered 2016-09-07 – 2016-09-08 (×2): 20 mg via ORAL
  Filled 2016-09-07 (×2): qty 1

## 2016-09-07 MED ORDER — INFLUENZA VAC SPLIT QUAD 0.5 ML IM SUSY
0.5000 mL | PREFILLED_SYRINGE | INTRAMUSCULAR | Status: AC
  Administered 2016-09-08: 0.5 mL via INTRAMUSCULAR
  Filled 2016-09-07: qty 0.5

## 2016-09-07 MED ORDER — SODIUM CHLORIDE 0.9 % IR SOLN
Status: DC | PRN
  Administered 2016-09-07: 10:00:00

## 2016-09-07 MED ORDER — SODIUM CHLORIDE 0.9 % IV SOLN: 250.0000 mL | INTRAVENOUS | Status: DC

## 2016-09-07 MED ORDER — CHLORHEXIDINE GLUCONATE CLOTH 2 % EX PADS: 6.0000 | MEDICATED_PAD | Freq: Once | CUTANEOUS | Status: DC

## 2016-09-07 MED ORDER — SODIUM CHLORIDE 0.9 % IJ SOLN
INTRAMUSCULAR | Status: AC
  Filled 2016-09-07: qty 10

## 2016-09-07 MED ORDER — MIDAZOLAM HCL 2 MG/2ML IJ SOLN
INTRAMUSCULAR | Status: AC
  Filled 2016-09-07: qty 2

## 2016-09-07 MED ORDER — ACETAMINOPHEN 325 MG PO TABS: 650.0000 mg | ORAL_TABLET | ORAL | Status: DC | PRN

## 2016-09-07 MED ORDER — PROPOFOL 10 MG/ML IV BOLUS
INTRAVENOUS | Status: AC
  Filled 2016-09-07: qty 20

## 2016-09-07 MED ORDER — SUFENTANIL CITRATE 50 MCG/ML IV SOLN
INTRAVENOUS | Status: DC | PRN
  Administered 2016-09-07 (×2): 10 ug via INTRAVENOUS
  Administered 2016-09-07: 20 ug via INTRAVENOUS
  Administered 2016-09-07 (×3): 10 ug via INTRAVENOUS

## 2016-09-07 MED ORDER — SUGAMMADEX SODIUM 200 MG/2ML IV SOLN
INTRAVENOUS | Status: AC
  Filled 2016-09-07: qty 2

## 2016-09-07 MED ORDER — ACETAMINOPHEN 650 MG RE SUPP: 650.0000 mg | RECTAL | Status: DC | PRN

## 2016-09-07 MED ORDER — HYDROMORPHONE HCL 1 MG/ML IJ SOLN
0.2500 mg | INTRAMUSCULAR | Status: DC | PRN
  Administered 2016-09-07 (×3): 0.5 mg via INTRAVENOUS

## 2016-09-07 MED ORDER — GLIPIZIDE ER 5 MG PO TB24
5.0000 mg | ORAL_TABLET | Freq: Every day | ORAL | Status: DC
  Administered 2016-09-07: 5 mg via ORAL
  Filled 2016-09-07: qty 1

## 2016-09-07 MED ORDER — OXYCODONE HCL 5 MG PO TABS
5.0000 mg | ORAL_TABLET | Freq: Once | ORAL | Status: DC | PRN
Start: 2016-09-07 — End: 2016-09-07

## 2016-09-07 MED ORDER — SUCCINYLCHOLINE CHLORIDE 20 MG/ML IJ SOLN
INTRAMUSCULAR | Status: DC | PRN
  Administered 2016-09-07: 120 mg via INTRAVENOUS

## 2016-09-07 MED ORDER — ONDANSETRON HCL 4 MG/2ML IJ SOLN: 4.0000 mg | INTRAMUSCULAR | Status: DC | PRN

## 2016-09-07 MED ORDER — SODIUM CHLORIDE 0.9% FLUSH
3.0000 mL | Freq: Two times a day (BID) | INTRAVENOUS | Status: DC
  Administered 2016-09-07: 3 mL via INTRAVENOUS

## 2016-09-07 MED ORDER — OXYCODONE HCL 5 MG/5ML PO SOLN: 5.0000 mg | Freq: Once | ORAL | Status: DC | PRN

## 2016-09-07 MED ORDER — LACTATED RINGERS IV SOLN
INTRAVENOUS | Status: DC
  Administered 2016-09-07: 08:00:00 via INTRAVENOUS

## 2016-09-07 MED ORDER — DEXAMETHASONE SODIUM PHOSPHATE 10 MG/ML IJ SOLN
10.0000 mg | INTRAMUSCULAR | Status: AC
  Administered 2016-09-07: 10 mg via INTRAVENOUS
  Filled 2016-09-07: qty 1

## 2016-09-07 MED ORDER — CANAGLIFLOZIN 100 MG PO TABS
100.0000 mg | ORAL_TABLET | Freq: Every day | ORAL | Status: DC
  Administered 2016-09-08: 100 mg via ORAL
  Filled 2016-09-07: qty 1

## 2016-09-07 MED ORDER — PHENTERMINE HCL 37.5 MG PO TABS: 37.5000 mg | ORAL_TABLET | Freq: Every day | ORAL | Status: DC

## 2016-09-07 MED ORDER — THROMBIN 5000 UNITS EX SOLR
OROMUCOSAL | Status: DC | PRN
  Administered 2016-09-07: 11:00:00 via TOPICAL

## 2016-09-07 MED ORDER — SUFENTANIL CITRATE 50 MCG/ML IV SOLN
INTRAVENOUS | Status: AC
  Filled 2016-09-07: qty 1

## 2016-09-07 MED ORDER — INSULIN ASPART 100 UNIT/ML ~~LOC~~ SOLN
0.0000 [IU] | Freq: Three times a day (TID) | SUBCUTANEOUS | Status: DC
  Administered 2016-09-07: 5 [IU] via SUBCUTANEOUS
  Administered 2016-09-08: 3 [IU] via SUBCUTANEOUS

## 2016-09-07 MED ORDER — ESMOLOL HCL 100 MG/10ML IV SOLN
INTRAVENOUS | Status: DC | PRN
  Administered 2016-09-07: 20 mg via INTRAVENOUS

## 2016-09-07 MED ORDER — MENTHOL 3 MG MT LOZG: 1.0000 | LOZENGE | OROMUCOSAL | Status: DC | PRN

## 2016-09-07 MED ORDER — AMLODIPINE BESYLATE 5 MG PO TABS
5.0000 mg | ORAL_TABLET | Freq: Every day | ORAL | Status: DC
  Administered 2016-09-07 – 2016-09-08 (×2): 5 mg via ORAL
  Filled 2016-09-07 (×2): qty 1

## 2016-09-07 MED ORDER — THROMBIN 5000 UNITS EX SOLR
CUTANEOUS | Status: AC
  Filled 2016-09-07: qty 5000

## 2016-09-07 MED ORDER — PHENOL 1.4 % MT LIQD: 1.0000 | OROMUCOSAL | Status: DC | PRN

## 2016-09-07 MED ORDER — ONDANSETRON HCL 4 MG/2ML IJ SOLN: 4.0000 mg | Freq: Four times a day (QID) | INTRAMUSCULAR | Status: DC | PRN

## 2016-09-07 MED ORDER — OXYCODONE-ACETAMINOPHEN 5-325 MG PO TABS
1.0000 | ORAL_TABLET | ORAL | Status: DC | PRN
  Administered 2016-09-07 – 2016-09-08 (×5): 2 via ORAL
  Filled 2016-09-07 (×5): qty 2

## 2016-09-07 MED ORDER — HYDROMORPHONE HCL 1 MG/ML IJ SOLN
0.5000 mg | INTRAMUSCULAR | Status: DC | PRN
  Administered 2016-09-07 – 2016-09-08 (×4): 1 mg via INTRAVENOUS
  Filled 2016-09-07 (×4): qty 1

## 2016-09-07 MED ORDER — MIDAZOLAM HCL 5 MG/5ML IJ SOLN
INTRAMUSCULAR | Status: DC | PRN
  Administered 2016-09-07: 2 mg via INTRAVENOUS

## 2016-09-07 MED ORDER — METFORMIN HCL ER 500 MG PO TB24
1000.0000 mg | ORAL_TABLET | Freq: Every day | ORAL | Status: DC
  Administered 2016-09-08: 1000 mg via ORAL
  Filled 2016-09-07: qty 2

## 2016-09-07 MED ORDER — THROMBIN 20000 UNITS EX KIT
PACK | CUTANEOUS | Status: DC | PRN
  Administered 2016-09-07: 10:00:00 via TOPICAL

## 2016-09-07 MED ORDER — CEFAZOLIN SODIUM-DEXTROSE 2-4 GM/100ML-% IV SOLN
2.0000 g | INTRAVENOUS | Status: AC
  Administered 2016-09-07: 2 g via INTRAVENOUS
  Filled 2016-09-07: qty 100

## 2016-09-07 MED ORDER — DAPAGLIFLOZIN PRO-METFORMIN ER 10-1000 MG PO TB24: 1.0000 | ORAL_TABLET | Freq: Every day | ORAL | Status: DC

## 2016-09-07 MED ORDER — CYCLOBENZAPRINE HCL 10 MG PO TABS
10.0000 mg | ORAL_TABLET | Freq: Three times a day (TID) | ORAL | Status: DC | PRN
  Administered 2016-09-07 – 2016-09-08 (×3): 10 mg via ORAL
  Filled 2016-09-07 (×3): qty 1

## 2016-09-07 MED ORDER — DIPHENHYDRAMINE HCL 50 MG/ML IJ SOLN
INTRAMUSCULAR | Status: DC | PRN
  Administered 2016-09-07: 10 mg via INTRAVENOUS

## 2016-09-07 MED ORDER — PROPOFOL 10 MG/ML IV BOLUS
INTRAVENOUS | Status: DC | PRN
Start: 2016-09-07 — End: 2016-09-07
  Administered 2016-09-07: 200 mg via INTRAVENOUS

## 2016-09-07 MED ORDER — 0.9 % SODIUM CHLORIDE (POUR BTL) OPTIME
TOPICAL | Status: DC | PRN
  Administered 2016-09-07: 1000 mL

## 2016-09-07 MED ORDER — INSULIN ASPART 100 UNIT/ML ~~LOC~~ SOLN
0.0000 [IU] | Freq: Three times a day (TID) | SUBCUTANEOUS | Status: DC
  Administered 2016-09-07: 8 [IU] via SUBCUTANEOUS
  Filled 2016-09-07 (×25): qty 0.15

## 2016-09-07 MED ORDER — HYDROCODONE-ACETAMINOPHEN 5-325 MG PO TABS: 1.0000 | ORAL_TABLET | ORAL | Status: DC | PRN

## 2016-09-07 MED ORDER — LIDOCAINE 2% (20 MG/ML) 5 ML SYRINGE
INTRAMUSCULAR | Status: AC
  Filled 2016-09-07: qty 5

## 2016-09-07 SURGICAL SUPPLY — 58 items
BAG DECANTER FOR FLEXI CONT (MISCELLANEOUS) ×2 IMPLANT
BENZOIN TINCTURE PRP APPL 2/3 (GAUZE/BANDAGES/DRESSINGS) ×2 IMPLANT
BIT DRILL 13 (BIT) ×2 IMPLANT
BUR MATCHSTICK NEURO 3.0 LAGG (BURR) ×2 IMPLANT
CAGE PEEK 6X14X11 (Cage) ×2 IMPLANT
CAGE PEEK 7X14X11 (Cage) ×2 IMPLANT
CAGE SPNL 11X14X6XRADOPQ (Cage) ×1 IMPLANT
CANISTER SUCT 3000ML PPV (MISCELLANEOUS) ×2 IMPLANT
DRAPE C-ARM 42X72 X-RAY (DRAPES) ×4 IMPLANT
DRAPE LAPAROTOMY 100X72 PEDS (DRAPES) ×2 IMPLANT
DRAPE MICROSCOPE LEICA (MISCELLANEOUS) ×2 IMPLANT
DRAPE POUCH INSTRU U-SHP 10X18 (DRAPES) ×2 IMPLANT
DRSG OPSITE POSTOP 4X6 (GAUZE/BANDAGES/DRESSINGS) ×2 IMPLANT
DURAPREP 6ML APPLICATOR 50/CS (WOUND CARE) ×2 IMPLANT
ELECT COATED BLADE 2.86 ST (ELECTRODE) ×2 IMPLANT
ELECT REM PT RETURN 9FT ADLT (ELECTROSURGICAL) ×2
ELECTRODE REM PT RTRN 9FT ADLT (ELECTROSURGICAL) ×1 IMPLANT
GAUZE SPONGE 4X4 12PLY STRL (GAUZE/BANDAGES/DRESSINGS) ×2 IMPLANT
GAUZE SPONGE 4X4 16PLY XRAY LF (GAUZE/BANDAGES/DRESSINGS) IMPLANT
GLOVE ECLIPSE 9.0 STRL (GLOVE) ×2 IMPLANT
GLOVE EXAM NITRILE LRG STRL (GLOVE) IMPLANT
GLOVE EXAM NITRILE XL STR (GLOVE) IMPLANT
GLOVE EXAM NITRILE XS STR PU (GLOVE) IMPLANT
GOWN STRL REUS W/ TWL LRG LVL3 (GOWN DISPOSABLE) IMPLANT
GOWN STRL REUS W/ TWL XL LVL3 (GOWN DISPOSABLE) IMPLANT
GOWN STRL REUS W/TWL 2XL LVL3 (GOWN DISPOSABLE) IMPLANT
GOWN STRL REUS W/TWL LRG LVL3 (GOWN DISPOSABLE)
GOWN STRL REUS W/TWL XL LVL3 (GOWN DISPOSABLE)
HALTER HD/CHIN CERV TRACTION D (MISCELLANEOUS) ×2 IMPLANT
HEMOSTAT POWDER KIT SURGIFOAM (HEMOSTASIS) ×2 IMPLANT
KIT BASIN OR (CUSTOM PROCEDURE TRAY) ×2 IMPLANT
KIT ROOM TURNOVER OR (KITS) ×2 IMPLANT
NEEDLE SPNL 20GX3.5 QUINCKE YW (NEEDLE) ×2 IMPLANT
NS IRRIG 1000ML POUR BTL (IV SOLUTION) ×2 IMPLANT
PACK LAMINECTOMY NEURO (CUSTOM PROCEDURE TRAY) ×2 IMPLANT
PAD ARMBOARD 7.5X6 YLW CONV (MISCELLANEOUS) ×6 IMPLANT
PLATE 4 82.5XLCK NS SPNE CVD (Plate) ×1 IMPLANT
PLATE 4 ATLANTIS TRANS (Plate) ×1 IMPLANT
RUBBERBAND STERILE (MISCELLANEOUS) ×4 IMPLANT
SCREW ST 13X4XST VA NS SPNE (Screw) ×1 IMPLANT
SCREW ST FIX 4 ATL 3120213 (Screw) ×18 IMPLANT
SCREW ST VAR 4 ATL (Screw) ×1 IMPLANT
SPACER SPNL 11X14X6XPEEK CVD (Cage) ×1 IMPLANT
SPACER SPNL 11X14X7XPEEK CVD (Cage) ×2 IMPLANT
SPCR SPNL 11X14X6XPEEK CVD (Cage) ×1 IMPLANT
SPCR SPNL 11X14X7XPEEK CVD (Cage) ×2 IMPLANT
SPONGE INTESTINAL PEANUT (DISPOSABLE) ×2 IMPLANT
SPONGE SURGIFOAM ABS GEL 100 (HEMOSTASIS) ×2 IMPLANT
SPONGE SURGIFOAM ABS GEL SZ50 (HEMOSTASIS) ×2 IMPLANT
STRIP CLOSURE SKIN 1/2X4 (GAUZE/BANDAGES/DRESSINGS) ×2 IMPLANT
SUT VIC AB 3-0 SH 8-18 (SUTURE) ×2 IMPLANT
SUT VIC AB 4-0 RB1 18 (SUTURE) ×4 IMPLANT
TAPE CLOTH 4X10 WHT NS (GAUZE/BANDAGES/DRESSINGS) ×2 IMPLANT
TAPE CLOTH SURG 4X10 WHT LF (GAUZE/BANDAGES/DRESSINGS) ×2 IMPLANT
TOWEL OR 17X24 6PK STRL BLUE (TOWEL DISPOSABLE) ×2 IMPLANT
TOWEL OR 17X26 10 PK STRL BLUE (TOWEL DISPOSABLE) ×2 IMPLANT
TRAP SPECIMEN MUCOUS 40CC (MISCELLANEOUS) ×2 IMPLANT
WATER STERILE IRR 1000ML POUR (IV SOLUTION) ×2 IMPLANT

## 2016-09-07 NOTE — Telephone Encounter (Signed)
Pt scheduled for surgery but diabetes is uncontrolled, they routed me a message as FYI. He was previously following with Dr Ivan AnchorsHommel, I have not had a dedicated diabetes visit with this patient (only seen him once for an eye complaint), can we schedule him for a 30-minute followup for diabetes? Thanks.

## 2016-09-07 NOTE — Op Note (Signed)
Date of procedure: 09/07/2016  Date of dictation: Same  Service: Neurosurgery  Preoperative diagnosis: C3-4, C4-5, C5-6, C6-7 stenosis with myelopathy  Postoperative diagnosis: Same  Procedure Name: C3-4, C4-5, C5-6, C6-7 anterior cervical discectomy with interbody fusion utilizing interbody peak cages, locally harvested autograft, and anterior plate instrumentation  Surgeon:Marylene Masek A.Abrham Maslowski, M.D.  Asst. Surgeon: Lovell SheehanJenkins  Anesthesia: General  Indication: 50 year old male with severe left upper extremity weakness and sensory loss with bilateral lower extremity gait changes and spasticity. Workup demonstrates evidence of severe multilevel spondylosis and stenosis with signal change within the spinal cord at multiple levels. Patient presents now for 4 level anterior cervical decompression and fusion in hopes of improving his symptoms.  Operative note: After induction of anesthesia, patient positioned supine with neck slightly extended and held in place with Halter traction. Anterior cervical region prepped and draped sterilely. Incision made overlying C5. Dissection performed on the right side. Retractor placed. Fluoroscopy used. Levels confirmed. Disc spaces at all 4 levels were then incised with 15 blade. Discectomy performed using various instruments down to level of the posterior annulus. Microscope brought into the field used throughout the remainder of the discectomy. Remaining aspects of annulus osteophytes removed using high-speed drill. Underlying posterior [place] ligament was elevated. This is then resected using Kerrison rongeurs. A wide central decompression was then performed by undercutting the bodies of C3 and C4. Decompression then proceeded into each neural foramen. Anterior foraminotomies were performed on the course exiting nerve roots. Her seizures then repeated at C4-5, C5-6 and C6-7 oh without complications. Wound is irrigated without like solution. Medtronic anatomic peek cages  were packed with locally harvested autograft. Cages were then impacted into place at all 4 levels. Each cage was recessed slightly from the anterior cortical margin. Atlantis translational anterior cervical plate was then placed over the C3-C7 levels. This is an attached under fluoroscopic guidance using 13 mm screws. All screws again final tightening and found to be solidly within the bone. Locking screws were engaged at all levels. Final images reveal good position of the cages and the bone grafts and the instrumentation at the proper upper level with normal alignment of the spine. Wound was then irrigated one final time. Hemostasis was assured with the bipolar cautery. Wound was then closed in layers. There is strips and sterile dressing were applied. No apparent complications. Patient tolerated the procedure well and he returns to the recovery room postoperatively

## 2016-09-07 NOTE — Telephone Encounter (Signed)
-----   Message from Cathlyn ParsonsAngela M Kabbe, NP sent at 09/04/2016  4:13 PM EDT ----- Dear Dr. Lyn HollingsheadAlexander,  I am a NP who reviews charts for the anesthesiologist at [facility].  Your pt, Jordan Taylor, is scheduled for cervical spine surgery with Dr. Jordan LikesPool on 09/07/16.  A hgbA1c was done as part of his pre-admission testing and found to be 9.8.  I am routing this result to you for follow up purposes.   Best regards, Rica Mastngela Kabbe, FNP-BC [facility]Anesthesiology Phone: [phone] 09/04/2016 4:12 PM

## 2016-09-07 NOTE — Anesthesia Postprocedure Evaluation (Signed)
Anesthesia Post Note  Patient: Jordan Taylor  Procedure(s) Performed: Procedure(s) (LRB): Anterior Cervical Diskectomy Fusion - Cervical three- Cervical four - Cervical four - Cervical five - Cervical five- Cervical six  - Cervical six - Cervical seven (N/A)  Patient location during evaluation: PACU Anesthesia Type: General Level of consciousness: awake and alert and patient cooperative Pain management: pain level controlled Vital Signs Assessment: post-procedure vital signs reviewed and stable Respiratory status: spontaneous breathing and respiratory function stable Cardiovascular status: stable Anesthetic complications: no    Last Vitals:  Vitals:   09/07/16 1427 09/07/16 1451  BP: 130/83 125/60  Pulse: 97 93  Resp: 18 18  Temp:  36.7 C    Last Pain:  Vitals:   09/07/16 1400  TempSrc:   PainSc: Asleep                 Eh Sesay S

## 2016-09-07 NOTE — Transfer of Care (Signed)
Immediate Anesthesia Transfer of Care Note  Patient: Jordan Taylor  Procedure(s) Performed: Procedure(s): Anterior Cervical Diskectomy Fusion - Cervical three- Cervical four - Cervical four - Cervical five - Cervical five- Cervical six  - Cervical six - Cervical seven (N/A)  Patient Location: PACU  Anesthesia Type:General  Level of Consciousness: awake, oriented and patient cooperative  Airway & Oxygen Therapy: Patient Spontanous Breathing and Patient connected to nasal cannula oxygen  Post-op Assessment: Report given to RN, Post -op Vital signs reviewed and stable and Patient moving all extremities  Post vital signs: Reviewed and stable  Last Vitals:  Vitals:   09/07/16 0722  BP: (!) 145/87  Pulse: 92  Resp: 20  Temp: 36.9 C    Last Pain:  Vitals:   09/07/16 0742  TempSrc:   PainSc: 3       Patients Stated Pain Goal: 3 (09/07/16 0742)  Complications: No apparent anesthesia complications

## 2016-09-07 NOTE — H&P (Signed)
Jordan Taylor is an 50 y.o. male.   Chief Complaint: Left upper extremity weakness HPI: 50 year old male with severe progressive left upper extremity weakness with associated sensory loss. Patient also with gait abnormality progressively worsening over the past 6 months as well. Workup demonstrates evidence of critical multilevel cervical spondylosis with stenosis and severe spinal cord compression with signal change. Patient presents now for 4 level anterior cervical decompression infusion in hopes of improving his symptoms.  Past Medical History:  Diagnosis Date  . Essential hypertension 12/04/2013  . Type 2 diabetes mellitus (HCC) 12/04/2013    Past Surgical History:  Procedure Laterality Date  . KNEE ARTHROSCOPY Left   . spinal cord surgery     20+ yrs ago    Family History  Problem Relation Age of Onset  . Alcohol abuse Mother   . Cancer Mother   . Alcohol abuse Father   . Diabetes Father   . Hypertension Father   . Cancer Other    Social History:  reports that he has quit smoking. He has never used smokeless tobacco. He reports that he does not drink alcohol or use drugs.  Allergies: No Known Allergies  Medications Prior to Admission  Medication Sig Dispense Refill  . amLODipine (NORVASC) 5 MG tablet Take 5 mg by mouth daily.    . Dapagliflozin-Metformin HCl ER 08-999 MG TB24 Take 1 tablet by mouth daily. 30 tablet 11  . glipiZIDE (GLUCOTROL XL) 5 MG 24 hr tablet Take 1 tablet (5 mg total) by mouth daily with breakfast. (Patient taking differently: Take 5 mg by mouth at bedtime. ) 90 tablet 1  . amLODipine (NORVASC) 10 MG tablet Take 1 tablet (10 mg total) by mouth daily. NEED FOLLOW UP APPOINTMENT FOR MORE REFILLS (Patient taking differently: Take 5 mg by mouth daily. ) 30 tablet 0  . amLODipine (NORVASC) 5 MG tablet Take 1 tablet (5 mg total) by mouth daily. (Patient not taking: Reported on 08/31/2016) 90 tablet 1  . benazepril (LOTENSIN) 20 MG tablet TAKE 1 TABLET (20  MG TOTAL) BY MOUTH DAILY. 90 tablet 1  . linagliptin (TRADJENTA) 5 MG TABS tablet Take 1 tablet (5 mg total) by mouth daily. 30 tablet 11  . loratadine-pseudoephedrine (CLARITIN-D 24 HOUR) 10-240 MG 24 hr tablet Take 1 tablet by mouth daily. (Patient taking differently: Take 1 tablet by mouth daily as needed for allergies. ) 30 tablet 2  . phentermine (ADIPEX-P) 37.5 MG tablet Take 1 tablet (37.5 mg total) by mouth daily before breakfast. 30 tablet 0  . pravastatin (PRAVACHOL) 20 MG tablet Take 1 tablet (20 mg total) by mouth every evening. 30 tablet 11  . sildenafil (REVATIO) 20 MG tablet TAKE 1 TO 4 TABLETS BY MOUTH DAILY AS NEEDED FOR SEX 50 tablet 4  . sildenafil (VIAGRA) 100 MG tablet Take 0.5-1 tablets (50-100 mg total) by mouth daily as needed for erectile dysfunction. (Patient not taking: Reported on 08/31/2016) 6 tablet 11    Results for orders placed or performed during the hospital encounter of 09/07/16 (from the past 48 hour(s))  Glucose, capillary     Status: Abnormal   Collection Time: 09/07/16  7:21 AM  Result Value Ref Range   Glucose-Capillary 168 (H) 65 - 99 mg/dL   No results found.  Pertinent items noted in HPI and remainder of comprehensive ROS otherwise negative.  Blood pressure (!) 145/87, pulse 92, temperature 98.4 F (36.9 C), temperature source Oral, resp. rate 20, height 5\' 5"  (1.651 m), weight 93.9  kg (207 lb), SpO2 99 %.  Patient is awake and alert. He is oriented and appropriate. His speech is fluent. His judgment and insight are intact. Cranial nerve function intact bilaterally. Motor examination reveals weakness of his left deltoid muscle group grating out at 4/5. He has weakness of his left biceps grating out of 4/5. He has profound weakness of his left triceps muscle group grating out at 2/5. He has absent left grip strength and intrinsic strength in his left hand. He has mild right-sided intrinsic weakness in his right hand. He has spasticity in both lower  extremities left greater than right. Sensory examination reveals a relative sensory level at C5 on the left. Deep tendon reflexes are hyperactive. He has Hoffmann's responses in both hands. Toes are upgoing to plantar stimulation. Examination head ears eyes nose and throat is unremarkable. Chest and abdomen are benign. Extremities are free from injury deformity. Assessment/Plan Severe cervical stenosis with myelopathy. Plan C3-4, C4-5, C5-6 ACDF with interbody cages and anterior plate instrumentation. Risks and benefits of been explained. Patient wishes to proceed.  Synia Douglass A 09/07/2016, 9:19 AM

## 2016-09-07 NOTE — Brief Op Note (Signed)
09/07/2016  12:40 PM  PATIENT:  Jordan Taylor  50 y.o. male  PRE-OPERATIVE DIAGNOSIS:  Stenosis  POST-OPERATIVE DIAGNOSIS:  Stenosis  PROCEDURE:  Procedure(s): Anterior Cervical Diskectomy Fusion - Cervical three- Cervical four - Cervical four - Cervical five - Cervical five- Cervical six  - Cervical six - Cervical seven (N/A)  SURGEON:  Surgeon(s) and Role:    * Julio SicksHenry Jilliane Kazanjian, MD - Primary  PHYSICIAN ASSISTANT:   ASSISTANTS: Jenkins   ANESTHESIA:   general  EBL:  Total I/O In: 800 [I.V.:800] Out: 75 [Blood:75]  BLOOD ADMINISTERED:none  DRAINS: none   LOCAL MEDICATIONS USED:  NONE  SPECIMEN:  No Specimen  DISPOSITION OF SPECIMEN:  N/A  COUNTS:  YES  TOURNIQUET:  * No tourniquets in log *  DICTATION: .Dragon Dictation  PLAN OF CARE: Admit to inpatient   PATIENT DISPOSITION:  PACU - hemodynamically stable.   Delay start of Pharmacological VTE agent (>24hrs) due to surgical blood loss or risk of bleeding: yes

## 2016-09-07 NOTE — Anesthesia Preprocedure Evaluation (Addendum)
Anesthesia Evaluation  Patient identified by MRN, date of birth, ID band Patient awake    Reviewed: Allergy & Precautions, NPO status , Patient's Chart, lab work & pertinent test results  Airway Mallampati: II  TM Distance: >3 FB Neck ROM: full    Dental  (+) Teeth Intact, Dental Advisory Given   Pulmonary former smoker,    breath sounds clear to auscultation       Cardiovascular hypertension, Pt. on medications  Rhythm:regular Rate:Normal     Neuro/Psych    GI/Hepatic   Endo/Other  diabetes, Type 2  Renal/GU      Musculoskeletal   Abdominal   Peds  Hematology   Anesthesia Other Findings   Reproductive/Obstetrics                            Anesthesia Physical Anesthesia Plan  ASA: II  Anesthesia Plan: General   Post-op Pain Management:    Induction: Intravenous  Airway Management Planned: Oral ETT  Additional Equipment:   Intra-op Plan:   Post-operative Plan: Extubation in OR  Informed Consent: I have reviewed the patients History and Physical, chart, labs and discussed the procedure including the risks, benefits and alternatives for the proposed anesthesia with the patient or authorized representative who has indicated his/her understanding and acceptance.   Dental advisory given  Plan Discussed with: CRNA, Anesthesiologist and Surgeon  Anesthesia Plan Comments:        Anesthesia Quick Evaluation

## 2016-09-07 NOTE — Anesthesia Procedure Notes (Signed)
Procedure Name: Intubation Date/Time: 09/07/2016 9:41 AM Performed by: Charm BargesBUTLER, Nicholes Hibler R Pre-anesthesia Checklist: Patient identified, Emergency Drugs available, Suction available and Patient being monitored Patient Re-evaluated:Patient Re-evaluated prior to inductionOxygen Delivery Method: Circle System Utilized Preoxygenation: Pre-oxygenation with 100% oxygen Intubation Type: IV induction, Rapid sequence and Cricoid Pressure applied Ventilation: Mask ventilation without difficulty Laryngoscope Size: Mac and 4 Grade View: Grade I Tube type: Oral Tube size: 8.0 mm Number of attempts: 1 Airway Equipment and Method: Stylet and Oral airway Placement Confirmation: ETT inserted through vocal cords under direct vision,  positive ETCO2 and breath sounds checked- equal and bilateral Secured at: 23 cm Tube secured with: Tape Dental Injury: Teeth and Oropharynx as per pre-operative assessment

## 2016-09-08 ENCOUNTER — Encounter (HOSPITAL_COMMUNITY): Payer: Self-pay | Admitting: Neurosurgery

## 2016-09-08 ENCOUNTER — Other Ambulatory Visit: Payer: Self-pay | Admitting: *Deleted

## 2016-09-08 LAB — GLUCOSE, CAPILLARY: Glucose-Capillary: 193 mg/dL — ABNORMAL HIGH (ref 65–99)

## 2016-09-08 MED ORDER — HYDROCODONE-ACETAMINOPHEN 5-325 MG PO TABS: 1.0000 | ORAL_TABLET | ORAL | 0 refills | Status: DC | PRN

## 2016-09-08 MED ORDER — CYCLOBENZAPRINE HCL 10 MG PO TABS: 10.0000 mg | ORAL_TABLET | Freq: Three times a day (TID) | ORAL | 0 refills | Status: DC | PRN

## 2016-09-08 MED FILL — Thrombin For Soln 20000 Unit: CUTANEOUS | Qty: 1 | Status: AC

## 2016-09-08 MED FILL — CYCLOBENZAPRINE 10 MG TAB: 10 | 10 days supply | Qty: 30 | Fill #0

## 2016-09-08 MED FILL — HYDROCODON-APAP 5-325: 5-325 | 5 days supply | Qty: 60 | Fill #0

## 2016-09-08 NOTE — Discharge Instructions (Signed)

## 2016-09-08 NOTE — Progress Notes (Signed)
Inpatient Diabetes Program Recommendations  AACE/ADA: New Consensus Statement on Inpatient Glycemic Control (2015)  Target Ranges:  Prepandial:   less than 140 mg/dL      Peak postprandial:   less than 180 mg/dL (1-2 hours)      Critically ill patients:  140 - 180 mg/dL   Lab Results  Component Value Date   GLUCAP 193 (H) 09/08/2016   HGBA1C 9.8 (H) 09/02/2016    Review of Glycemic Control  Inpatient Diabetes Program Recommendations:   Spoke with pt and wife @ bedside about A1C results and explained what an A1C is, basic pathophysiology of DM Type 2, basic home care, basic diabetes diet nutrition principles, importance of checking CBGs and maintaining good CBG control to prevent long-term and short-term complications. Reviewed signs and symptoms of hyperglycemia and hypoglycemia and how to treat hypoglycemia at home. Also reviewed blood sugar goals at home.  Patient states his A1c was within normal range until taking steroids. Gave handout on A1c and basic nutrition plate method.  Thank you, Billy FischerJudy E. Tremane Spurgeon, RN, MSN, CDE Inpatient Glycemic Control Team Team Pager [phone] (8am-5pm) 09/08/2016 11:04 AM

## 2016-09-08 NOTE — Discharge Summary (Signed)
Physician Discharge Summary  Patient ID: Jordan Taylor MRN: [id number] DOB/AGE: 11-30-1966 50 y.o.  Admit date: 09/07/2016 Discharge date: 09/08/2016  Admission Diagnoses:  Discharge Diagnoses:  Active Problems:   Cervical stenosis of spinal canal   Discharged Condition: good  Hospital Course: Patient admitted to hospital where he underwent an uncomplicated 4 level anterior cervical decompression infusion. Postoperative patient very much improved. Preoperative left upper extremity weakness significantly better. Patient now with 3/5 grip strength in his left hand. He has some trace intrinsic function is left-hand which he did not have preop. His triceps muscle group is now 2-3/5 which is significantly better than preop. Right upper extremity symptoms are completely resolved. He has no lower extremity dysfunction. Overall patient is very happy with his progress.  Consults:   Significant Diagnostic Studies:   Treatments:   Discharge Exam: Blood pressure 110/85, pulse 93, temperature 98.3 F (36.8 C), temperature source Oral, resp. rate 18, height 5\' 5"  (1.651 m), weight 93.9 kg (207 lb), SpO2 96 %. Patient is awake and alert. He is oriented and appropriate. Cranial nerve function is intact except patient is mildly hoarse. Examination of his upper extremity strength reveals 5 over 5 deltoid and biceps strength bilaterally. Wrist extensors 4+ over 5 on the left and normal on the right. Right grip and intrinsics 5 over 5. Left grip 3/5 left intrinsics 2/5. Left triceps 3/5. Bilateral lower extremities 5 over 5. Wound clean and dry. Chest and abdomen benign.  Disposition: Final discharge disposition not confirmed     Medication List    TAKE these medications   amLODipine 5 MG tablet Commonly known as:  NORVASC Take 5 mg by mouth daily. What changed:  Another medication with the same name was removed. Continue taking this medication, and follow the directions you see here.    amLODipine 5 MG tablet Commonly known as:  NORVASC Take 1 tablet (5 mg total) by mouth daily. What changed:  Another medication with the same name was removed. Continue taking this medication, and follow the directions you see here.   benazepril 20 MG tablet Commonly known as:  LOTENSIN TAKE 1 TABLET (20 MG TOTAL) BY MOUTH DAILY.   cyclobenzaprine 10 MG tablet Commonly known as:  FLEXERIL Take 1 tablet (10 mg total) by mouth 3 (three) times daily as needed for muscle spasms.   Dapagliflozin-Metformin HCl ER 08-999 MG Tb24 Take 1 tablet by mouth daily.   glipiZIDE 5 MG 24 hr tablet Commonly known as:  GLUCOTROL XL Take 1 tablet (5 mg total) by mouth daily with breakfast. What changed:  when to take this   HYDROcodone-acetaminophen 5-325 MG tablet Commonly known as:  NORCO/VICODIN Take 1-2 tablets by mouth every 4 (four) hours as needed (mild pain).   linagliptin 5 MG Tabs tablet Commonly known as:  TRADJENTA Take 1 tablet (5 mg total) by mouth daily.   loratadine-pseudoephedrine 10-240 MG 24 hr tablet Commonly known as:  CLARITIN-D 24 HOUR Take 1 tablet by mouth daily. What changed:  when to take this  reasons to take this   phentermine 37.5 MG tablet Commonly known as:  ADIPEX-P Take 1 tablet (37.5 mg total) by mouth daily before breakfast.   pravastatin 20 MG tablet Commonly known as:  PRAVACHOL Take 1 tablet (20 mg total) by mouth every evening.   sildenafil 100 MG tablet Commonly known as:  VIAGRA Take 0.5-1 tablets (50-100 mg total) by mouth daily as needed for erectile dysfunction.   sildenafil 20 MG tablet Commonly known  as:  REVATIO TAKE 1 TO 4 TABLETS BY MOUTH DAILY AS NEEDED FOR SEX      Follow-up Information    Temple PaciniPOOL,Shawntez Dickison A, MD .   Specialty:  Neurosurgery Contact information: [address] [place] [address] [phone]           Signed: Temple PaciniOOL,Tomorrow Dehaas A 09/08/2016, 10:50 AM

## 2016-09-08 NOTE — Progress Notes (Signed)
Pt and wife given D/C instructions with Rx's, verbal understanding was provided. Pt's IV was removed prior to D/C. Pt's incision is clean and dry with no sign of infection. Pt D/C'd home via wheelchair @ 1115 per MD order. Pt is stable @ D/C and has no other needs at this time. Rema FendtAshley Corderius Saraceni, RN

## 2016-09-09 ENCOUNTER — Other Ambulatory Visit: Payer: Self-pay | Admitting: *Deleted

## 2016-09-09 NOTE — Patient Outreach (Signed)
Left message on wife's ([name]) cell phone in attempt to assess patient's recovery from cervical spine surgery on 10/9. Advised [name] on the voice mail  this RNCM will call back tomorrow at 10:00 am. Bary RichardJanet S. Hauser RN,CCM,CDE [facility] Care Management Coordinator [facility] Office Phone [phone] Office Fax [phone]

## 2016-09-09 NOTE — Patient Outreach (Signed)
First transition of care phone call attempt unsuccessful. Jordan Taylor had cervical spine surgery on 09/07/16 and was discharged from the hospital on 09/08/16.  Left message with request to return phone cell to assess his recovery. Jordan RichardJanet S. Hauser RN,CCM,CDE [facility] Care Management Coordinator [facility] Office Phone [phone] Office Fax [phone]

## 2016-09-09 NOTE — Patient Outreach (Signed)
Patient had cervical spine surgery on 10/9 and was discharged from the  hospital this morning. Will make first transition of care telephone call tomorrow. Bary RichardJanet S. Hauser RN,CCM,CDE [facility] Care Management Coordinator [facility] Office Phone [phone] Office Fax [phone]

## 2016-09-10 ENCOUNTER — Other Ambulatory Visit: Payer: Self-pay | Admitting: *Deleted

## 2016-09-10 NOTE — Patient Outreach (Signed)
Attempted to reach patient and his wife via their respective cell phones without success to complete the transition of care call. Left message for both Nida and Dorsie requesting a call back to order to assess Jaxn' recover from cervical spine surgery on 09/07/16. Bary RichardJanet S. Hauser RN,CCM,CDE [facility] Care Management Coordinator [facility] Office Phone [phone] Office Fax [phone]

## 2016-09-10 NOTE — Patient Outreach (Signed)
Patient's wife [name] returned call as patient is sleeping, transition of care call completed. See template for details.  Discussed Hgb A1C results drawn preoperatively and importance of checking blood sugars. [name] states to date, he has not checked his blood sugars or allowed her to check them. After reviewing medications, suggested that blood sugars be checked before and after the largest meal of the day and occasionally a fasting and take blood sugar log to MD office visit on 10/26 so she can adjust medications based on blood sugar results.  [name] says he has good relief of his pain but he is taking the narcotic pain  medicine every times he can. She says she is encouraging him to take less so he can get up and move about. Discussed side effects of narcotic pain medicines including constipation and, drowsiness and lethargy. [name] says he has a honeycomb dressing applied to the surgical incision with steri strips and he is wearing a soft cervical collar. She says the numbness in his hand has  improved since the surgery. Advised [name] this RNCM will call back next week for ongoing assessment of Patients' recovery from cervical spine surgery. Bary RichardJanet S. Hauser RN,CCM,CDE [facility] Care Management Coordinator [facility] Office Phone [phone] Office Fax [phone]

## 2016-09-16 MED FILL — HYDROCODON-APAP 5-325: 5-325 | 5 days supply | Qty: 60 | Fill #0

## 2016-09-16 MED FILL — CYCLOBENZAPRINE 10 MG TAB: 10 | 10 days supply | Qty: 30 | Fill #0

## 2016-09-18 ENCOUNTER — Encounter: Payer: Self-pay | Admitting: *Deleted

## 2016-09-18 ENCOUNTER — Other Ambulatory Visit: Payer: Self-pay | Admitting: *Deleted

## 2016-09-18 NOTE — Patient Outreach (Signed)
Spoke with Jordan Taylor to assess ongoing recovery from cervical spine surgery on 09/07/16 and to complete second transition of care phone call. Jordan Taylor says he is doing well, still requiring narcotic pain medicine for surgical pain and flexeril but says he is taking less of the medication than he was last week. He denies bowel or bladder issues. Says the neuropathy in his arm and hand have improved since surgery but have not completely resolved. He saw his surgeon in follow up and no problems were identified.  Jordan Taylor say his fasting blood sugar today was 189 7 and his post lunch blood sugar was 228. Reviewed his current DM medications and assessed adherence. Reinforced the importance of taking his blood sugar readings with him to his primary  MD on 10/25 so she can adjust his medications to improve his glycemic control .  Advised Jordan Taylor this RNCM will call him next week for update on his ongoing recovery from cervical spine surgery. Bary RichardJanet S. Hauser RN,CCM,CDE [facility] Care Management Coordinator [facility] Office Phone [phone] Office Fax [phone]

## 2016-09-24 ENCOUNTER — Ambulatory Visit (INDEPENDENT_AMBULATORY_CARE_PROVIDER_SITE_OTHER): Payer: 59 | Admitting: Osteopathic Medicine

## 2016-09-24 ENCOUNTER — Encounter: Payer: Self-pay | Admitting: Osteopathic Medicine

## 2016-09-24 VITALS — BP 158/94 | HR 97 | Ht 65.0 in | Wt 199.0 lb

## 2016-09-24 DIAGNOSIS — E119 Type 2 diabetes mellitus without complications: Secondary | ICD-10-CM

## 2016-09-24 DIAGNOSIS — E291 Testicular hypofunction: Secondary | ICD-10-CM

## 2016-09-24 DIAGNOSIS — I10 Essential (primary) hypertension: Secondary | ICD-10-CM

## 2016-09-24 DIAGNOSIS — N529 Male erectile dysfunction, unspecified: Secondary | ICD-10-CM

## 2016-09-24 DIAGNOSIS — R635 Abnormal weight gain: Secondary | ICD-10-CM | POA: Diagnosis not present

## 2016-09-24 DIAGNOSIS — Z23 Encounter for immunization: Secondary | ICD-10-CM | POA: Diagnosis not present

## 2016-09-24 MED ORDER — CYCLOBENZAPRINE HCL 10 MG PO TABS: 10.0000 mg | ORAL_TABLET | Freq: Three times a day (TID) | ORAL | 0 refills | Status: DC | PRN

## 2016-09-24 MED ORDER — DAPAGLIFLOZIN PRO-METFORMIN ER 10-1000 MG PO TB24: 1.0000 | ORAL_TABLET | Freq: Every day | ORAL | 1 refills | Status: DC

## 2016-09-24 MED ORDER — GLIPIZIDE ER 10 MG PO TB24: 10.0000 mg | ORAL_TABLET | Freq: Every day | ORAL | 1 refills | Status: DC

## 2016-09-24 MED ORDER — SILDENAFIL CITRATE 20 MG PO TABS: ORAL_TABLET | ORAL | 4 refills | Status: DC

## 2016-09-24 MED FILL — XIGDUO XR 10 MG-1,000 MG TA: 10-1000 | 90 days supply | Qty: 90 | Fill #0

## 2016-09-24 MED FILL — SILDENAFIL 20 MG TABLET: 20 | 30 days supply | Qty: 50 | Fill #0

## 2016-09-24 MED FILL — CYCLOBENZAPRINE 10 MG TAB: 10 | 10 days supply | Qty: 30 | Fill #0

## 2016-09-24 NOTE — Progress Notes (Signed)
HPI: Jordan Taylor is a 50 y.o. male  who presents to [facility] today, 09/24/16,  for chief complaint of:  Chief Complaint  Patient presents with  . Establish Care    Switching from [place]    Diabetes: I Was contacted by his surgical team due to elevated A1c. Patient admits to be doing maybe a bit better with diet/exercise, recent surgery has impacted his ability to be active. Patient is on medications as noted below, compliant with medication regimen, meeting several times per year with diabetes educator.  DIABETES SCREENING/PREVENTIVE CARE: A1C past 3-6 mos: Yes  controlled? No  BP goal <140/90: No  LDL goal <70: need labs updated Eye exam annually: Yes , importance discussed with patient Foot exam: will do next visit Microalbuminuria:No  Metformin: Yes  ACE/ARB: No  Antiplatelet if ASCVD Risk >10%: No  Statin: Yes    Obesity: Patient previously on phentermine from Dr. Ivan Anchors, no history of elevated blood pressure though BP was a bit high on initial check in the office today.   Hypertension: No chest pain, pressure, shortness of breath. Comply with medications as noted below.    Past medical, surgical, social and family history reviewed: Past Medical History:  Diagnosis Date  . Essential hypertension 12/04/2013  . Type 2 diabetes mellitus (HCC) 12/04/2013   Past Surgical History:  Procedure Laterality Date  . ANTERIOR CERVICAL DECOMPRESSION/DISCECTOMY FUSION 4 LEVELS N/A 09/07/2016   Procedure: Anterior Cervical Diskectomy Fusion - Cervical three- Cervical four - Cervical four - Cervical five - Cervical five- Cervical six  - Cervical six - Cervical seven;  Surgeon: Julio Sicks, MD;  Location: [facility] OR;  Service: Neurosurgery;  Laterality: N/A;  . KNEE ARTHROSCOPY Left   . spinal cord surgery     20+ yrs ago   Social History  Substance Use Topics  . Smoking status: Former Games developer  . Smokeless tobacco: Never Used  . Alcohol use No    Family History  Problem Relation Age of Onset  . Alcohol abuse Mother   . Cancer Mother   . Alcohol abuse Father   . Diabetes Father   . Hypertension Father   . Cancer Other      Current medication list and allergy/intolerance information reviewed:   Current Outpatient Prescriptions on File Prior to Visit  Medication Sig Dispense Refill  . amLODipine (NORVASC) 5 MG tablet Take 1 tablet (5 mg total) by mouth daily. 90 tablet 1  . benazepril (LOTENSIN) 20 MG tablet TAKE 1 TABLET (20 MG TOTAL) BY MOUTH DAILY. 90 tablet 1  . cyclobenzaprine (FLEXERIL) 10 MG tablet Take 1 tablet (10 mg total) by mouth 3 (three) times daily as needed for muscle spasms. 30 tablet 0  . Dapagliflozin-Metformin HCl ER 08-999 MG TB24 Take 1 tablet by mouth daily. 30 tablet 11  . glipiZIDE (GLUCOTROL XL) 5 MG 24 hr tablet Take 1 tablet (5 mg total) by mouth daily with breakfast. (Patient taking differently: Take 5 mg by mouth at bedtime. ) 90 tablet 1  . HYDROcodone-acetaminophen (NORCO/VICODIN) 5-325 MG tablet Take 1-2 tablets by mouth every 4 (four) hours as needed (mild pain). 60 tablet 0  . linagliptin (TRADJENTA) 5 MG TABS tablet Take 1 tablet (5 mg total) by mouth daily. 30 tablet 11  . loratadine-pseudoephedrine (CLARITIN-D 24 HOUR) 10-240 MG 24 hr tablet Take 1 tablet by mouth daily. (Patient taking differently: Take 1 tablet by mouth daily as needed for allergies. ) 30 tablet 2  .  phentermine (ADIPEX-P) 37.5 MG tablet Take 1 tablet (37.5 mg total) by mouth daily before breakfast. 30 tablet 0  . pravastatin (PRAVACHOL) 20 MG tablet Take 1 tablet (20 mg total) by mouth every evening. 30 tablet 11  . sildenafil (REVATIO) 20 MG tablet TAKE 1 TO 4 TABLETS BY MOUTH DAILY AS NEEDED FOR SEX 50 tablet 4   No current facility-administered medications on file prior to visit.    No Known Allergies    Review of Systems:  Constitutional: No recent illness  HEENT: No  headache, no vision change  Cardiac: No   chest pain, No  pressure, No palpitations  Respiratory:  No  shortness of breath. No  Cough  Gastrointestinal: No  abdominal pain, no change on bowel habits  Musculoskeletal: No new myalgia/arthralgia  Skin: No  Rash  Exam:  BP (!) 158/94   Pulse 97   Ht 5\' 5"  (1.651 m)   Wt 199 lb (90.3 kg)   BMI 33.12 kg/m   Constitutional: VS see above. General Appearance: alert, well-developed, well-nourished, NAD  Eyes: Normal lids and conjunctive, non-icteric sclera  Ears, Nose, Mouth, Throat: MMM, Normal external inspection ears/nares/mouth/lips/gums.  Neck: No masses, trachea midline.   Respiratory: Normal respiratory effort. no wheeze, no rhonchi, no rales  Cardiovascular: S1/S2 normal, no murmur, no rub/gallop auscultated. RRR.   Musculoskeletal: Gait normal. Symmetric and independent movement of all extremities  Neurological: Normal balance/coordination. No tremor.  Skin: warm, dry, intact.   Psychiatric: Normal judgment/insight. Normal mood and affect. Oriented x3.      ASSESSMENT/PLAN:   Type 2 diabetes mellitus without complication, without long-term current use of insulin (HCC) - Directly tied would be a good option for her diabetes control in obese patient. May need to consider insulin if A1c doesn't improve - Plan: Dapagliflozin-Metformin HCl ER 08-999 MG TB24, glipiZIDE (GLUCOTROL XL) 10 MG 24 hr tablet  Essential hypertension - Poor control but this is fairly new, patient has home blood pressure monitor, will bring this for verification and if accurate record home BP measurements  Abnormal weight gain  Erectile dysfunction, unspecified erectile dysfunction type - Plan: sildenafil (REVATIO) 20 MG tablet  Hypogonadism male  Need for 23-polyvalent pneumococcal polysaccharide vaccine - Plan: Pneumococcal polysaccharide vaccine 23-valent greater than or equal to 2yo subcutaneous/IM    Patient Instructions  Blood pressure:  Please return to clinic when due for  testosterone recheck. Please bring your blood pressure monitor to that visit so that we can verify it with our equipment here. After that, the keep the records of your home blood pressure for the next 2 weeks, at your next testosterone visit, bring those records with you so that I can review them. If looking okay, can refill phentermine in stay the course with current medications. If elevated at home with verified monitor, will need to talk about increasing/changing blood pressure medications.     Obesity:  Things to remember for exercise for weight loss: Gradually increasing the intensity, frequency, or duration of your workout. Increased interval training and muscle strengthening exercises will help overall fitness and weight loss.   Things to remember for diet changes for weight loss: Calorie restriction with the goal weight loss of no more than one to one and a half pounds per week. Increase lean protein such as chicken, fish, [place]. Decrease fatty foods such as dairy, better. Decrease sugary foods. Increase fiber founded fruit and vegetables. Avoid sugary drinks such as soda or juice.  Medications approved for long-term use for  obesity  Qsymia (Phentermine and Topiramate)  Saxenda (Liraglutide 3 mg/day)*  Contrave (Bupropion and Naltrexone)  Lorcaserin (Belviq or Belviq XR)  Orlistat (Xenical, Alli)  Bupropion (Wellbutrin) We will need to consider transition to one of the above medications if you're interested in continuing long-term treatment with medications. I recommend that you research the above medications and see which one(s) your insurance may or may not cover: If you call your insurance, ask them specifically what medications are on their formulary that are approved for obesity treatment. They should be able to send you a list or tell you over the phone.      Visit summary with medication list and pertinent instructions was printed for patient to review. All questions at  time of visit were answered - patient instructed to contact office with any additional concerns. ER/RTC precautions were reviewed with the patient. Follow-up plan: Return in about 3 months (around 12/25/2016) for DIABTES FOLLOW UP .

## 2016-09-24 NOTE — Patient Instructions (Addendum)
Blood pressure:  Please return to clinic when due for testosterone recheck. Please bring your blood pressure monitor to that visit so that we can verify it with our equipment here. After that, the keep the records of your home blood pressure for the next 2 weeks, at your next testosterone visit, bring those records with you so that I can review them. If looking okay, can refill phentermine in stay the course with current medications. If elevated at home with verified monitor, will need to talk about increasing/changing blood pressure medications.     Obesity:  Things to remember for exercise for weight loss: Gradually increasing the intensity, frequency, or duration of your workout. Increased interval training and muscle strengthening exercises will help overall fitness and weight loss.   Things to remember for diet changes for weight loss: Calorie restriction with the goal weight loss of no more than one to one and a half pounds per week. Increase lean protein such as chicken, fish, [place]. Decrease fatty foods such as dairy, better. Decrease sugary foods. Increase fiber founded fruit and vegetables. Avoid sugary drinks such as soda or juice.  Medications approved for long-term use for obesity  Qsymia (Phentermine and Topiramate)  Saxenda (Liraglutide 3 mg/day)*  Contrave (Bupropion and Naltrexone)  Lorcaserin (Belviq or Belviq XR)  Orlistat (Xenical, Alli)  Bupropion (Wellbutrin) We will need to consider transition to one of the above medications if you're interested in continuing long-term treatment with medications. I recommend that you research the above medications and see which one(s) your insurance may or may not cover: If you call your insurance, ask them specifically what medications are on their formulary that are approved for obesity treatment. They should be able to send you a list or tell you over the phone.

## 2016-09-25 ENCOUNTER — Other Ambulatory Visit: Payer: Self-pay | Admitting: *Deleted

## 2016-09-25 NOTE — Patient Outreach (Addendum)
Left message for Jordan Taylor requesting return call if he has new information related to his recovery from cervical spine surgery on 10/3.  Epic record indicates he saw his primary care provider yesterday and no changes were made to his diabetes medication regimen.  His BP was elevated at his visit yesterday so he was asked to monitor his BP at home and bring readings when he returns on 11/19.  Message also advised patient this RNCM will call him again next week for the 4th and final transition of care call. Bary RichardJanet S. Hauser RN,CCM,CDE [facility] Care Management Coordinator [facility] Office Phone [phone] Office Fax [phone]

## 2016-10-02 ENCOUNTER — Other Ambulatory Visit: Payer: Self-pay | Admitting: *Deleted

## 2016-10-02 NOTE — Patient Outreach (Addendum)
Spoke with Jordan Taylor to complete 4th transition of care call. Jordan Taylor says he is doing well, still has some residual weakness and soreness in his left arm but much improved as compared to preoperatively. He says he is bored and ready to go back to work and will likely return the week of Thanksgiving.  He says most of his blood sugars are meeting target and he says he has not checked his BP even though he has a home monitor because he attributes the elevated reading at his office visit with his primary care provider due to pain.  Verified that he and his wife Jordan Taylor ,who are both in the [facility] program , have enrolled in [employer], the disease management program that will replace the [facility] program in 2018. Advised Jordan Taylor this RNCM will call him in about a month to assess his recovery.   Jordan Taylor Jordan S. Hauser RN,CCM,CDE [facility] Care Management Coordinator [facility] Office Phone [phone] Office Fax [phone]

## 2016-10-08 ENCOUNTER — Encounter: Payer: Self-pay | Admitting: Osteopathic Medicine

## 2016-10-08 ENCOUNTER — Ambulatory Visit (INDEPENDENT_AMBULATORY_CARE_PROVIDER_SITE_OTHER): Payer: 59 | Admitting: Osteopathic Medicine

## 2016-10-08 VITALS — BP 111/63 | HR 120 | Ht 65.0 in | Wt 199.0 lb

## 2016-10-08 DIAGNOSIS — L700 Acne vulgaris: Secondary | ICD-10-CM

## 2016-10-08 DIAGNOSIS — M4802 Spinal stenosis, cervical region: Secondary | ICD-10-CM

## 2016-10-08 DIAGNOSIS — I10 Essential (primary) hypertension: Secondary | ICD-10-CM | POA: Diagnosis not present

## 2016-10-08 DIAGNOSIS — E291 Testicular hypofunction: Secondary | ICD-10-CM

## 2016-10-08 DIAGNOSIS — N529 Male erectile dysfunction, unspecified: Secondary | ICD-10-CM

## 2016-10-08 DIAGNOSIS — R29898 Other symptoms and signs involving the musculoskeletal system: Secondary | ICD-10-CM

## 2016-10-08 DIAGNOSIS — E669 Obesity, unspecified: Secondary | ICD-10-CM

## 2016-10-08 DIAGNOSIS — R635 Abnormal weight gain: Secondary | ICD-10-CM

## 2016-10-08 DIAGNOSIS — Z6833 Body mass index (BMI) 33.0-33.9, adult: Secondary | ICD-10-CM | POA: Diagnosis not present

## 2016-10-08 MED ORDER — PHENTERMINE HCL 37.5 MG PO TABS: 37.5000 mg | ORAL_TABLET | Freq: Every day | ORAL | 0 refills | Status: DC

## 2016-10-08 MED FILL — PHENTERMINE 37.5 MG TABLET: 37.5 | 30 days supply | Qty: 30 | Fill #0

## 2016-10-08 NOTE — Progress Notes (Signed)
HPI: Jordan Taylor is a 50 y.o. male  who presents to [name] today, 10/08/16,  for chief complaint of:  Chief Complaint  Patient presents with  . Follow-up    blood pressure     HTN: BP better today. Requests short-term refill of phentermine. Patient did not bring home blood pressure cuff with him. Denies chest pain, pressure, shortness of breath, headache or dizziness.  Skin: New problem. bumps on face. Cortisone 1% was given by wife. Not itching. ?Acne.   Neck/Arm: Cervical fusion recently, still not much strength in the L arm. Has questions about when this will improve. Has upcoming appointment with his spinal surgeon today.  Erectile dysfunction: Previously on medications, these were not helpful. Apparently previous PCP and he discussed referral to urology, patient would like to pursue this. Would also like to get back on testosterone injections. Has been off of these for a month or 2.     Past medical, surgical, social and family history reviewed: Patient Active Problem List   Diagnosis Date Noted  . Cervical stenosis of spinal canal 09/07/2016  . Left arm weakness 06/24/2016  . Hypogonadism in male 02/26/2016  . Erectile dysfunction 07/02/2014  . Obesity 12/04/2013  . Type 2 diabetes mellitus (HCC) 12/04/2013  . Essential hypertension 12/04/2013  . Hyperlipidemia 12/04/2013   Past Surgical History:  Procedure Laterality Date  . ANTERIOR CERVICAL DECOMPRESSION/DISCECTOMY FUSION 4 LEVELS N/A 09/07/2016   Procedure: Anterior Cervical Diskectomy Fusion - Cervical three- Cervical four - Cervical four - Cervical five - Cervical five- Cervical six  - Cervical six - Cervical seven;  Surgeon: Julio SicksHenry Pool, MD;  [facility];  Service: Neurosurgery;  Laterality: N/A;  . KNEE ARTHROSCOPY Left   . spinal cord surgery     20+ yrs ago   Social History  Substance Use Topics  . Smoking status: Former Games developermoker  . Smokeless tobacco: Never  Used  . Alcohol use No   Family History  Problem Relation Age of Onset  . Alcohol abuse Mother   . Cancer Mother   . Alcohol abuse Father   . Diabetes Father   . Hypertension Father   . Cancer Other      Current medication list and allergy/intolerance information reviewed:   Current Outpatient Prescriptions on File Prior to Visit  Medication Sig Dispense Refill  . amLODipine (NORVASC) 5 MG tablet Take 1 tablet (5 mg total) by mouth daily. 90 tablet 1  . benazepril (LOTENSIN) 20 MG tablet TAKE 1 TABLET (20 MG TOTAL) BY MOUTH DAILY. 90 tablet 1  . cyclobenzaprine (FLEXERIL) 10 MG tablet Take 1 tablet (10 mg total) by mouth 3 (three) times daily as needed for muscle spasms. 30 tablet 0  . Dapagliflozin-Metformin HCl ER 08-999 MG TB24 Take 1 tablet by mouth daily. 90 tablet 1  . glipiZIDE (GLUCOTROL XL) 10 MG 24 hr tablet Take 1 tablet (10 mg total) by mouth daily with breakfast. 90 tablet 1  . HYDROcodone-acetaminophen (NORCO/VICODIN) 5-325 MG tablet Take 1-2 tablets by mouth every 4 (four) hours as needed (mild pain). 60 tablet 0  . linagliptin (TRADJENTA) 5 MG TABS tablet Take 1 tablet (5 mg total) by mouth daily. 30 tablet 11  . loratadine-pseudoephedrine (CLARITIN-D 24 HOUR) 10-240 MG 24 hr tablet Take 1 tablet by mouth daily. (Patient taking differently: Take 1 tablet by mouth daily as needed for allergies. ) 30 tablet 2  . pravastatin (PRAVACHOL) 20 MG tablet Take 1 tablet (20 mg  total) by mouth every evening. 30 tablet 11  . sildenafil (REVATIO) 20 MG tablet TAKE 1 TO 4 TABLETS BY MOUTH DAILY AS NEEDED FOR SEX 50 tablet 4   No current facility-administered medications on file prior to visit.    No Known Allergies    Review of Systems:  Constitutional: No recent illness  HEENT: No  headache, no vision change  Cardiac: No  chest pain, No  pressure, No palpitations  Respiratory:  No  shortness of breath. No  Cough  Gastrointestinal: No  abdominal pain  GU: +Ed  persistent  Musculoskeletal: No new myalgia/arthralgia  Skin: No  Rash, +acne  Neurologic: + arm  weakness, No  Dizziness  Exam:  BP 111/63   Pulse (!) 120   Ht 5\' 5"  (1.651 m)   Wt 199 lb (90.3 kg)   BMI 33.12 kg/m   Constitutional: VS see above. General Appearance: alert, well-developed, well-nourished, NAD  Eyes: Normal lids and conjunctive, non-icteric sclera  Ears, Nose, Mouth, Throat: MMM, Normal external inspection ears/nares/mouth/lips/gums.  Neck: No masses, trachea midline.   Respiratory: Normal respiratory effort. no wheeze, no rhonchi, no rales  Cardiovascular: S1/S2 normal, no murmur, no rub/gallop auscultated. RRR.   Musculoskeletal: Gait normal. Symmetric and independent movement of all extremities  Neurological: Normal balance/coordination. No tremor.  Skin: warm, dry, intact.   Psychiatric: Normal judgment/insight. Normal mood and affect. Oriented x3.      ASSESSMENT/PLAN:   Essential hypertension - Continue current medication regimen.  Acne vulgaris  Class 1 obesity with serious comorbidity in adult, unspecified BMI, unspecified obesity type - Advised limited prescription of phentermine, 2-3 months maximum - Plan: phentermine (ADIPEX-P) 37.5 MG tablet  Erectile dysfunction, unspecified erectile dysfunction type - Referral to urology for persistent ED despite medications - Plan: Ambulatory referral to Urology  Cervical stenosis of spinal canal  Left arm weakness - History of cervical spinal stenosis, status post surgery. Has appointment with surgeon today, advised discuss this with them, may require PT  Abnormal weight gain - Plan: phentermine (ADIPEX-P) 37.5 MG tablet  Hypogonadism male - Okay to schedule nurse visit to restart injections    Patient Instructions  For skin: Stop Cortisone. Look for OTC creams/soaps with Benzoyl peroxide listed in the ingredients.     Visit summary with medication list and pertinent instructions was  printed for patient to review. All questions at time of visit were answered - patient instructed to contact office with any additional concerns. ER/RTC precautions were reviewed with the patient. Follow-up plan: Return in about 6 months (around 04/07/2017) for recheck BP, ANNUAL PHYSICAL if due.

## 2016-10-08 NOTE — Patient Instructions (Signed)
For skin: Stop Cortisone. Look for OTC creams/soaps with Benzoyl peroxide listed in the ingredients.

## 2016-10-26 MED FILL — TRADJENTA 5 MG TABLET: 5 | 30 days supply | Qty: 30 | Fill #4

## 2016-10-26 MED FILL — BENAZEPRIL HCL 20 MG TABLET: 20 | 90 days supply | Qty: 90 | Fill #1

## 2016-10-26 MED FILL — SILDENAFIL 20 MG TABLET: 20 | 30 days supply | Qty: 50 | Fill #1

## 2016-10-29 ENCOUNTER — Ambulatory Visit (INDEPENDENT_AMBULATORY_CARE_PROVIDER_SITE_OTHER): Payer: 59 | Admitting: Osteopathic Medicine

## 2016-10-29 ENCOUNTER — Encounter: Payer: Self-pay | Admitting: Osteopathic Medicine

## 2016-10-29 VITALS — BP 134/80 | HR 109 | Wt 201.0 lb

## 2016-10-29 DIAGNOSIS — T25221A Burn of second degree of right foot, initial encounter: Secondary | ICD-10-CM

## 2016-10-29 DIAGNOSIS — L700 Acne vulgaris: Secondary | ICD-10-CM

## 2016-10-29 MED ORDER — CLINDAMYCIN PHOS-BENZOYL PEROX 1-5 % EX GEL: Freq: Two times a day (BID) | CUTANEOUS | 3 refills | Status: DC

## 2016-10-29 MED FILL — CLINDAMYCIN-BENZOYL PEROX 1: 1-5 | 30 days supply | Qty: 25 | Fill #0

## 2016-10-29 NOTE — Patient Instructions (Signed)
Second-Degree Burn A second-degree burn, also called a partial thickness burn, affects the two outer layers of skin, including:  The outer layer (epidermis).  The layer beneath the epidermis (dermis). A second-degree burn may be minor or major depending on the size of the burn and which parts of the skin are burned. A second-degree burn may take several weeks to heal. CAUSES This condition may be caused by:  Heat (thermal) injury. This happens when skin comes in contact with something very hot, such as a flame or hot liquid. Many second-degree burns are thermal injuries.  Radiation. Sunlight is one type of radiation that can burn the skin. Another type of radiation is used to heat food. Radiation is also used to treat some diseases, such as cancer. All types of radiation can burn the skin.  Electricity. Electrical burns can cause more damage under the skin than on the surface.  Chemicals. Many chemicals, such as acids, can burn the skin. RISK FACTORS This condition is more likely to occur in people who are often exposed to high-risk environments, such as with open flames, chemicals, or electricity. Having cancer that is treated with radiation may also increase your risk. SYMPTOMS Symptoms of this condition include:  Severe pain.  Extreme tenderness.  Deep redness.  Blistered skin.  Skin that has changed color. It may look blotchy, wet, or shiny.  Swelling. DIAGNOSIS This condition is diagnosed with a medical history and physical exam. TREATMENT Treatment for this condition depends on the severity of your burn. It also depends on what caused your burn. Some second-degree burns may need to be treated in a hospital. These include major burns, electrical burns, and chemical burns. Treatment for other second-degree burns includes:  Cooling the burn. This may be done with cool, germ-free (sterile) salt-water.  Medicines, such as:  Pain medicine.  Ointments for the burn. You may  be given an antibiotic ointment to treat or prevent infection, or you can use Neosporin.  Medicines to help with itching: can use benadryl  Covering the burn with a bandage (dressing). If your fingers or toes were burned, each of them may be bandaged separately. Your health care provider may also gently remove any dead skin. Do not try to do this yourself.  HOME CARE INSTRUCTIONS Medicine   Take and apply over-the-counter and prescription medicines only as told by your health care provider.  If you were prescribed antibiotic medicine, take or apply it as told by your health care provider. Do not stop using the antibiotic even if your condition improves. Burn Care   Follow instructions from your health care provider about:  How to take care of your burn: keep clean, do not remove blistered skin on your own, if it drains, that is ok  When and how you should change your dressing: daily dressing change  When you should remove your dressing.  Clean your burn 1-2 times a day or as told by your health care provider.  Wash the wound with mild soap and water.  Rinse the wound with water to remove all soap.  Pat the wound dry with a clean towel. Do not rub it.  Check your burn every day for signs of infection. Check for:  More redness, swelling, or pain.  Warmth.  Pus or a bad smell.  Keep the dressing dry until your health care provider says it can be removed. Do not take baths, swim, use a hot tub, or do anything that would put your burn underwater until  your health care provider approves.  Wear your compression bandages as told by your health care provider, if this applies. General Instructions   Do not put ice on your burn. This can cause further damage. Try cooling the burn with:  Cool water.  A cool, wet cloth (cool compress).  Raise (elevate) the injured area above the level of your heart while you are sitting or lying down.  Do not scratch or pick at the burn.  Do  not break any blisters. Do not peel any skin.  Avoid exposing your burn to the sun.  Drink enough fluid to keep your urine clear or pale yellow.  Rest as told by your health care provider. Do not exercise until your health care provider approves.  Keep all follow-up visits as told by your health care provider. This is important. SEEK MEDICAL CARE IF:  Your symptoms do not improve with treatment.  Your pain is not controlled with medicine.  You have more redness, swelling, or pain around your burn.  Your burn feels warm to the touch.  You have pus or a bad smell coming from the burned area.  You have a fever. SEEK IMMEDIATE MEDICAL CARE IF:  You develop red streaks near the burn.  You develop severe pain. This information is not intended to replace advice given to you by your health care provider. Make sure you discuss any questions you have with your health care provider. Document Released: 04/20/2011 Document Revised: 03/09/2016 Document Reviewed: 05/01/2015 [employer]vier Interactive Patient Education  2017 [employer]

## 2016-10-29 NOTE — Progress Notes (Signed)
HPI: Jordan Taylor is a 50 y.o. male  who presents to [facility] [place] today, 10/29/16,  for chief complaint of:  Chief Complaint  Patient presents with  . Burn    RIGHT FOOT    Burn . Context: hot grease spill . Location: R foot . Quality: blister/burn . Duration: happened last night He sterilized a needle and popped 2 of the larger blisters, draining clear fluid.    Diabetes: A1c 9.8 one month ago 09/02/2016.    Past medical, surgical, social and family history reviewed: Patient Active Problem List   Diagnosis Date Noted  . Cervical stenosis of spinal canal 09/07/2016  . Left arm weakness 06/24/2016  . Hypogonadism in male 02/26/2016  . Erectile dysfunction 07/02/2014  . Obesity 12/04/2013  . Type 2 diabetes mellitus (HCC) 12/04/2013  . Essential hypertension 12/04/2013  . Hyperlipidemia 12/04/2013   Past Surgical History:  Procedure Laterality Date  . ANTERIOR CERVICAL DECOMPRESSION/DISCECTOMY FUSION 4 LEVELS N/A 09/07/2016   Procedure: Anterior Cervical Diskectomy Fusion - Cervical three- Cervical four - Cervical four - Cervical five - Cervical five- Cervical six  - Cervical six - Cervical seven;  Surgeon: Julio SicksHenry Pool, MD;  [facility];  Service: Neurosurgery;  Laterality: N/A;  . KNEE ARTHROSCOPY Left   . spinal cord surgery     20+ yrs ago   Social History  Substance Use Topics  . Smoking status: Former Games developermoker  . Smokeless tobacco: Never Used  . Alcohol use No   Family History  Problem Relation Age of Onset  . Alcohol abuse Mother   . Cancer Mother   . Alcohol abuse Father   . Diabetes Father   . Hypertension Father   . Cancer Other      Current medication list and allergy/intolerance information reviewed:   Current Outpatient Prescriptions on File Prior to Visit  Medication Sig Dispense Refill  . amLODipine (NORVASC) 5 MG tablet Take 1 tablet (5 mg total) by mouth daily. 90 tablet 1  . benazepril (LOTENSIN) 20 MG  tablet TAKE 1 TABLET (20 MG TOTAL) BY MOUTH DAILY. 90 tablet 1  . cyclobenzaprine (FLEXERIL) 10 MG tablet Take 1 tablet (10 mg total) by mouth 3 (three) times daily as needed for muscle spasms. 30 tablet 0  . Dapagliflozin-Metformin HCl ER 08-999 MG TB24 Take 1 tablet by mouth daily. 90 tablet 1  . glipiZIDE (GLUCOTROL XL) 10 MG 24 hr tablet Take 1 tablet (10 mg total) by mouth daily with breakfast. 90 tablet 1  . HYDROcodone-acetaminophen (NORCO/VICODIN) 5-325 MG tablet Take 1-2 tablets by mouth every 4 (four) hours as needed (mild pain). 60 tablet 0  . linagliptin (TRADJENTA) 5 MG TABS tablet Take 1 tablet (5 mg total) by mouth daily. 30 tablet 11  . loratadine-pseudoephedrine (CLARITIN-D 24 HOUR) 10-240 MG 24 hr tablet Take 1 tablet by mouth daily. (Patient taking differently: Take 1 tablet by mouth daily as needed for allergies. ) 30 tablet 2  . phentermine (ADIPEX-P) 37.5 MG tablet Take 1 tablet (37.5 mg total) by mouth daily before breakfast. 30 tablet 0  . pravastatin (PRAVACHOL) 20 MG tablet Take 1 tablet (20 mg total) by mouth every evening. 30 tablet 11  . sildenafil (REVATIO) 20 MG tablet TAKE 1 TO 4 TABLETS BY MOUTH DAILY AS NEEDED FOR SEX 50 tablet 4   No current facility-administered medications on file prior to visit.    No Known Allergies    Review of Systems:  Constitutional: No recent  illness  Musculoskeletal: No new myalgia/arthralgia  Skin: burn as per HPI  Neurologic: No leg/foot weakness  Exam:  BP 134/80   Pulse (!) 109   Wt 201 lb (91.2 kg)   BMI 33.45 kg/m   Constitutional: VS see above. General Appearance: alert, well-developed, well-nourished, NAD  Neck: No masses, trachea midline.   Respiratory: Normal respiratory effort. no wheeze, no rhonchi, no rales  Cardiovascular: S1/S2 normal, no murmur, no rub/gallop auscultated. RRR.   Musculoskeletal: Gait normal. Symmetric and independent movement of all extremities  Neurological: Normal  balance/coordination. No tremor.  Skin: warm, dry, intact. +acne vulgaris face. Right Foot: blistering on medial forefoot and great toe. Small amount of serous drainage expressed on palpation. See photos  Psychiatric: Normal judgment/insight. Normal mood and affect. Oriented x3.           ASSESSMENT/PLAN:   Burn of second degree of right foot, initial encounter - See assesment/plan note  Acne vulgaris - Plan: clindamycin-benzoyl peroxide (BENZACLIN) gel   Wound bandaged by Gena Frayharley Cummings, PA-C w/ Xeroform occlusive dressing and gauze/tape. Instructions reviewed for home care.    Visit summary with medication list and pertinent instructions was printed for patient to review. All questions at time of visit were answered - patient instructed to contact office with any additional concerns. ER/RTC precautions were reviewed with the patient. Follow-up plan: Return in about 4 days (around 11/02/2016) for BURN CHECK-UP, sooner if needed.

## 2016-10-29 NOTE — Assessment & Plan Note (Signed)
Keep skin clean and dry. Change dressings daily. May apply vaseline or neosporin. Do not unroof blisters (peel skin off). Clear fluid drainage is normal. If drainage becomes thick, foul smelling, or puss-like please return.

## 2016-11-02 ENCOUNTER — Ambulatory Visit (INDEPENDENT_AMBULATORY_CARE_PROVIDER_SITE_OTHER): Payer: 59 | Admitting: Osteopathic Medicine

## 2016-11-02 VITALS — BP 117/69 | HR 98 | Wt 194.0 lb

## 2016-11-02 DIAGNOSIS — E669 Obesity, unspecified: Secondary | ICD-10-CM

## 2016-11-02 DIAGNOSIS — T25221A Burn of second degree of right foot, initial encounter: Secondary | ICD-10-CM | POA: Diagnosis not present

## 2016-11-02 DIAGNOSIS — E291 Testicular hypofunction: Secondary | ICD-10-CM

## 2016-11-02 DIAGNOSIS — N529 Male erectile dysfunction, unspecified: Secondary | ICD-10-CM

## 2016-11-02 DIAGNOSIS — R635 Abnormal weight gain: Secondary | ICD-10-CM | POA: Diagnosis not present

## 2016-11-02 MED ORDER — TESTOSTERONE CYPIONATE 200 MG/ML IM SOLN
200.0000 mg | Freq: Once | INTRAMUSCULAR | Status: AC
  Administered 2016-11-02: 200 mg via INTRAMUSCULAR

## 2016-11-02 MED ORDER — PHENTERMINE HCL 37.5 MG PO TABS
37.5000 mg | ORAL_TABLET | Freq: Every day | ORAL | 0 refills | Status: DC
Start: 2016-11-02 — End: 2016-12-02

## 2016-11-02 MED FILL — PHENTERMINE 37.5 MG TABLET: 37.5 | 30 days supply | Qty: 30 | Fill #0

## 2016-11-02 NOTE — Patient Instructions (Addendum)
Four burn: Continue home care/bandaging as you have been doing. I would advise follow-up in the office in 3 days or so for recheck, if you would like to wait longer that is up to you. Please come to the office if things are looking worse. We may need to take off some of the dead skin.   ED: We have placed referral to urologist to discuss erectile function - please let us know if you do not hear back about scheduling an appointment.  Weight: Given refill of phentermine. Reminder: Please contact your insurance company about other long-term treatment for obesity, phentermine is not prescribed by Dr. Lyn HollingsheadAlexander for more than 3 months. Medications approved for long-term use for obesity  Qsymia (Phentermine and Topiramate)  Saxenda (Liraglutide 3 mg/day)  Contrave (Bupropion and Naltrexone)  Lorcaserin (Belviq or Belviq XR)  Orlistat (Xenical, Alli)  Bupropion (Wellbutrin)  For other refills, please ask your pharmacy to forward us an electronic request.

## 2016-11-02 NOTE — Progress Notes (Signed)
HPI: Jordan Taylor is a 50 y.o. male  who presents to [facility] [place] today, 11/02/16,  for chief complaint of:  Chief Complaint  Patient presents with  . Wound Check  . Erectile Dysfunction  . Hypogonadism  . Obesity     Wound check: Patient has been following instructions regarding home bandaging and wound management. Blisters still present, skin appears a bit whitish, there is still some fluid under the blisters, he has not done any more self draining of the blisters.  Erectile dysfunction: Requests referral to urology.  Hypogonadism: Requests testosterone injection be done today while he is here.  Obesity: Taking phentermine. Down from 201 pounds 194 pounds, BMI 32.28. Getting more into exercise since surgery.  Past medical, surgical, social and family history reviewed: Patient Active Problem List   Diagnosis Date Noted  . Burn of second degree of right foot, initial encounter 10/29/2016  . Cervical stenosis of spinal canal 09/07/2016  . Left arm weakness 06/24/2016  . Hypogonadism in male 02/26/2016  . Erectile dysfunction 07/02/2014  . Obesity 12/04/2013  . Type 2 diabetes mellitus (HCC) 12/04/2013  . Essential hypertension 12/04/2013  . Hyperlipidemia 12/04/2013   Past Surgical History:  Procedure Laterality Date  . ANTERIOR CERVICAL DECOMPRESSION/DISCECTOMY FUSION 4 LEVELS N/A 09/07/2016   Procedure: Anterior Cervical Diskectomy Fusion - Cervical three- Cervical four - Cervical four - Cervical five - Cervical five- Cervical six  - Cervical six - Cervical seven;  Surgeon: Julio Sicks, MD;  Location: [facility] OR;  Service: Neurosurgery;  Laterality: N/A;  . KNEE ARTHROSCOPY Left   . spinal cord surgery     20+ yrs ago   Social History  Substance Use Topics  . Smoking status: Former Games developer  . Smokeless tobacco: Never Used  . Alcohol use No   Family History  Problem Relation Age of Onset  . Alcohol abuse Mother   . Cancer Mother   .  Alcohol abuse Father   . Diabetes Father   . Hypertension Father   . Cancer Other      Current medication list and allergy/intolerance information reviewed:   Current Outpatient Prescriptions on File Prior to Visit  Medication Sig Dispense Refill  . amLODipine (NORVASC) 5 MG tablet Take 1 tablet (5 mg total) by mouth daily. 90 tablet 1  . benazepril (LOTENSIN) 20 MG tablet TAKE 1 TABLET (20 MG TOTAL) BY MOUTH DAILY. 90 tablet 1  . clindamycin-benzoyl peroxide (BENZACLIN) gel Apply topically 2 (two) times daily. 35 g 3  . cyclobenzaprine (FLEXERIL) 10 MG tablet Take 1 tablet (10 mg total) by mouth 3 (three) times daily as needed for muscle spasms. 30 tablet 0  . Dapagliflozin-Metformin HCl ER 08-999 MG TB24 Take 1 tablet by mouth daily. 90 tablet 1  . glipiZIDE (GLUCOTROL XL) 10 MG 24 hr tablet Take 1 tablet (10 mg total) by mouth daily with breakfast. 90 tablet 1  . HYDROcodone-acetaminophen (NORCO/VICODIN) 5-325 MG tablet Take 1-2 tablets by mouth every 4 (four) hours as needed (mild pain). 60 tablet 0  . linagliptin (TRADJENTA) 5 MG TABS tablet Take 1 tablet (5 mg total) by mouth daily. 30 tablet 11  . loratadine-pseudoephedrine (CLARITIN-D 24 HOUR) 10-240 MG 24 hr tablet Take 1 tablet by mouth daily. (Patient taking differently: Take 1 tablet by mouth daily as needed for allergies. ) 30 tablet 2  . phentermine (ADIPEX-P) 37.5 MG tablet Take 1 tablet (37.5 mg total) by mouth daily before breakfast. 30 tablet 0  .  pravastatin (PRAVACHOL) 20 MG tablet Take 1 tablet (20 mg total) by mouth every evening. 30 tablet 11  . sildenafil (REVATIO) 20 MG tablet TAKE 1 TO 4 TABLETS BY MOUTH DAILY AS NEEDED FOR SEX 50 tablet 4   No current facility-administered medications on file prior to visit.    No Known Allergies    Review of Systems:  Constitutional: No recent illness  HEENT: No  headache, no vision change  Cardiac: No  chest pain  Respiratory:  No  shortness of  breath  Musculoskeletal: No new myalgia/arthralgia  Skin: burns as per HPI  Exam:  BP 117/69   Pulse 98   Wt 194 lb (88 kg)   BMI 32.28 kg/m   Constitutional: VS see above. General Appearance: alert, well-developed, well-nourished, NAD  Respiratory: Normal respiratory effort.  Musculoskeletal: Gait normal. Symmetric and independent movement of all extremities  Neurological: Normal balance/coordination. No tremor.  Skin: Blistered right foot appears to be healing well. Blisters still have some fluid, overlying skin is pale but no apparent necrosis. Mild redness but no tenderness/edema.   Psychiatric: Normal judgment/insight. Normal mood and affect. Oriented x3.   Last visit: 10/29/16        ASSESSMENT/PLAN:    Patient advised I would like to see him back in the next 3-4 days for wound recheck, may require some debridement of dead skin tissue overlying the blisters, patient states that he would rather come back in one week. Precautions were given to patient, he has capacity to provide informed consent and understands risks of delayed recheck/treatment.  Testosterone shot given today  Confirmed referral for urology  Refilled phentermine, patient advised only one more refill from me, consider contacting insurance company for information about coverage for other anti-obesity medications which are safe for long-term use.  Gena Frayharley Cummings, PA-C, dressed wound prior to patient's discharge. I'm out of the office later this week, if he returns for emergent care, would recommend she check the wound with the supervising provider.  Burn of second degree of right foot, initial encounter  Erectile dysfunction, unspecified erectile dysfunction type - Plan: Ambulatory referral to Urology  Abnormal weight gain - Plan: phentermine (ADIPEX-P) 37.5 MG tablet  Class 1 obesity with serious comorbidity in adult, unspecified BMI, unspecified obesity type - Advised limited prescription of  phentermine, 2-3 months maximum - Plan: phentermine (ADIPEX-P) 37.5 MG tablet  Hypogonadism male - Plan: testosterone cypionate (DEPOTESTOSTERONE CYPIONATE) injection 200 mg    Patient Instructions  Four burn: Continue home care/bandaging as you have been doing. I would advise follow-up in the office in 3 days or so for recheck, if you would like to wait longer that is up to you. Please come to the office if things are looking worse. We may need to take off some of the dead skin.   ED: We have placed referral to urologist to discuss erectile function - please let us know if you do not hear back about scheduling an appointment.  Weight: Given refill of phentermine. Reminder: Please contact your insurance company about other long-term treatment for obesity, phentermine is not prescribed by Dr. Lyn HollingsheadAlexander for more than 3 months. Medications approved for long-term use for obesity  Qsymia (Phentermine and Topiramate)  Saxenda (Liraglutide 3 mg/day)  Contrave (Bupropion and Naltrexone)  Lorcaserin (Belviq or Belviq XR)  Orlistat (Xenical, Alli)  Bupropion (Wellbutrin)  For other refills, please ask your pharmacy to forward us an electronic request.          Visit summary  with medication list and pertinent instructions was printed for patient to review. All questions at time of visit were answered - patient instructed to contact office with any additional concerns. ER/RTC precautions were reviewed with the patient. Follow-up plan: Return in about 1 week (around 11/09/2016) for BURN RECHECK, sooner if needed.

## 2016-11-11 ENCOUNTER — Ambulatory Visit (INDEPENDENT_AMBULATORY_CARE_PROVIDER_SITE_OTHER): Payer: 59 | Admitting: Osteopathic Medicine

## 2016-11-11 ENCOUNTER — Encounter: Payer: Self-pay | Admitting: Osteopathic Medicine

## 2016-11-11 VITALS — BP 154/90 | HR 125 | Wt 199.0 lb

## 2016-11-11 DIAGNOSIS — T25221A Burn of second degree of right foot, initial encounter: Secondary | ICD-10-CM

## 2016-11-11 DIAGNOSIS — Z6833 Body mass index (BMI) 33.0-33.9, adult: Secondary | ICD-10-CM | POA: Diagnosis not present

## 2016-11-11 DIAGNOSIS — M4802 Spinal stenosis, cervical region: Secondary | ICD-10-CM | POA: Diagnosis not present

## 2016-11-12 NOTE — Progress Notes (Signed)
HPI: Jordan Taylor is a 50 y.o. male  who presents to [facility] [place] today, 11/12/16,  for chief complaint of:  Chief Complaint  Patient presents with  . Follow-up    BURN ON RIGHT FOOT     Wound check: Patient has been following instructions regarding home bandaging and wound management. Wife is an Charity fundraiserN and has been helping him with the bandages, she is on the phone with him during the visit, states that she debrided some of the wound few days ago. Patient states that he has been wrapping it may be a bit more tightly to avoid swelling, has been significantly painful in regular shoes.    Past medical, surgical, social and family history reviewed: Patient Active Problem List   Diagnosis Date Noted  . Burn of second degree of right foot, initial encounter 10/29/2016  . Cervical stenosis of spinal canal 09/07/2016  . Left arm weakness 06/24/2016  . Hypogonadism in male 02/26/2016  . Erectile dysfunction 07/02/2014  . Obesity 12/04/2013  . Type 2 diabetes mellitus (HCC) 12/04/2013  . Essential hypertension 12/04/2013  . Hyperlipidemia 12/04/2013   Past Surgical History:  Procedure Laterality Date  . ANTERIOR CERVICAL DECOMPRESSION/DISCECTOMY FUSION 4 LEVELS N/A 09/07/2016   Procedure: Anterior Cervical Diskectomy Fusion - Cervical three- Cervical four - Cervical four - Cervical five - Cervical five- Cervical six  - Cervical six - Cervical seven;  Surgeon: Julio SicksHenry Pool, MD;  [facility];  Service: Neurosurgery;  Laterality: N/A;  . KNEE ARTHROSCOPY Left   . spinal cord surgery     20+ yrs ago   Social History  Substance Use Topics  . Smoking status: Former Games developermoker  . Smokeless tobacco: Never Used  . Alcohol use No   Family History  Problem Relation Age of Onset  . Alcohol abuse Mother   . Cancer Mother   . Alcohol abuse Father   . Diabetes Father   . Hypertension Father   . Cancer Other      Current medication list and  allergy/intolerance information reviewed:   Current Outpatient Prescriptions on File Prior to Visit  Medication Sig Dispense Refill  . amLODipine (NORVASC) 5 MG tablet Take 1 tablet (5 mg total) by mouth daily. 90 tablet 1  . benazepril (LOTENSIN) 20 MG tablet TAKE 1 TABLET (20 MG TOTAL) BY MOUTH DAILY. 90 tablet 1  . clindamycin-benzoyl peroxide (BENZACLIN) gel Apply topically 2 (two) times daily. 35 g 3  . cyclobenzaprine (FLEXERIL) 10 MG tablet Take 1 tablet (10 mg total) by mouth 3 (three) times daily as needed for muscle spasms. 30 tablet 0  . Dapagliflozin-Metformin HCl ER 08-999 MG TB24 Take 1 tablet by mouth daily. 90 tablet 1  . glipiZIDE (GLUCOTROL XL) 10 MG 24 hr tablet Take 1 tablet (10 mg total) by mouth daily with breakfast. 90 tablet 1  . HYDROcodone-acetaminophen (NORCO/VICODIN) 5-325 MG tablet Take 1-2 tablets by mouth every 4 (four) hours as needed (mild pain). 60 tablet 0  . linagliptin (TRADJENTA) 5 MG TABS tablet Take 1 tablet (5 mg total) by mouth daily. 30 tablet 11  . loratadine-pseudoephedrine (CLARITIN-D 24 HOUR) 10-240 MG 24 hr tablet Take 1 tablet by mouth daily. (Patient taking differently: Take 1 tablet by mouth daily as needed for allergies. ) 30 tablet 2  . phentermine (ADIPEX-P) 37.5 MG tablet Take 1 tablet (37.5 mg total) by mouth daily before breakfast. 30 tablet 0  . pravastatin (PRAVACHOL) 20 MG tablet Take 1 tablet (20  mg total) by mouth every evening. 30 tablet 11  . sildenafil (REVATIO) 20 MG tablet TAKE 1 TO 4 TABLETS BY MOUTH DAILY AS NEEDED FOR SEX 50 tablet 4   No current facility-administered medications on file prior to visit.    No Known Allergies    Review of Systems:  Constitutional: No recent illness  HEENT: No  headache, no vision change  Cardiac: No  chest pain  Respiratory:  No  shortness of breath  Musculoskeletal: No new myalgia/arthralgia  Skin: burns as per HPI  Exam:  BP (!) 154/90   Pulse (!) 125   Wt 199 lb (90.3 kg)    BMI 33.12 kg/m   Constitutional: VS see above. General Appearance: alert, well-developed, well-nourished, NAD  Respiratory: Normal respiratory effort.  Musculoskeletal: Gait normal. Symmetric and independent movement of all extremities  Neurological: Normal balance/coordination. No tremor.  Skin: Open second-degree burns on right foot, white granulation tissue at edges, no erythema/. No drainage. Some additional debridement at great toe was performed to remove some dead skin, healthy granulation tissue underneath. No significant lower extremity edema.  Psychiatric: Normal judgment/insight. Normal mood and affect. Oriented x3.        ASSESSMENT/PLAN:    Wound was re-bandaged, patient was given postop shoe, hopefully this will be more comfortable then regular tennis shoes. Patient has been performing good wound care at home with help with his wife. Would still like to keep an eye on this given history of diabetes. No apparent infection at this point, RTC precautions with particular regard to infection were reviewed with the patient.  Burn of second degree of right foot, initial encounter      Visit summary with medication list and pertinent instructions was printed for patient to review. All questions at time of visit were answered - patient instructed to contact office with any additional concerns. ER/RTC precautions were reviewed with the patient. Follow-up plan: Return in about 1 week (around 11/18/2016) for wound recheck.

## 2016-11-19 ENCOUNTER — Ambulatory Visit: Payer: 59 | Admitting: Osteopathic Medicine

## 2016-11-26 ENCOUNTER — Ambulatory Visit: Payer: 59 | Admitting: Osteopathic Medicine

## 2016-11-27 ENCOUNTER — Other Ambulatory Visit: Payer: Self-pay | Admitting: Family Medicine

## 2016-11-27 MED FILL — TRADJENTA 5 MG TABLET: 5 | 30 days supply | Qty: 30 | Fill #5

## 2016-12-02 ENCOUNTER — Ambulatory Visit (INDEPENDENT_AMBULATORY_CARE_PROVIDER_SITE_OTHER): Payer: 59 | Admitting: Osteopathic Medicine

## 2016-12-02 ENCOUNTER — Encounter: Payer: Self-pay | Admitting: Osteopathic Medicine

## 2016-12-02 VITALS — BP 140/77 | HR 104 | Wt 197.0 lb

## 2016-12-02 DIAGNOSIS — I10 Essential (primary) hypertension: Secondary | ICD-10-CM | POA: Diagnosis not present

## 2016-12-02 DIAGNOSIS — E669 Obesity, unspecified: Secondary | ICD-10-CM | POA: Diagnosis not present

## 2016-12-02 DIAGNOSIS — T25221A Burn of second degree of right foot, initial encounter: Secondary | ICD-10-CM | POA: Diagnosis not present

## 2016-12-02 DIAGNOSIS — E291 Testicular hypofunction: Secondary | ICD-10-CM | POA: Diagnosis not present

## 2016-12-02 DIAGNOSIS — N529 Male erectile dysfunction, unspecified: Secondary | ICD-10-CM

## 2016-12-02 MED ORDER — AMLODIPINE BESYLATE 5 MG PO TABS: 5.0000 mg | ORAL_TABLET | Freq: Every day | ORAL | 1 refills | Status: DC

## 2016-12-02 MED ORDER — SILDENAFIL CITRATE 20 MG PO TABS: ORAL_TABLET | ORAL | 4 refills | Status: DC

## 2016-12-02 MED ORDER — PHENTERMINE HCL 37.5 MG PO TABS: 37.5000 mg | ORAL_TABLET | Freq: Every day | ORAL | 0 refills | Status: DC

## 2016-12-02 MED ORDER — TESTOSTERONE CYPIONATE 200 MG/ML IM SOLN
200.0000 mg | INTRAMUSCULAR | Status: DC
  Administered 2016-12-02: 200 mg via INTRAMUSCULAR

## 2016-12-02 MED FILL — AMLODIPINE BESYLATE 5 MG TA: 5 | 90 days supply | Qty: 90 | Fill #0

## 2016-12-02 MED FILL — SILDENAFIL 20 MG TABLET: 20 | 15 days supply | Qty: 60 | Fill #0

## 2016-12-02 MED FILL — PHENTERMINE 37.5 MG TABLET: 37.5 | 30 days supply | Qty: 30 | Fill #0

## 2016-12-02 NOTE — Progress Notes (Signed)
HPI: Jordan Taylor is a 51 y.o. male  who presents to [facility] [place] today, 12/02/16,  for chief complaint of:  Chief Complaint  Patient presents with  . Follow-up    BURN ON RIGHT FOOT     Wound check: Patient has been following instructions regarding home bandaging and wound management. Wife is an Charity fundraiser and has been helping him with the bandages. He has eased off on very tight bandages. He has been keeping the foot and postsurgical shoe for greater freedom around the toes. There is still one area on the great toe that is causing him pain but otherwise everything appears to be healing better, patient is pleased with the progress  Requests refills on medications for phentermine/weight loss, blood pressure, testosterone injection today  Testosterone deficiency: Has been missing injections, slightly overdue for labs  Past medical, surgical, social and family history reviewed: Patient Active Problem List   Diagnosis Date Noted  . Burn of second degree of right foot, initial encounter 10/29/2016  . Cervical stenosis of spinal canal 09/07/2016  . Left arm weakness 06/24/2016  . Hypogonadism in male 02/26/2016  . Erectile dysfunction 07/02/2014  . Obesity 12/04/2013  . Type 2 diabetes mellitus (HCC) 12/04/2013  . Essential hypertension 12/04/2013  . Hyperlipidemia 12/04/2013   Past Surgical History:  Procedure Laterality Date  . ANTERIOR CERVICAL DECOMPRESSION/DISCECTOMY FUSION 4 LEVELS N/A 09/07/2016   Procedure: Anterior Cervical Diskectomy Fusion - Cervical three- Cervical four - Cervical four - Cervical five - Cervical five- Cervical six  - Cervical six - Cervical seven;  Surgeon: Julio Sicks, MD;  Location: [facility] OR;  Service: Neurosurgery;  Laterality: N/A;  . KNEE ARTHROSCOPY Left   . spinal cord surgery     20+ yrs ago   Social History  Substance Use Topics  . Smoking status: Former Games developer  . Smokeless tobacco: Never Used  . Alcohol use No    Family History  Problem Relation Age of Onset  . Alcohol abuse Mother   . Cancer Mother   . Alcohol abuse Father   . Diabetes Father   . Hypertension Father   . Cancer Other      Current medication list and allergy/intolerance information reviewed:   Current Outpatient Prescriptions on File Prior to Visit  Medication Sig Dispense Refill  . amLODipine (NORVASC) 5 MG tablet Take 1 tablet (5 mg total) by mouth daily. 90 tablet 1  . benazepril (LOTENSIN) 20 MG tablet TAKE 1 TABLET (20 MG TOTAL) BY MOUTH DAILY. 90 tablet 1  . clindamycin-benzoyl peroxide (BENZACLIN) gel Apply topically 2 (two) times daily. 35 g 3  . cyclobenzaprine (FLEXERIL) 10 MG tablet Take 1 tablet (10 mg total) by mouth 3 (three) times daily as needed for muscle spasms. 30 tablet 0  . Dapagliflozin-Metformin HCl ER 08-999 MG TB24 Take 1 tablet by mouth daily. 90 tablet 1  . glipiZIDE (GLUCOTROL XL) 10 MG 24 hr tablet Take 1 tablet (10 mg total) by mouth daily with breakfast. 90 tablet 1  . HYDROcodone-acetaminophen (NORCO/VICODIN) 5-325 MG tablet Take 1-2 tablets by mouth every 4 (four) hours as needed (mild pain). 60 tablet 0  . linagliptin (TRADJENTA) 5 MG TABS tablet Take 1 tablet (5 mg total) by mouth daily. 30 tablet 11  . loratadine-pseudoephedrine (CLARITIN-D 24 HOUR) 10-240 MG 24 hr tablet Take 1 tablet by mouth daily. (Patient taking differently: Take 1 tablet by mouth daily as needed for allergies. ) 30 tablet 2  . phentermine (ADIPEX-P)  37.5 MG tablet Take 1 tablet (37.5 mg total) by mouth daily before breakfast. 30 tablet 0  . pravastatin (PRAVACHOL) 20 MG tablet Take 1 tablet (20 mg total) by mouth every evening. 30 tablet 11  . sildenafil (REVATIO) 20 MG tablet TAKE 1 TO 4 TABLETS BY MOUTH DAILY AS NEEDED FOR SEX 50 tablet 4   No current facility-administered medications on file prior to visit.    No Known Allergies    Review of Systems:  Constitutional: No recent illness  HEENT: No  headache, no  vision change  Cardiac: No  chest pain  Respiratory:  No  shortness of breath  Musculoskeletal: No new myalgia/arthralgia  Skin: burns as per HPI  Exam:  BP 140/77   Pulse (!) 104   Wt 197 lb (89.4 kg)   BMI 32.78 kg/m   Constitutional: VS see above. General Appearance: alert, well-developed, well-nourished, NAD  Respiratory: Normal respiratory effort.  Musculoskeletal: Gait normal. Symmetric and independent movement of all extremities  Neurological: Normal balance/coordination. No tremor.  Skin: Open second-degree burns on right foot, white granulation tissue at edges Has resolved, pink skin tissue for the most part is present, no erythema/. No drainage.medial right great toe some rough/nonhealing tissue, no ulceration or drainage. No significant lower extremity edema.  Psychiatric: Normal judgment/insight. Normal mood and affect. Oriented x3.        ASSESSMENT/PLAN:    Wound was re-bandaged, patient was given instructions to continue postop shoe,. Patient has been performing good wound care at home with help with his wife. Good granulation tissue and overall well-healing. No apparent infection at this point, RTC precautions with particular regard to infection were reviewed with the patient. Advised wound is continuing to heal, I don't think he necessarily requires further workup/monitoring in the office, I don't think any department is necessary at this point.  Burn of second degree of right foot, initial encounter  Male hypogonadism - Needs to be doing testosterone injections in the office every 2 weeks, will be due for labs to recheck testosterone and CBC in 2 months between injections - Plan: testosterone cypionate (DEPOTESTOSTERONE CYPIONATE) injection 200 mg  Essential hypertension - Plan: amLODipine (NORVASC) 5 MG tablet  Class 1 obesity with serious comorbidity in adult, unspecified BMI, unspecified obesity type - Advised limited prescription of phentermine, 2-3  months maximum - Plan: phentermine (ADIPEX-P) 37.5 MG tablet  Erectile dysfunction, unspecified erectile dysfunction type - Plan: sildenafil (REVATIO) 20 MG tablet      Visit summary with medication list and pertinent instructions was printed for patient to review. All questions at time of visit were answered - patient instructed to contact office with any additional concerns. ER/RTC precautions were reviewed with the patient. Follow-up plan: Return in about 2 months (around 01/30/2017) for labs to monitor testosterone .

## 2016-12-08 DIAGNOSIS — E291 Testicular hypofunction: Secondary | ICD-10-CM | POA: Diagnosis not present

## 2016-12-08 DIAGNOSIS — Z760 Encounter for issue of repeat prescription: Secondary | ICD-10-CM | POA: Diagnosis not present

## 2016-12-08 DIAGNOSIS — Z125 Encounter for screening for malignant neoplasm of prostate: Secondary | ICD-10-CM | POA: Diagnosis not present

## 2016-12-08 DIAGNOSIS — N5201 Erectile dysfunction due to arterial insufficiency: Secondary | ICD-10-CM | POA: Diagnosis not present

## 2016-12-10 ENCOUNTER — Ambulatory Visit: Payer: 59 | Attending: Neurology | Admitting: Occupational Therapy

## 2016-12-14 DIAGNOSIS — N5201 Erectile dysfunction due to arterial insufficiency: Secondary | ICD-10-CM | POA: Diagnosis not present

## 2016-12-14 DIAGNOSIS — E291 Testicular hypofunction: Secondary | ICD-10-CM | POA: Diagnosis not present

## 2016-12-21 DIAGNOSIS — Z760 Encounter for issue of repeat prescription: Secondary | ICD-10-CM | POA: Diagnosis not present

## 2016-12-25 MED FILL — CLINDAMYCIN-BENZOYL PEROX 1: 1-5 | 30 days supply | Qty: 25 | Fill #1

## 2016-12-25 MED FILL — ANASTROZOLE 1 MG TABLET: 1 | 84 days supply | Qty: 36 | Fill #0

## 2016-12-29 MED FILL — XIGDUO XR 10 MG-1,000 MG TA: 10-1000 | 90 days supply | Qty: 90 | Fill #1

## 2016-12-31 ENCOUNTER — Ambulatory Visit (INDEPENDENT_AMBULATORY_CARE_PROVIDER_SITE_OTHER): Payer: 59 | Admitting: Osteopathic Medicine

## 2016-12-31 VITALS — BP 104/74 | HR 105 | Wt 199.1 lb

## 2016-12-31 DIAGNOSIS — E291 Testicular hypofunction: Secondary | ICD-10-CM

## 2016-12-31 MED ORDER — TESTOSTERONE CYPIONATE 200 MG/ML IM SOLN
200.0000 mg | INTRAMUSCULAR | Status: DC
  Administered 2016-12-31: 200 mg via INTRAMUSCULAR

## 2016-12-31 NOTE — Progress Notes (Signed)
   Subjective:    Patient ID: Jordan Taylor, male    DOB: [birth year], 51 y.o.   MRN: [id number]  HPI Pt is here for a testosterone injection. Denies chest pain,shortness of breath, mood changes, or problem with medication.   Review of Systems     Objective:   Physical Exam        Assessment & Plan:  Pt tolerated injection well without complications. Pt advised to schedule next injection in 28 days.

## 2017-01-11 MED FILL — TRADJENTA 5 MG TABLET: 5 | 30 days supply | Qty: 30 | Fill #6

## 2017-01-12 MED FILL — SILDENAFIL 20 MG TABLET: 20 | 15 days supply | Qty: 60 | Fill #1

## 2017-01-14 ENCOUNTER — Ambulatory Visit: Payer: 59

## 2017-01-18 ENCOUNTER — Ambulatory Visit (INDEPENDENT_AMBULATORY_CARE_PROVIDER_SITE_OTHER): Payer: 59 | Admitting: Osteopathic Medicine

## 2017-01-18 DIAGNOSIS — E291 Testicular hypofunction: Secondary | ICD-10-CM | POA: Diagnosis not present

## 2017-01-18 MED ORDER — TESTOSTERONE CYPIONATE 200 MG/ML IM SOLN
200.0000 mg | Freq: Once | INTRAMUSCULAR | Status: AC
  Administered 2017-01-18: 200 mg via INTRAMUSCULAR

## 2017-01-18 NOTE — Progress Notes (Signed)
Please call patient: Testosterone levels checked >6 mos ago, due for 6 month recheck of T levels and blood count. Needs to get these labs drawn fasting, as close to 8:00 AM as possible, mid-way between his T injections - can just go to the lab.

## 2017-01-18 NOTE — Progress Notes (Signed)
Pt informed. Pt expressed understanding and is agreeable. Clayborn Milnes CMA, RT 

## 2017-01-18 NOTE — Progress Notes (Signed)
   Subjective:    Patient ID: Jordan Taylor, male    DOB: 09-23-1966, 51 y.o.   MRN: [id number]  HPI Pt is here for a testosterone Injection. Denies chest pain, shortness of breath, headaches, mood changes, or medication problems.   Review of Systems     Objective:   Physical Exam        Assessment & Plan:  Pt tolerated injection in LUOQ well without complications. Pt advised to schedule a nurse follow-up  visit in 14 days.

## 2017-01-25 ENCOUNTER — Encounter: Payer: Self-pay | Admitting: Family Medicine

## 2017-01-25 ENCOUNTER — Ambulatory Visit (INDEPENDENT_AMBULATORY_CARE_PROVIDER_SITE_OTHER): Payer: 59 | Admitting: Family Medicine

## 2017-01-25 VITALS — BP 135/77 | HR 102 | Temp 98.4°F | Wt 195.0 lb

## 2017-01-25 DIAGNOSIS — N342 Other urethritis: Secondary | ICD-10-CM | POA: Diagnosis not present

## 2017-01-25 MED ORDER — AZITHROMYCIN 250 MG PO TABS: 1000.0000 mg | ORAL_TABLET | Freq: Once | ORAL | 0 refills | Status: AC

## 2017-01-25 MED FILL — AZITHROMYCIN 250 MG TABLET: 250 | 1 days supply | Qty: 4 | Fill #0

## 2017-01-25 NOTE — Progress Notes (Signed)
Jordan Taylor is a 51 y.o. male who [name] today for penile discharge and burning. Symptoms started 2 days ago after vacation. He had unprotected sex with his wife. He denies fevers chills nausea vomiting or diarrhea. Feels well otherwise.   Past Medical History:  Diagnosis Date  . Essential hypertension 12/04/2013  . Type 2 diabetes mellitus (HCC) 12/04/2013   Past Surgical History:  Procedure Laterality Date  . ANTERIOR CERVICAL DECOMPRESSION/DISCECTOMY FUSION 4 LEVELS N/A 09/07/2016   Procedure: Anterior Cervical Diskectomy Fusion - Cervical three- Cervical four - Cervical four - Cervical five - Cervical five- Cervical six  - Cervical six - Cervical seven;  Surgeon: Julio SicksHenry Pool, MD;  Location[facility] OR;  Service: Neurosurgery;  Laterality: N/A;  . KNEE ARTHROSCOPY Left   . spinal cord surgery     20+ yrs ago   Social History  Substance Use Topics  . Smoking status: Former Games developermoker  . Smokeless tobacco: Never Used  . Alcohol use No   family history includes Alcohol abuse in his father and mother; Cancer in his mother and other; Diabetes in his father; Hypertension in his father.  ROS as above:  Medications: Current Outpatient Prescriptions  Medication Sig Dispense Refill  . amLODipine (NORVASC) 5 MG tablet Take 1 tablet (5 mg total) by mouth daily. 90 tablet 1  . azithromycin (ZITHROMAX) 250 MG tablet Take 4 tablets (1,000 mg total) by mouth once. 4 tablet 0  . benazepril (LOTENSIN) 20 MG tablet TAKE 1 TABLET (20 MG TOTAL) BY MOUTH DAILY. 90 tablet 1  . clindamycin-benzoyl peroxide (BENZACLIN) gel Apply topically 2 (two) times daily. 35 g 3  . cyclobenzaprine (FLEXERIL) 10 MG tablet Take 1 tablet (10 mg total) by mouth 3 (three) times daily as needed for muscle spasms. 30 tablet 0  . Dapagliflozin-Metformin HCl ER 08-999 MG TB24 Take 1 tablet by  mouth daily. 90 tablet 1  . glipiZIDE (GLUCOTROL XL) 10 MG 24 hr tablet Take 1 tablet (10 mg total) by mouth daily with breakfast. 90 tablet 1  . linagliptin (TRADJENTA) 5 MG TABS tablet Take 1 tablet (5 mg total) by mouth daily. 30 tablet 11  . loratadine-pseudoephedrine (CLARITIN-D 24 HOUR) 10-240 MG 24 hr tablet Take 1 tablet by mouth daily. 30 tablet 2  . pravastatin (PRAVACHOL) 20 MG tablet Take 1 tablet (20 mg total) by mouth every evening. 30 tablet 11  . sildenafil (REVATIO) 20 MG tablet TAKE 1 TO 4 TABLETS BY MOUTH DAILY AS NEEDED FOR SEX 50 tablet 4   Current Facility-Administered Medications  Medication Dose Route Frequency Provider Last Rate Last Dose  . testosterone cypionate (DEPOTESTOSTERONE CYPIONATE) injection 200 mg  200 mg Intramuscular Q28 days Sunnie NielsenNatalie Alexander, DO   200 mg at 12/02/16 1122  . testosterone cypionate (DEPOTESTOSTERONE CYPIONATE) injection 200 mg  200 mg Intramuscular Q28 days Sunnie NielsenNatalie Alexander, DO   200 mg at 12/31/16 1053   No Known Allergies  Health Maintenance Health Maintenance  Topic Date Due  . Hepatitis C Screening  01/13/2018 (Originally 1966-07-27)  . HIV Screening  01/14/2018 (Originally 04/23/1979)  . OPHTHALMOLOGY EXAM  01/21/2018 (Originally 03/07/2016)  . HEMOGLOBIN A1C  03/03/2017  . FOOT EXAM  01/18/2018  . Fecal DNA (Cologuard)  06/06/2019  . PNEUMOCOCCAL POLYSACCHARIDE VACCINE (2) 09/24/2021  . TETANUS/TDAP  12/10/2024  . INFLUENZA VACCINE  Completed     Exam:  BP 135/77   Pulse (!) 102  Temp 98.4 F (36.9 C) (Oral)   Wt 195 lb (88.5 kg)   BMI 32.45 kg/m  Gen: Well NAD HEENT: EOMI,  MMM Lungs: Normal work of breathing. CTABL Heart: RRR no MRG Abd: NABS, Soft. Nondistended, Nontender Exts: Brisk capillary refill, warm and well perfused.  Penis is circumcised with no lesions. Whitish to yellowish discharge is present. Nontender. No inguinal lymphadenopathy. Testicles are small and descended bilaterally and nontender.   No  results found for this or any previous visit (from the past 72 hour(s)). No results found.    Assessment and Plan: 51 y.o. male with urethritis. Unclear etiology. GC chlamydia urine culture HIV and RPR pending. Empiric treatment with 250 mg of ceftriaxone im and 1000 mg of azithromycin today. Recheck as needed.    Orders Placed This Encounter  Procedures  . GC/Chlamydia Probe Amp  . Urine culture  . HIV antibody  . RPR   Meds ordered this encounter  Medications  . azithromycin (ZITHROMAX) 250 MG tablet    Sig: Take 4 tablets (1,000 mg total) by mouth once.    Dispense:  4 tablet    Refill:  0     Discussed warning signs or symptoms. Please see discharge instructions. Patient expresses understanding.

## 2017-01-25 NOTE — Patient Instructions (Signed)
Thank you for coming in today. The shot today and the antibiotic pills should take care of most infections.  We will get the results back to you ASAP.  Let me know if you do not feel better.  If your belly pain worsens, or you have high fever, bad vomiting, blood in your stool or black tarry stool go to the Emergency Room.    Urethritis, Adult Urethritis is an inflammation of the tube through which urine exits your bladder (urethra). What are the causes? Urethritis is often caused by an infection in your urethra. The infection can be viral, like herpes. The infection can also be bacterial, like gonorrhea. What increases the risk? Risk factors of urethritis include:  Having sex without using a condom.  Having multiple sexual partners.  Having poor hygiene. What are the signs or symptoms? Symptoms of urethritis are less noticeable in women than in men. These symptoms include:  Burning feeling when you urinate (dysuria).  Discharge from your urethra.  Blood in your urine (hematuria).  Urinating more than usual. How is this diagnosed? To confirm a diagnosis of urethritis, your health care provider will do the following:  Ask about your sexual history.  Perform a physical exam.  Have you provide a sample of your urine for lab testing.  Use a cotton swab to gently collect a sample from your urethra for lab testing. How is this treated? It is important to treat urethritis. Depending on the cause, untreated urethritis may lead to serious genital infections and possibly infertility. Urethritis caused by a bacterial infection is treated with antibiotic medicine. All sexual partners must be treated. Follow these instructions at home:  Do not have sex until the test results are known and treatment is completed, even if your symptoms go away before you finish treatment.  If you were prescribed an antibiotic, finish it all even if you start to feel better. Contact a health care  provider if:  Your symptoms are not improved in 3 days.  Your symptoms are getting worse.  You develop abdominal pain or pelvic pain (in women).  You develop joint pain.  You have a fever. Get help right away if:  You have severe pain in the belly, back, or side.  You have repeated vomiting. This information is not intended to replace advice given to you by your health care provider. Make sure you discuss any questions you have with your health care provider. Document Released: 05/12/2001 Document Revised: 04/23/2016 Document Reviewed: 07/17/2013 Elsevier Interactive Patient Education  2017 [employer]

## 2017-01-26 DIAGNOSIS — N342 Other urethritis: Secondary | ICD-10-CM | POA: Diagnosis not present

## 2017-01-26 MED ORDER — CEFTRIAXONE SODIUM 250 MG IJ SOLR
250.0000 mg | Freq: Once | INTRAMUSCULAR | Status: AC
  Administered 2017-01-26: 250 mg via INTRAMUSCULAR

## 2017-01-26 NOTE — Addendum Note (Signed)
Addended by: Minna AntisBRIGHAM, Rupal Childress T on: 01/26/2017 03:53 PM   Modules accepted: Orders

## 2017-01-27 ENCOUNTER — Telehealth: Payer: Self-pay

## 2017-01-27 LAB — URINE CULTURE: Organism ID, Bacteria: NO GROWTH

## 2017-01-27 NOTE — Telephone Encounter (Signed)
Pt left a detailed vm stating that he is still experiencing burning in his genital area despite abx injection. He is concerned and would like to know what to do. Please advise.

## 2017-01-28 ENCOUNTER — Telehealth: Payer: Self-pay | Admitting: Osteopathic Medicine

## 2017-01-28 ENCOUNTER — Ambulatory Visit (INDEPENDENT_AMBULATORY_CARE_PROVIDER_SITE_OTHER): Payer: 59 | Admitting: Osteopathic Medicine

## 2017-01-28 VITALS — BP 127/70 | HR 92 | Ht 65.5 in | Wt 198.1 lb

## 2017-01-28 DIAGNOSIS — E291 Testicular hypofunction: Secondary | ICD-10-CM | POA: Diagnosis not present

## 2017-01-28 MED ORDER — TESTOSTERONE CYPIONATE 200 MG/ML IM SOLN
200.0000 mg | Freq: Once | INTRAMUSCULAR | Status: AC
  Administered 2017-01-28: 200 mg via INTRAMUSCULAR

## 2017-01-28 MED ORDER — DOXYCYCLINE HYCLATE 100 MG PO TABS: 100.0000 mg | ORAL_TABLET | Freq: Two times a day (BID) | ORAL | 0 refills | Status: DC

## 2017-01-28 MED FILL — DOXYCYCLINE HYC 100 MG CAP: 100 | 7 days supply | Qty: 14 | Fill #0

## 2017-01-28 NOTE — Telephone Encounter (Signed)
Information discussed with pt. Pt verbalized understanding. 

## 2017-01-28 NOTE — Progress Notes (Signed)
Pt was informed. Pt stated he is coming in for labs next week that way it gets done between his injections.

## 2017-01-28 NOTE — Telephone Encounter (Signed)
Will route to Dr Denyse Amassorey who saw him for this issue

## 2017-01-28 NOTE — Telephone Encounter (Signed)
We will try switching antibiotics.  If this does not help return to clinic.  Please start taking doxycycline daily.  Return as needed

## 2017-01-28 NOTE — Progress Notes (Signed)
Last testosterone level and blood count on file for him was from July. He is due for these labs. The orders are in, he needs to have the blood drawn midway between testosterone injections, can we call him and let him know to come downstairs to the lab next week for a testosterone level and blood count check, thanks.

## 2017-01-28 NOTE — Progress Notes (Signed)
   Subjective:    Patient ID: Jordan Taylor, male    DOB: [birth year], 51 y.o.   MRN: [id number]  HPI Pt was here today for a testosterone Injection. Denies chest pain, shortness of breath, papitations, mood changes, or any medication problems.   Review of Systems  Respiratory: Negative for shortness of breath.   Cardiovascular: Negative for chest pain and palpitations.       Objective:   Physical Exam        Assessment & Plan:  Pt tolerated injection in RUOQ well without complications. Pt advised to to schedule next injection in 14 days.

## 2017-01-28 NOTE — Telephone Encounter (Signed)
Received information from [facility], unable to run GC/Chlamydia due to incorrect specimen tube sent. Will route to PCP.

## 2017-01-29 NOTE — Telephone Encounter (Signed)
We had a lab error and were unable to run the gonorrhea and chlamydia test. If not improving please return to clinic for repeat testing. It's possible that we successfully treated gonorrhea or chlamydia but did not recognize it because the lab was not done correctly. This puts your wife at risk and she should be independently tested as well. We could try retesting now but after 2 rounds of antibiotics a negative gonorrhea and chlamydia test does not mean that you never had it.

## 2017-01-29 NOTE — Telephone Encounter (Signed)
Left vm requesting a callback from pt.

## 2017-01-29 NOTE — Telephone Encounter (Signed)
Left nvm Requested a call back with concerns.

## 2017-02-03 ENCOUNTER — Ambulatory Visit: Payer: 59 | Admitting: Osteopathic Medicine

## 2017-02-09 ENCOUNTER — Other Ambulatory Visit: Payer: Self-pay | Admitting: Osteopathic Medicine

## 2017-02-09 DIAGNOSIS — N342 Other urethritis: Secondary | ICD-10-CM | POA: Diagnosis not present

## 2017-02-09 DIAGNOSIS — E291 Testicular hypofunction: Secondary | ICD-10-CM | POA: Diagnosis not present

## 2017-02-09 LAB — GC/CHLAMYDIA PROBE AMP

## 2017-02-10 LAB — RPR

## 2017-02-10 LAB — CBC
HCT: 43.3 % (ref 38.5–50.0)
Hemoglobin: 14.4 g/dL (ref 13.2–17.1)
MCH: 28.7 pg (ref 27.0–33.0)
MCHC: 33.3 g/dL (ref 32.0–36.0)
MCV: 86.3 fL (ref 80.0–100.0)
MPV: 9.5 fL (ref 7.5–12.5)
Platelets: 328 10*3/uL (ref 140–400)
RBC: 5.02 MIL/uL (ref 4.20–5.80)
RDW: 14.6 % (ref 11.0–15.0)
WBC: 10.1 10*3/uL (ref 3.8–10.8)

## 2017-02-10 LAB — TESTOSTERONE TOTAL,FREE,BIO, MALES
Albumin: 3.8 g/dL (ref 3.6–5.1)
Sex Hormone Binding: 24 nmol/L (ref 10–50)
Testosterone, Bioavailable: 79.6 ng/dL — ABNORMAL LOW (ref 110.0–575.0)
Testosterone, Free: 45.4 pg/mL — ABNORMAL LOW (ref 46.0–224.0)
Testosterone: 268 ng/dL (ref 250–827)

## 2017-02-10 LAB — HIV ANTIBODY (ROUTINE TESTING W REFLEX): HIV 1&2 Ab, 4th Generation: NONREACTIVE

## 2017-02-11 ENCOUNTER — Ambulatory Visit (INDEPENDENT_AMBULATORY_CARE_PROVIDER_SITE_OTHER): Payer: 59 | Admitting: Osteopathic Medicine

## 2017-02-11 VITALS — BP 132/79 | HR 105

## 2017-02-11 DIAGNOSIS — E291 Testicular hypofunction: Secondary | ICD-10-CM | POA: Diagnosis not present

## 2017-02-11 MED ORDER — TESTOSTERONE CYPIONATE 200 MG/ML IM SOLN
200.0000 mg | Freq: Once | INTRAMUSCULAR | Status: AC
  Administered 2017-02-11: 200 mg via INTRAMUSCULAR

## 2017-02-11 NOTE — Progress Notes (Signed)
Patient was seen in office for testosterone injection. Gave 1 mL of testosterone in LUOQ. Patient tolerated well with no complications.

## 2017-02-15 ENCOUNTER — Encounter: Payer: Self-pay | Admitting: Osteopathic Medicine

## 2017-02-15 MED FILL — TRADJENTA 5 MG TABLET: 5 | 30 days supply | Qty: 30 | Fill #7

## 2017-02-17 ENCOUNTER — Ambulatory Visit (INDEPENDENT_AMBULATORY_CARE_PROVIDER_SITE_OTHER): Payer: 59 | Admitting: Osteopathic Medicine

## 2017-02-17 VITALS — BP 144/81 | HR 96 | Resp 16 | Wt 195.0 lb

## 2017-02-17 DIAGNOSIS — E291 Testicular hypofunction: Secondary | ICD-10-CM | POA: Diagnosis not present

## 2017-02-17 MED ORDER — TESTOSTERONE CYPIONATE 200 MG/ML IM SOLN
200.0000 mg | Freq: Once | INTRAMUSCULAR | Status: AC
  Administered 2017-02-17: 200 mg via INTRAMUSCULAR

## 2017-02-17 NOTE — Progress Notes (Signed)
   Subjective:    Patient ID: Jordan Taylor, male    DOB: Oct 25, 1966, 51 y.o.   MRN: [id number]  HPI Patient here for testosterone injection: he denies chest pain, shortness of breath, mood changes and headaches.    Review of Systems     Objective:   Physical Exam        Assessment & Plan:  Patient tolerated injection well without complications. Patient advised to schedule next appointment for 14 days.

## 2017-03-01 ENCOUNTER — Other Ambulatory Visit: Payer: Self-pay

## 2017-03-01 MED ORDER — BENAZEPRIL HCL 20 MG PO TABS: ORAL_TABLET | ORAL | 1 refills | Status: DC

## 2017-03-01 MED FILL — AMLODIPINE BESYLATE 5 MG TA: 5 | 90 days supply | Qty: 90 | Fill #1

## 2017-03-01 MED FILL — BENAZEPRIL HCL 20 MG TABLET: 20 | 90 days supply | Qty: 90 | Fill #0

## 2017-03-02 MED FILL — ANASTROZOLE 1 MG TABLET: 1 | 84 days supply | Qty: 36 | Fill #1

## 2017-03-04 ENCOUNTER — Ambulatory Visit (INDEPENDENT_AMBULATORY_CARE_PROVIDER_SITE_OTHER): Payer: 59 | Admitting: Family Medicine

## 2017-03-04 ENCOUNTER — Other Ambulatory Visit: Payer: Self-pay | Admitting: Family Medicine

## 2017-03-04 VITALS — BP 134/82 | HR 91 | Wt 196.0 lb

## 2017-03-04 DIAGNOSIS — N342 Other urethritis: Secondary | ICD-10-CM

## 2017-03-04 MED ORDER — METRONIDAZOLE 500 MG PO TABS: 500.0000 mg | ORAL_TABLET | Freq: Three times a day (TID) | ORAL | 0 refills | Status: DC

## 2017-03-04 MED ORDER — DOXYCYCLINE HYCLATE 100 MG PO TABS: 100.0000 mg | ORAL_TABLET | Freq: Two times a day (BID) | ORAL | 0 refills | Status: DC

## 2017-03-04 MED FILL — metroNIDAZOLE 500 MG TABS: 500 | 7 days supply | Qty: 21 | Fill #0

## 2017-03-04 MED FILL — DOXYCYCLINE HYCLATE 100 MG: 100 | 7 days supply | Qty: 14 | Fill #0

## 2017-03-04 NOTE — Patient Instructions (Addendum)
Thank you for coming in today. We will get results to you ASAP.  Return as needed.  Try urinating after sex.  Tying using a different lubricant.    Urethritis, Adult Urethritis is an inflammation of the tube through which urine exits your bladder (urethra). What are the causes? Urethritis is often caused by an infection in your urethra. The infection can be viral, like herpes. The infection can also be bacterial, like gonorrhea. What increases the risk? Risk factors of urethritis include:  Having sex without using a condom.  Having multiple sexual partners.  Having poor hygiene. What are the signs or symptoms? Symptoms of urethritis are less noticeable in women than in men. These symptoms include:  Burning feeling when you urinate (dysuria).  Discharge from your urethra.  Blood in your urine (hematuria).  Urinating more than usual. How is this diagnosed? To confirm a diagnosis of urethritis, your health care provider will do the following:  Ask about your sexual history.  Perform a physical exam.  Have you provide a sample of your urine for lab testing.  Use a cotton swab to gently collect a sample from your urethra for lab testing. How is this treated? It is important to treat urethritis. Depending on the cause, untreated urethritis may lead to serious genital infections and possibly infertility. Urethritis caused by a bacterial infection is treated with antibiotic medicine. All sexual partners must be treated. Follow these instructions at home:  Do not have sex until the test results are known and treatment is completed, even if your symptoms go away before you finish treatment.  If you were prescribed an antibiotic, finish it all even if you start to feel better. Contact a health care provider if:  Your symptoms are not improved in 3 days.  Your symptoms are getting worse.  You develop abdominal pain or pelvic pain (in women).  You develop joint pain.  You  have a fever. Get help right away if:  You have severe pain in the belly, back, or side.  You have repeated vomiting. This information is not intended to replace advice given to you by your health care provider. Make sure you discuss any questions you have with your health care provider. Document Released: 05/12/2001 Document Revised: 04/23/2016 Document Reviewed: 07/17/2013 Elsevier Interactive Patient Education  2017 [employer].

## 2017-03-04 NOTE — Progress Notes (Signed)
Jordan Taylor is a 51 y.o. male who presents to [name]: [facility] today for penile discharge and pain. Patient was seen in late February for urethritis. He was treated with ceftriaxone and azithromycin empirically. Urine culture was unremarkable however the urine gonorrhea and chlamydia test was not successfully performed. He notes that his symptoms have returned after initial resolution. He typically has anal sex without condoms. He notes some discharge and pain. He feels well otherwise no fevers or chills.   Past Medical History:  Diagnosis Date  . Essential hypertension 12/04/2013  . Type 2 diabetes mellitus (HCC) 12/04/2013   Past Surgical History:  Procedure Laterality Date  . ANTERIOR CERVICAL DECOMPRESSION/DISCECTOMY FUSION 4 LEVELS N/A 09/07/2016   Procedure: Anterior Cervical Diskectomy Fusion - Cervical three- Cervical four - Cervical four - Cervical five - Cervical five- Cervical six  - Cervical six - Cervical seven;  Surgeon: Julio Sicks, MD;  Location: [facility] OR;  Service: Neurosurgery;  Laterality: N/A;  . KNEE ARTHROSCOPY Left   . spinal cord surgery     20+ yrs ago   Social History  Substance Use Topics  . Smoking status: Former Games developer  . Smokeless tobacco: Never Used  . Alcohol use No   family history includes Alcohol abuse in his father and mother; Cancer in his mother and other; Diabetes in his father; Hypertension in his father.  ROS as above:  Medications: Current Outpatient Prescriptions  Medication Sig Dispense Refill  . amLODipine (NORVASC) 5 MG tablet Take 1 tablet (5 mg total) by mouth daily. 90 tablet 1  . benazepril (LOTENSIN) 20 MG tablet TAKE 1 TABLET (20 MG TOTAL) BY MOUTH DAILY. 90 tablet 1  . clindamycin-benzoyl peroxide (BENZACLIN) gel Apply topically 2 (two) times daily. 35 g 3  . cyclobenzaprine (FLEXERIL) 10 MG tablet Take 1 tablet (10  mg total) by mouth 3 (three) times daily as needed for muscle spasms. 30 tablet 0  . Dapagliflozin-Metformin HCl ER 08-999 MG TB24 Take 1 tablet by mouth daily. 90 tablet 1  . glipiZIDE (GLUCOTROL XL) 10 MG 24 hr tablet Take 1 tablet (10 mg total) by mouth daily with breakfast. 90 tablet 1  . linagliptin (TRADJENTA) 5 MG TABS tablet Take 1 tablet (5 mg total) by mouth daily. 30 tablet 11  . loratadine-pseudoephedrine (CLARITIN-D 24 HOUR) 10-240 MG 24 hr tablet Take 1 tablet by mouth daily. 30 tablet 2  . pravastatin (PRAVACHOL) 20 MG tablet Take 1 tablet (20 mg total) by mouth every evening. 30 tablet 11  . sildenafil (REVATIO) 20 MG tablet TAKE 1 TO 4 TABLETS BY MOUTH DAILY AS NEEDED FOR SEX 50 tablet 4  . testosterone cypionate (DEPOTESTOTERONE CYPIONATE) 100 MG/ML injection Inject 200 mg into the muscle. For IM use only    . doxycycline (VIBRA-TABS) 100 MG tablet Take 1 tablet (100 mg total) by mouth 2 (two) times daily. 14 tablet 0  . metroNIDAZOLE (FLAGYL) 500 MG tablet Take 1 tablet (500 mg total) by mouth 3 (three) times daily. 21 tablet 0   No current facility-administered medications for this visit.    No Known Allergies  Health Maintenance Health Maintenance  Topic Date Due  . HEMOGLOBIN A1C  03/03/2017  . Hepatitis C Screening  01/13/2018 (Originally Jan 16, 1966)  . OPHTHALMOLOGY EXAM  01/21/2018 (Originally 03/07/2016)  . INFLUENZA VACCINE  06/30/2017  . FOOT EXAM  01/18/2018  . Fecal DNA (Cologuard)  06/06/2019  . PNEUMOCOCCAL POLYSACCHARIDE VACCINE (2)  09/24/2021  . TETANUS/TDAP  12/10/2024  . HIV Screening  Completed     Exam:  BP 134/82   Pulse 91   Wt 196 lb (88.9 kg)   BMI 32.12 kg/m  Gen: Well NAD    No results found for this or any previous visit (from the past 72 hour(s)). No results found.    Assessment and Plan: 51 y.o. male with Urethritis. Urine culture GC chlamydia pending. Empiric treatment with doxycycline and Flagyl.    Orders Placed This  Encounter  Procedures  . Urine culture  . GC Probe amplification, urine   Meds ordered this encounter  Medications  . doxycycline (VIBRA-TABS) 100 MG tablet    Sig: Take 1 tablet (100 mg total) by mouth 2 (two) times daily.    Dispense:  14 tablet    Refill:  0  . metroNIDAZOLE (FLAGYL) 500 MG tablet    Sig: Take 1 tablet (500 mg total) by mouth 3 (three) times daily.    Dispense:  21 tablet    Refill:  0     Discussed warning signs or symptoms. Please see discharge instructions. Patient expresses understanding.

## 2017-03-05 LAB — NEISSERIA GONORRHOEAE, PROBE AMP: GC Probe RNA: NOT DETECTED

## 2017-03-06 LAB — URINE CULTURE: Organism ID, Bacteria: NO GROWTH

## 2017-03-08 ENCOUNTER — Encounter: Payer: Self-pay | Admitting: Family Medicine

## 2017-03-10 ENCOUNTER — Ambulatory Visit (INDEPENDENT_AMBULATORY_CARE_PROVIDER_SITE_OTHER): Payer: 59 | Admitting: Osteopathic Medicine

## 2017-03-10 VITALS — BP 124/78 | HR 105 | Wt 197.0 lb

## 2017-03-10 DIAGNOSIS — H5203 Hypermetropia, bilateral: Secondary | ICD-10-CM | POA: Diagnosis not present

## 2017-03-10 DIAGNOSIS — E119 Type 2 diabetes mellitus without complications: Secondary | ICD-10-CM | POA: Diagnosis not present

## 2017-03-10 DIAGNOSIS — H52223 Regular astigmatism, bilateral: Secondary | ICD-10-CM | POA: Diagnosis not present

## 2017-03-10 DIAGNOSIS — Z7984 Long term (current) use of oral hypoglycemic drugs: Secondary | ICD-10-CM | POA: Diagnosis not present

## 2017-03-10 DIAGNOSIS — E291 Testicular hypofunction: Secondary | ICD-10-CM

## 2017-03-10 DIAGNOSIS — H524 Presbyopia: Secondary | ICD-10-CM | POA: Diagnosis not present

## 2017-03-10 LAB — HM DIABETES EYE EXAM

## 2017-03-10 MED ORDER — TESTOSTERONE CYPIONATE 200 MG/ML IM SOLN
200.0000 mg | Freq: Once | INTRAMUSCULAR | Status: AC
  Administered 2017-03-10: 200 mg via INTRAMUSCULAR

## 2017-03-10 MED ORDER — DOXYCYCLINE HYCLATE 100 MG PO TABS: 100.0000 mg | ORAL_TABLET | Freq: Two times a day (BID) | ORAL | 2 refills | Status: DC

## 2017-03-10 MED FILL — SILDENAFIL 20 MG TABLET: 20 | 30 days supply | Qty: 50 | Fill #2

## 2017-03-10 NOTE — Progress Notes (Signed)
Patient came into office today for testosterone injection. Denies chest pain, shortness of breath, headaches and problems associated with taking this medication. Patient states he has had no abnornal mood swings. Patient tolerated injection in LUOQ well without complications. Patient advised to schedule his next injection for 2 weeks from today. Pt is due to DM follow up with PCP, this was scheduled prior to leaving clinic. Pt also is going to get a DM eye check up today, will have office fax over report.

## 2017-03-11 MED FILL — DOXYCYCLINE HYCLATE 100 MG: 100 | 7 days supply | Qty: 14 | Fill #0

## 2017-03-12 ENCOUNTER — Encounter: Payer: Self-pay | Admitting: Osteopathic Medicine

## 2017-03-22 DIAGNOSIS — E291 Testicular hypofunction: Secondary | ICD-10-CM | POA: Diagnosis not present

## 2017-03-22 DIAGNOSIS — N5201 Erectile dysfunction due to arterial insufficiency: Secondary | ICD-10-CM | POA: Diagnosis not present

## 2017-03-24 ENCOUNTER — Ambulatory Visit: Payer: 59 | Admitting: Osteopathic Medicine

## 2017-03-24 ENCOUNTER — Other Ambulatory Visit: Payer: Self-pay

## 2017-03-24 DIAGNOSIS — I10 Essential (primary) hypertension: Secondary | ICD-10-CM

## 2017-03-24 DIAGNOSIS — E119 Type 2 diabetes mellitus without complications: Secondary | ICD-10-CM

## 2017-03-24 MED ORDER — GLIPIZIDE ER 10 MG PO TB24: 10.0000 mg | ORAL_TABLET | Freq: Every day | ORAL | 1 refills | Status: DC

## 2017-03-24 MED ORDER — AMLODIPINE BESYLATE 5 MG PO TABS: 5.0000 mg | ORAL_TABLET | Freq: Every day | ORAL | 1 refills | Status: DC

## 2017-03-24 MED ORDER — LINAGLIPTIN 5 MG PO TABS: 5.0000 mg | ORAL_TABLET | Freq: Every day | ORAL | 1 refills | Status: DC

## 2017-03-24 MED ORDER — PRAVASTATIN SODIUM 20 MG PO TABS: 20.0000 mg | ORAL_TABLET | Freq: Every evening | ORAL | 1 refills | Status: DC

## 2017-03-24 MED ORDER — DAPAGLIFLOZIN PRO-METFORMIN ER 10-1000 MG PO TB24: 1.0000 | ORAL_TABLET | Freq: Every day | ORAL | 1 refills | Status: DC

## 2017-03-24 MED FILL — XIGDUO XR 10 MG-1,000 MG TA: 10-1000 | 90 days supply | Qty: 90 | Fill #0

## 2017-03-24 MED FILL — glipiZIDE XL 10 MG TB24: 10 | 90 days supply | Qty: 90 | Fill #0

## 2017-03-24 MED FILL — TRADJENTA 5 MG TABLET: 5 | 90 days supply | Qty: 90 | Fill #0

## 2017-03-24 MED FILL — PRAVASTATIN NA 20 MG TAB: 20 | 90 days supply | Qty: 90 | Fill #0

## 2017-03-31 ENCOUNTER — Ambulatory Visit (INDEPENDENT_AMBULATORY_CARE_PROVIDER_SITE_OTHER): Payer: 59 | Admitting: Osteopathic Medicine

## 2017-03-31 ENCOUNTER — Encounter: Payer: Self-pay | Admitting: Osteopathic Medicine

## 2017-03-31 ENCOUNTER — Ambulatory Visit: Payer: 59 | Admitting: Osteopathic Medicine

## 2017-03-31 VITALS — BP 133/75 | HR 100 | Wt 192.0 lb

## 2017-03-31 DIAGNOSIS — E669 Obesity, unspecified: Secondary | ICD-10-CM

## 2017-03-31 DIAGNOSIS — N529 Male erectile dysfunction, unspecified: Secondary | ICD-10-CM | POA: Diagnosis not present

## 2017-03-31 DIAGNOSIS — M4802 Spinal stenosis, cervical region: Secondary | ICD-10-CM | POA: Diagnosis not present

## 2017-03-31 DIAGNOSIS — J302 Other seasonal allergic rhinitis: Secondary | ICD-10-CM | POA: Diagnosis not present

## 2017-03-31 DIAGNOSIS — E291 Testicular hypofunction: Secondary | ICD-10-CM

## 2017-03-31 DIAGNOSIS — I10 Essential (primary) hypertension: Secondary | ICD-10-CM

## 2017-03-31 DIAGNOSIS — E119 Type 2 diabetes mellitus without complications: Secondary | ICD-10-CM

## 2017-03-31 LAB — POCT GLYCOSYLATED HEMOGLOBIN (HGB A1C): Hemoglobin A1C: 7.9

## 2017-03-31 MED ORDER — PHENTERMINE HCL 37.5 MG PO TABS
37.5000 mg | ORAL_TABLET | Freq: Every day | ORAL | 0 refills | Status: DC
Start: 2017-03-31 — End: 2017-05-07

## 2017-03-31 MED ORDER — TESTOSTERONE CYPIONATE 200 MG/ML IM SOLN
200.0000 mg | INTRAMUSCULAR | Status: DC
  Administered 2017-03-31: 200 mg via INTRAMUSCULAR

## 2017-03-31 MED ORDER — LORATADINE-PSEUDOEPHEDRINE ER 10-240 MG PO TB24: 1.0000 | ORAL_TABLET | Freq: Every day | ORAL | 3 refills | Status: DC

## 2017-03-31 MED ORDER — SILDENAFIL CITRATE 20 MG PO TABS: ORAL_TABLET | ORAL | 4 refills | Status: DC

## 2017-03-31 MED FILL — ALLERGY RELIEF-NASAL DECONG: 10-240 | 30 days supply | Qty: 30 | Fill #0

## 2017-03-31 MED FILL — SILDENAFIL 20 MG TABLET: 20 | 30 days supply | Qty: 50 | Fill #0
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
## 2017-04-07 DIAGNOSIS — E291 Testicular hypofunction: Secondary | ICD-10-CM | POA: Diagnosis not present

## 2017-04-08 LAB — TESTOSTERONE TOTAL,FREE,BIO, MALES
Albumin: 3.8 g/dL (ref 3.6–5.1)
Sex Hormone Binding: 27 nmol/L (ref 10–50)
Testosterone, Bioavailable: 268.1 ng/dL (ref 110.0–575.0)
Testosterone, Free: 153 pg/mL (ref 46.0–224.0)
Testosterone: 808 ng/dL (ref 250–827)

## 2017-04-14 ENCOUNTER — Ambulatory Visit (INDEPENDENT_AMBULATORY_CARE_PROVIDER_SITE_OTHER): Payer: 59 | Admitting: Osteopathic Medicine

## 2017-04-14 VITALS — BP 140/84 | HR 96 | Wt 198.0 lb

## 2017-04-14 DIAGNOSIS — E291 Testicular hypofunction: Secondary | ICD-10-CM | POA: Diagnosis not present

## 2017-04-14 MED ORDER — "SYRINGE/NEEDLE (DISP) 22G X 1-1/2"" 3 ML MISC": 1 refills | Status: DC

## 2017-04-14 MED ORDER — "NEEDLE (DISP) 18G X 1-1/2"" MISC": 1 refills | Status: DC

## 2017-04-14 MED ORDER — TESTOSTERONE CYPIONATE 200 MG/ML IM SOLN
200.0000 mg | INTRAMUSCULAR | Status: DC
  Administered 2017-04-14: 200 mg via INTRAMUSCULAR

## 2017-04-14 MED ORDER — TESTOSTERONE CYPIONATE 200 MG/ML IM SOLN: 200.0000 mg | INTRAMUSCULAR | 1 refills | Status: DC

## 2017-04-14 MED FILL — BD 3 ML SYRINGE WITH NEEDLE: 23G X 1-1/2 | 90 days supply | Qty: 6 | Fill #0

## 2017-04-14 MED FILL — TESTOSTERONE CYP 200 MG/ML: 200 | 84 days supply | Qty: 6 | Fill #0

## 2017-04-14 MED FILL — PHENTERMINE 37.5 MG TABLET: 37.5 | 30 days supply | Qty: 30 | Fill #0

## 2017-04-14 NOTE — Progress Notes (Signed)
Patient was in the office for testosterone injection. 200 mg of Depo testosterone  was given LUOQ. Patient denied any headaches, dizziness or shortness of breath. Patient was given a Rx for Testosterone and he will get it filled a bring to his next visit to get education on how to self give. Rhonda Cunningham,CMA

## 2017-04-26 ENCOUNTER — Encounter: Payer: Self-pay | Admitting: Family Medicine

## 2017-04-26 DIAGNOSIS — Z202 Contact with and (suspected) exposure to infections with a predominantly sexual mode of transmission: Secondary | ICD-10-CM

## 2017-04-27 DIAGNOSIS — Z202 Contact with and (suspected) exposure to infections with a predominantly sexual mode of transmission: Secondary | ICD-10-CM | POA: Diagnosis not present

## 2017-04-29 LAB — HSV(HERPES SIMPLEX VRS) I + II AB-IGG
HSV 1 Glycoprotein G Ab, IgG: 24.4 Index — ABNORMAL HIGH (ref <0.90)
HSV 2 Glycoprotein G Ab, IgG: <0.9 Index (ref <0.90)

## 2017-04-30 ENCOUNTER — Encounter: Payer: Self-pay | Admitting: Family Medicine

## 2017-05-07 ENCOUNTER — Ambulatory Visit (INDEPENDENT_AMBULATORY_CARE_PROVIDER_SITE_OTHER): Payer: 59 | Admitting: Family Medicine

## 2017-05-07 ENCOUNTER — Encounter: Payer: Self-pay | Admitting: Family Medicine

## 2017-05-07 VITALS — BP 145/85 | HR 91 | Wt 199.0 lb

## 2017-05-07 DIAGNOSIS — E669 Obesity, unspecified: Secondary | ICD-10-CM

## 2017-05-07 DIAGNOSIS — Z1159 Encounter for screening for other viral diseases: Secondary | ICD-10-CM | POA: Diagnosis not present

## 2017-05-07 DIAGNOSIS — B009 Herpesviral infection, unspecified: Secondary | ICD-10-CM | POA: Diagnosis not present

## 2017-05-07 MED ORDER — PHENTERMINE HCL 37.5 MG PO TABS: 37.5000 mg | ORAL_TABLET | Freq: Every day | ORAL | 0 refills | Status: DC

## 2017-05-07 NOTE — Progress Notes (Signed)
Jordan Taylor is a 51 y.o. male who [name] today for follow up herpes test and discuss weight loss.   Herpes test: Mr Jordan Taylor's girlfriend had some sort of positive herpes test (he is not sure). He asked for a blood herpes test a few days ago and is here for follow up results. He denies any personal history of herpes either genital or oral. He feels well and is currently asymptomatic.   Weight Loss: Mr Jordan Taylor is currently undergoing a weight loss attempt via his primary care provider. He was originally scheduled for nurse visit for weight check and blood pressure check with phentermine. He thinks the phentermine is working well and he would like to continue it. He has been taking it for one month. He denies feeling too hot jitteriness or dry mouth. He denies palpitations or chest pain.    Past Medical History:  Diagnosis Date  . Essential hypertension 12/04/2013  . Type 2 diabetes mellitus (HCC) 12/04/2013   Past Surgical History:  Procedure Laterality Date  . ANTERIOR CERVICAL DECOMPRESSION/DISCECTOMY FUSION 4 LEVELS N/A 09/07/2016   Procedure: Anterior Cervical Diskectomy Fusion - Cervical three- Cervical four - Cervical four - Cervical five - Cervical five- Cervical six  - Cervical six - Cervical seven;  Surgeon: Julio SicksHenry Pool, MD;  [facility] OR;  Service: Neurosurgery;  Laterality: N/A;  . KNEE ARTHROSCOPY Left   . spinal cord surgery     20+ yrs ago   Social History  Substance Use Topics  . Smoking status: Former Games developermoker  . Smokeless tobacco: Never Used  . Alcohol use No   family history includes Alcohol abuse in his father and mother; Cancer in his mother and other; Diabetes in his father; Hypertension in his father.  ROS as above:  Medications: Current Outpatient Prescriptions  Medication Sig Dispense Refill  . amLODipine (NORVASC) 5 MG  tablet Take 1 tablet (5 mg total) by mouth daily. 90 tablet 1  . benazepril (LOTENSIN) 20 MG tablet TAKE 1 TABLET (20 MG TOTAL) BY MOUTH DAILY. 90 tablet 1  . clindamycin-benzoyl peroxide (BENZACLIN) gel Apply topically 2 (two) times daily. 35 g 3  . cyclobenzaprine (FLEXERIL) 10 MG tablet Take 1 tablet (10 mg total) by mouth 3 (three) times daily as needed for muscle spasms. 30 tablet 0  . Dapagliflozin-Metformin HCl ER 08-999 MG TB24 Take 1 tablet by mouth daily. 90 tablet 1  . glipiZIDE (GLUCOTROL XL) 10 MG 24 hr tablet Take 1 tablet (10 mg total) by mouth daily with breakfast. 90 tablet 1  . linagliptin (TRADJENTA) 5 MG TABS tablet Take 1 tablet (5 mg total) by mouth daily. 90 tablet 1  . loratadine-pseudoephedrine (CLARITIN-D 24 HOUR) 10-240 MG 24 hr tablet Take 1 tablet by mouth daily. 90 tablet 3  . metroNIDAZOLE (FLAGYL) 500 MG tablet Take 1 tablet (500 mg total) by mouth 3 (three) times daily. 21 tablet 0  . NEEDLE, DISP, 18 G (B-D BLUNT FILL NEEDLE) 18G X 1-1/2" MISC Use to draw up testosterone every 14 days. Dx: E29.1 100 each 1  . phentermine (ADIPEX-P) 37.5 MG tablet Take 1 tablet (37.5 mg total) by mouth daily before breakfast. Due for weight check prior to refill. Plan for this medication x12 weeks maximum 30 tablet 0  . pravastatin (PRAVACHOL) 20 MG tablet Take 1 tablet (20 mg total) by mouth every evening. 90 tablet 1  . sildenafil (REVATIO) 20 MG tablet  TAKE 1 TO 4 TABLETS BY MOUTH DAILY AS NEEDED FOR SEX 50 tablet 4  . SYRINGE-NEEDLE, DISP, 3 ML 22G X 1-1/2" 3 ML MISC Use to inject testosterone every 14 days. Dx: E29.1 100 each 1  . testosterone cypionate (DEPOTESTOSTERONE CYPIONATE) 200 MG/ML injection Inject 1 mL (200 mg total) into the muscle every 14 (fourteen) days. 10 mL 1  . testosterone cypionate (DEPOTESTOTERONE CYPIONATE) 100 MG/ML injection Inject 200 mg into the muscle. For IM use only     Current Facility-Administered Medications  Medication Dose Route Frequency  Provider Last Rate Last Dose  . testosterone cypionate (DEPOTESTOSTERONE CYPIONATE) injection 200 mg  200 mg Intramuscular Q14 Days Sunnie Nielsen, DO   200 mg at 03/31/17 1415  . testosterone cypionate (DEPOTESTOSTERONE CYPIONATE) injection 200 mg  200 mg Intramuscular Q14 Days Sunnie Nielsen, DO   200 mg at 04/14/17 1013   No Known Allergies  Health Maintenance Health Maintenance  Topic Date Due  . Hepatitis C Screening  01/13/2018 (Originally 10-15-1966)  . INFLUENZA VACCINE  06/30/2017  . HEMOGLOBIN A1C  10/01/2017  . FOOT EXAM  01/18/2018  . OPHTHALMOLOGY EXAM  03/10/2018  . Fecal DNA (Cologuard)  06/06/2019  . PNEUMOCOCCAL POLYSACCHARIDE VACCINE (2) 09/24/2021  . TETANUS/TDAP  12/10/2024  . HIV Screening  Completed     Exam:  BP (!) 145/85   Pulse 91   Wt 199 lb (90.3 kg)   BMI 32.61 kg/m   Wt Readings from Last 10 Encounters:  05/07/17 199 lb (90.3 kg)  04/14/17 198 lb (89.8 kg)  03/31/17 192 lb (87.1 kg)  03/10/17 197 lb (89.4 kg)  03/04/17 196 lb (88.9 kg)  02/17/17 195 lb (88.5 kg)  01/28/17 198 lb 1.6 oz (89.9 kg)  01/25/17 195 lb (88.5 kg)  12/31/16 199 lb 1.9 oz (90.3 kg)  12/02/16 197 lb (89.4 kg)    Gen: Well NAD HEENT: EOMI,  MMM Lungs: Normal work of breathing. CTABL Heart: RRR no MRG Abd: NABS, Soft. Nondistended, Nontender Exts: Brisk capillary refill, warm and well perfused.   Component     Latest Ref Rng & Units 04/27/2017  HSV 1 Glycoprotein G Ab, IgG     <0.90 Index 24.40 (H)  HSV 2 Glycoprotein G Ab, IgG     <0.90 Index <0.90    No results found for this or any previous visit (from the past 72 hour(s)). No results found.    Assessment and Plan: 51 y.o. male with  Herpes follow-up. 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 I explained that this is probably due to a childhood exposure likely cold sores. I expressed doubt that he has genital herpes. Plan for watchful waiting in the future.  Weight management: Blood pressure is  a bit elevated. We discussed safety was phentermine. Plan to continue phentermine for one more month with diet and exercise. Follow-up with PCP.   No orders of the defined types were placed in this encounter.  No orders of the defined types were placed in this encounter.    Discussed warning signs or symptoms. Please see discharge instructions. Patient expresses understanding.

## 2017-05-07 NOTE — Patient Instructions (Signed)
Thank you for coming in today. Continue phentermine.  I do not think you have genital herpes.  Recheck as needed for phentermine.  Try to look up your carbs and eat less the 45g of carbs per meal.   Return to me as needed.

## 2017-05-11 MED FILL — PHENTERMINE 37.5 MG TABLET: 37.5 | 30 days supply | Qty: 30 | Fill #0

## 2017-05-19 ENCOUNTER — Ambulatory Visit: Payer: 59

## 2017-05-24 ENCOUNTER — Encounter: Payer: Self-pay | Admitting: Family Medicine

## 2017-05-24 MED ORDER — AZITHROMYCIN 250 MG PO TABS: ORAL_TABLET | ORAL | 0 refills | Status: DC

## 2017-05-24 MED FILL — DOXYCYCLINE HYCLATE 100 MG: 100 | 7 days supply | Qty: 14 | Fill #1

## 2017-05-24 MED FILL — AZITHROMYCIN 250 MG TABLET: 250 | 1 days supply | Qty: 4 | Fill #0

## 2017-06-07 MED FILL — BENAZEPRIL HCL 20 MG TABLET: 20 | 90 days supply | Qty: 90 | Fill #1

## 2017-06-07 MED FILL — SILDENAFIL 20 MG TABLET: 20 | 30 days supply | Qty: 50 | Fill #1

## 2017-06-07 MED FILL — AMLODIPINE BESYLATE 5 MG TA: 5 | 90 days supply | Qty: 90 | Fill #0

## 2017-06-09 ENCOUNTER — Encounter: Payer: Self-pay | Admitting: Family Medicine

## 2017-06-09 ENCOUNTER — Ambulatory Visit (INDEPENDENT_AMBULATORY_CARE_PROVIDER_SITE_OTHER): Payer: 59 | Admitting: Family Medicine

## 2017-06-09 VITALS — BP 141/81 | HR 102 | Wt 204.0 lb

## 2017-06-09 DIAGNOSIS — M778 Other enthesopathies, not elsewhere classified: Secondary | ICD-10-CM | POA: Diagnosis not present

## 2017-06-09 DIAGNOSIS — E669 Obesity, unspecified: Secondary | ICD-10-CM | POA: Diagnosis not present

## 2017-06-09 DIAGNOSIS — E291 Testicular hypofunction: Secondary | ICD-10-CM | POA: Diagnosis not present

## 2017-06-09 DIAGNOSIS — N410 Acute prostatitis: Secondary | ICD-10-CM | POA: Diagnosis not present

## 2017-06-09 DIAGNOSIS — M779 Enthesopathy, unspecified: Secondary | ICD-10-CM

## 2017-06-09 MED ORDER — PHENTERMINE HCL 37.5 MG PO TABS: 37.5000 mg | ORAL_TABLET | Freq: Every day | ORAL | 0 refills | Status: DC

## 2017-06-09 MED ORDER — TAMSULOSIN HCL 0.4 MG PO CAPS: 0.4000 mg | ORAL_CAPSULE | Freq: Every day | ORAL | 3 refills | Status: DC

## 2017-06-09 MED ORDER — CIPROFLOXACIN HCL 500 MG PO TABS: 500.0000 mg | ORAL_TABLET | Freq: Two times a day (BID) | ORAL | 0 refills | Status: DC

## 2017-06-09 MED FILL — TAMSULOSIN HCL 0.4 MG CAP: 0.4 | 90 days supply | Qty: 90 | Fill #0

## 2017-06-09 MED FILL — CIPROFLOXACIN HCL 500 MG TA: 500 | 30 days supply | Qty: 60 | Fill #0

## 2017-06-09 NOTE — Patient Instructions (Signed)
Thank you for coming in today. Get labs today.  Start cipro twice daily for prostatitis.  Start flomax daily for enlarged prostate.   Attend HAND PT for hand tendonitis.   Recheck with me in 1 month.    Prostatitis Prostatitis is swelling or inflammation of the prostate gland. The prostate is a walnut-sized gland that is involved in the production of semen. It is located below a man's bladder, in front of the rectum. There are four types of prostatitis:  Chronic nonbacterial prostatitis. This is the most common type of prostatitis. It may be associated with a viral infection or autoimmune disorder.  Acute bacterial prostatitis. This is the least common type of prostatitis. It starts quickly and is usually associated with a bladder infection, high fever, and shaking chills. It can occur at any age.  Chronic bacterial prostatitis. This type usually results from acute bacterial prostatitis that happens repeatedly (is recurrent) or has not been treated properly. It can occur in men of any age but is most common among middle-aged men whose prostate has begun to get larger. The symptoms are not as severe as symptoms caused by acute bacterial prostatitis.  Prostatodynia or chronic pelvic pain syndrome (CPPS). This type is also called pelvic floor disorder. It is associated with increased muscular tone in the pelvis surrounding the prostate.  What are the causes? Bacterial prostatitis is caused by infection from bacteria. Chronic nonbacterial prostatitis may be caused by:  Urinary tract infections (UTIs).  Nerve damage.  A response by the body's disease-fighting system (autoimmune response).  Chemicals in the urine.  The causes of the other types of prostatitis are usually not known. What are the signs or symptoms? Symptoms of this condition vary depending upon the type of prostatitis. If you have acute bacterial prostatitis, you may experience:  Urinary symptoms, such as: ? Painful  urination. ? Burning during urination. ? Frequent and sudden urges to urinate. ? Inability to start urinating. ? A weak or interrupted stream of urine.  Vomiting.  Nausea.  Fever.  Chills.  Inability to empty the bladder completely.  Pain in the: ? Muscles or joints. ? Lower back. ? Lower abdomen.  If you have any of the other types of prostatitis, you may experience:  Urinary symptoms, such as: ? Sudden urges to urinate. ? Frequent urination. ? Difficulty starting urination. ? Weak urine stream. ? Dribbling after urination.  Discharge from the urethra. The urethra is a tube that opens at the end of the penis.  Pain in the: ? Testicles. ? Penis or tip of the penis. ? Rectum. ? Area in front of the rectum and below the scrotum (perineum).  Problems with sexual function.  Painful ejaculation.  Bloody semen.  How is this diagnosed? This condition may be diagnosed based on:  A physical and medical exam.  Your symptoms.  A urine test to check for bacteria.  An exam in which a health care provider uses a finger to feel the prostate (digital rectal exam).  A test of a sample of semen.  Blood tests.  Ultrasound.  Removal of prostate tissue to be examined under a microscope (biopsy).  Tests to check how your body handles urine (urodynamic tests).  A test to look inside your bladder or urethra (cystoscopy).  How is this treated? Treatment for this condition depends on the type of prostatitis. Treatment may involve:  Medicines to relieve pain or inflammation.  Medicines to help relax your muscles.  Physical therapy.  Heat therapy.  Techniques to help you control certain body functions (biofeedback).  Relaxation exercises.  Antibiotic medicine, if your condition is caused by bacteria.  Warm water baths (sitz baths). Sitz baths help with relaxing your pelvic floor muscles, which helps to relieve pressure on the prostate.  Follow these  instructions at home:  Take over-the-counter and prescription medicines only as told by your health care provider.  If you were prescribed an antibiotic, take it as told by your health care provider. Do not stop taking the antibiotic even if you start to feel better.  If physical therapy, biofeedback, or relaxation exercises were prescribed, do exercises as instructed.  Take sitz baths as directed by your health care provider. For a sitz bath, sit in warm water that is deep enough to cover your hips and buttocks.  Keep all follow-up visits as told by your health care provider. This is important. Contact a health care provider if:  Your symptoms get worse.  You have a fever. Get help right away if:  You have chills.  You feel nauseous.  You vomit.  You feel light-headed or feel like you are going to faint.  You are unable to urinate.  You have blood or blood clots in your urine. This information is not intended to replace advice given to you by your health care provider. Make sure you discuss any questions you have with your health care provider. Document Released: 11/13/2000 Document Revised: 08/06/2016 Document Reviewed: 08/06/2016 Elsevier Interactive Patient Education  2017 [employer]

## 2017-06-09 NOTE — Progress Notes (Signed)
Jordan Taylor is a 51 y.o. male who presents to [name]: Primary Care Sports Medicine today for follow-up urinary symptoms. Patient notes a nearly 2 month history of urinary frequency and dysuria incomplete bladder emptying nocturia and weak urinary stream. He is concerned he may have an enlarged prostate. He notes his symptoms are quite obnoxious.  AUASS: 33/35 Quality of life score: 6/6  Left dorsal hand swelling: Patient has a history of left hand weakness and flexor contraction due to cervical radicular symptoms. He notes he had a cervical surgery now has better feeling and strength into his hand. Over the last several weeks he developed some swelling across the dorsal aspect of his hand that is mildly tender. He denies any new radiating pain weakness or numbness.  Phentermine: Patient has been taking phentermine now for 2 months. He notes that suppresses his appetite and he feels well. He would like to continue this medicine for one more month if possible.   Past Medical History:  Diagnosis Date  . Essential hypertension 12/04/2013  . Type 2 diabetes mellitus (HCC) 12/04/2013   Past Surgical History:  Procedure Laterality Date  . ANTERIOR CERVICAL DECOMPRESSION/DISCECTOMY FUSION 4 LEVELS N/A 09/07/2016   Procedure: Anterior Cervical Diskectomy Fusion - Cervical three- Cervical four - Cervical four - Cervical five - Cervical five- Cervical six  - Cervical six - Cervical seven;  Surgeon: Julio Sicks, MD;  Location: [facility] OR;  Service: Neurosurgery;  Laterality: N/A;  . KNEE ARTHROSCOPY Left   . spinal cord surgery     20+ yrs ago   Social History  Substance Use Topics  . Smoking status: Former Games developer  . Smokeless tobacco: Never Used  . Alcohol use No   family history includes Alcohol abuse in his father and mother; Cancer in his mother and other; Diabetes in his father; Hypertension in his  father.  ROS as above:  Medications: Current Outpatient Prescriptions  Medication Sig Dispense Refill  . amLODipine (NORVASC) 5 MG tablet Take 1 tablet (5 mg total) by mouth daily. 90 tablet 1  . benazepril (LOTENSIN) 20 MG tablet TAKE 1 TABLET (20 MG TOTAL) BY MOUTH DAILY. 90 tablet 1  . cyclobenzaprine (FLEXERIL) 10 MG tablet Take 1 tablet (10 mg total) by mouth 3 (three) times daily as needed for muscle spasms. 30 tablet 0  . Dapagliflozin-Metformin HCl ER 08-999 MG TB24 Take 1 tablet by mouth daily. 90 tablet 1  . glipiZIDE (GLUCOTROL XL) 10 MG 24 hr tablet Take 1 tablet (10 mg total) by mouth daily with breakfast. 90 tablet 1  . linagliptin (TRADJENTA) 5 MG TABS tablet Take 1 tablet (5 mg total) by mouth daily. 90 tablet 1  . loratadine-pseudoephedrine (CLARITIN-D 24 HOUR) 10-240 MG 24 hr tablet Take 1 tablet by mouth daily. 90 tablet 3  . metroNIDAZOLE (FLAGYL) 500 MG tablet Take 1 tablet (500 mg total) by mouth 3 (three) times daily. 21 tablet 0  . NEEDLE, DISP, 18 G (B-D BLUNT FILL NEEDLE) 18G X 1-1/2" MISC Use to draw up testosterone every 14 days. Dx: E29.1 100 each 1  . pravastatin (PRAVACHOL) 20 MG tablet Take 1 tablet (20 mg total) by mouth every evening. 90 tablet 1  . sildenafil (REVATIO) 20 MG tablet TAKE 1 TO 4 TABLETS BY MOUTH DAILY AS NEEDED FOR SEX 50 tablet 4  . SYRINGE-NEEDLE, DISP, 3 ML 22G X 1-1/2" 3 ML MISC Use to inject testosterone every 14 days. Dx: E29.1 100  each 1  . testosterone cypionate (DEPOTESTOSTERONE CYPIONATE) 200 MG/ML injection Inject 1 mL (200 mg total) into the muscle every 14 (fourteen) days. 10 mL 1  . testosterone cypionate (DEPOTESTOTERONE CYPIONATE) 100 MG/ML injection Inject 200 mg into the muscle. For IM use only    . ciprofloxacin (CIPRO) 500 MG tablet Take 1 tablet (500 mg total) by mouth 2 (two) times daily. 60 tablet 0  . phentermine (ADIPEX-P) 37.5 MG tablet Take 1 tablet (37.5 mg total) by mouth daily before breakfast. Due for weight check  prior to refill. Plan for this medication x12 weeks maximum 30 tablet 0  . tamsulosin (FLOMAX) 0.4 MG CAPS capsule Take 1 capsule (0.4 mg total) by mouth daily after breakfast. 90 capsule 3   Current Facility-Administered Medications  Medication Dose Route Frequency Provider Last Rate Last Dose  . testosterone cypionate (DEPOTESTOSTERONE CYPIONATE) injection 200 mg  200 mg Intramuscular Q14 Days Sunnie NielsenAlexander, Natalie, DO   200 mg at 03/31/17 1415  . testosterone cypionate (DEPOTESTOSTERONE CYPIONATE) injection 200 mg  200 mg Intramuscular Q14 Days Sunnie NielsenAlexander, Natalie, DO   200 mg at 04/14/17 1013   No Known Allergies  Health Maintenance Health Maintenance  Topic Date Due  . Hepatitis C Screening  01/13/2018 (Originally 02-21-1966)  . INFLUENZA VACCINE  06/30/2017  . HEMOGLOBIN A1C  10/01/2017  . FOOT EXAM  01/18/2018  . OPHTHALMOLOGY EXAM  03/10/2018  . Fecal DNA (Cologuard)  06/06/2019  . PNEUMOCOCCAL POLYSACCHARIDE VACCINE (2) 09/24/2021  . TETANUS/TDAP  12/10/2024  . HIV Screening  Completed     Exam:  BP (!) 141/81   Pulse (!) 102   Wt 204 lb (92.5 kg)   SpO2 98%   BMI 33.43 kg/m   Wt Readings from Last 10 Encounters:  06/09/17 204 lb (92.5 kg)  05/07/17 199 lb (90.3 kg)  04/14/17 198 lb (89.8 kg)  03/31/17 192 lb (87.1 kg)  03/10/17 197 lb (89.4 kg)  03/04/17 196 lb (88.9 kg)  02/17/17 195 lb (88.5 kg)  01/28/17 198 lb 1.6 oz (89.9 kg)  01/25/17 195 lb (88.5 kg)  12/31/16 199 lb 1.9 oz (90.3 kg)    Gen: Well NAD HEENT: EOMI,  MMM Lungs: Normal work of breathing. CTABL Heart: RRR no MRG Abd: NABS, Soft. Nondistended, Nontender Exts: Brisk capillary refill, warm and well perfused.  Rectal exam small external hemorrhoids present. Prostate is enlarged boggy and tender with no nodules palpated. MSK: Left hand swollen mildly tender small mass across the dorsal aspect of the hand overlying the fourth dorsal wrist compartment. Hand extension and finger extension is  limited. Pulses capillary refill are intact.   Limited musculoskeletal ultrasound reveals hypoechoic fluid change within the fourth dorsal wrist compartment as the area of swelling. Normal bony structures.  Lab Results  Component Value Date   PSA 2.09 03/04/2016   PSA 1.38 01/28/2015      No results found for this or any previous visit (from the past 72 hour(s)). No results found.    Assessment and Plan: 51 y.o. male with Urinary symptoms likely due to prostatitis with underlying BPH. Plan to treat with one month of Cipro today. Additionally will use Flomax. Will check PSA. Previously values have been reasonably well-controlled.  Left dorsal hand swelling. Tenosynovitis of the fourth dorsal wrist compartment. Patient now has more motion into his hand. I think is reasonable to treat this with referral to hand therapy. Plan for recheck in about a month.  Phentermine: Doing reasonably well refill phentermine  recheck in one month.   Orders Placed This Encounter  Procedures  . PSA  . Ambulatory referral to Physical Therapy    Referral Priority:   Routine    Referral Type:   Physical Medicine    Referral Reason:   Specialty Services Required    Requested Specialty:   Physical Therapy    Number of Visits Requested:   1   Meds ordered this encounter  Medications  . ciprofloxacin (CIPRO) 500 MG tablet    Sig: Take 1 tablet (500 mg total) by mouth 2 (two) times daily.    Dispense:  60 tablet    Refill:  0  . tamsulosin (FLOMAX) 0.4 MG CAPS capsule    Sig: Take 1 capsule (0.4 mg total) by mouth daily after breakfast.    Dispense:  90 capsule    Refill:  3  . phentermine (ADIPEX-P) 37.5 MG tablet    Sig: Take 1 tablet (37.5 mg total) by mouth daily before breakfast. Due for weight check prior to refill. Plan for this medication x12 weeks maximum    Dispense:  30 tablet    Refill:  0     Discussed warning signs or symptoms. Please see discharge instructions. Patient expresses  understanding.

## 2017-06-10 LAB — PSA: PSA: 8.8 ng/mL — ABNORMAL HIGH (ref <=4.0)

## 2017-06-23 ENCOUNTER — Encounter: Payer: Self-pay | Admitting: Family Medicine

## 2017-06-23 DIAGNOSIS — E669 Obesity, unspecified: Secondary | ICD-10-CM

## 2017-06-24 MED ORDER — PHENTERMINE HCL 37.5 MG PO TABS: 37.5000 mg | ORAL_TABLET | Freq: Every day | ORAL | 0 refills | Status: DC

## 2017-06-24 MED FILL — PHENTERMINE 37.5 MG TABLET: 37.5 | 30 days supply | Qty: 30 | Fill #0

## 2017-06-30 MED FILL — TESTOSTERONE CYP 200 MG/ML: 200 | 84 days supply | Qty: 6 | Fill #1

## 2017-07-01 MED FILL — BD 3 ML SYRINGE WITH NEEDLE: 23G X 1-1/2 | 90 days supply | Qty: 6 | Fill #1

## 2017-07-01 MED FILL — XIGDUO XR 10 MG-1,000 MG TA: 10-1000 | 90 days supply | Qty: 90 | Fill #1

## 2017-07-01 MED FILL — TRADJENTA 5 MG TABLET: 5 | 90 days supply | Qty: 90 | Fill #1

## 2017-07-01 MED FILL — ALLERGY RELIEF-NASAL DECONG: 10-240 | 30 days supply | Qty: 30 | Fill #1

## 2017-07-05 ENCOUNTER — Encounter: Payer: Self-pay | Admitting: Family Medicine

## 2017-07-05 ENCOUNTER — Other Ambulatory Visit: Payer: Self-pay | Admitting: *Deleted

## 2017-07-05 MED ORDER — "SYRINGE/NEEDLE (DISP) 22G X 1-1/2"" 3 ML MISC": 1 refills | Status: DC

## 2017-07-05 MED ORDER — "NEEDLE (DISP) 18G X 1-1/2"" MISC": 1 refills | Status: DC

## 2017-07-05 MED FILL — SILDENAFIL 20 MG TABLET: 20 | 30 days supply | Qty: 50 | Fill #2

## 2017-07-22 MED FILL — BD 3 ML SYRINGE WITH NEEDLE: 23G X 1-1/2 | 84 days supply | Qty: 6 | Fill #0

## 2017-07-22 MED FILL — AMOXICILLIN 500 MG CAPSULE: 500 | 9 days supply | Qty: 25 | Fill #0

## 2017-07-22 MED FILL — BD NEEDLES 18GX1.5": 18G X 1-1/2 | 84 days supply | Qty: 6 | Fill #0

## 2017-07-22 MED FILL — IBUPROFEN 800 MG TABLET: 800 | 3 days supply | Qty: 15 | Fill #0

## 2017-07-22 MED FILL — BD NEEDLES 18GX1.5: 18G X 1-1/2 | 84 days supply | Qty: 6 | Fill #0

## 2017-07-29 ENCOUNTER — Ambulatory Visit (INDEPENDENT_AMBULATORY_CARE_PROVIDER_SITE_OTHER): Payer: 59 | Admitting: Family Medicine

## 2017-07-29 VITALS — BP 142/74 | HR 95 | Wt 205.0 lb

## 2017-07-29 DIAGNOSIS — Z6833 Body mass index (BMI) 33.0-33.9, adult: Secondary | ICD-10-CM

## 2017-07-29 DIAGNOSIS — E669 Obesity, unspecified: Secondary | ICD-10-CM

## 2017-07-29 MED FILL — glipiZIDE XL 10 MG TB24: 10 | 90 days supply | Qty: 90 | Fill #1

## 2017-07-29 NOTE — Progress Notes (Signed)
Jordan Taylor is a 51 y.o. male who presents to [name]: Primary Care Sports Medicine today for obesity. Patient is here today to follow-up weight loss. He is using phentermine. He's been on it now for several months. He notes it is less effective than it used to be. He feels well otherwise. He is exercising regularly and using a food log.   Past Medical History:  Diagnosis Date  . Essential hypertension 12/04/2013  . Type 2 diabetes mellitus (HCC) 12/04/2013   Past Surgical History:  Procedure Laterality Date  . ANTERIOR CERVICAL DECOMPRESSION/DISCECTOMY FUSION 4 LEVELS N/A 09/07/2016   Procedure: Anterior Cervical Diskectomy Fusion - Cervical three- Cervical four - Cervical four - Cervical five - Cervical five- Cervical six  - Cervical six - Cervical seven;  Surgeon: Julio Sicks, MD;  Location: [facility] OR;  Service: Neurosurgery;  Laterality: N/A;  . KNEE ARTHROSCOPY Left   . spinal cord surgery     20+ yrs ago   Social History  Substance Use Topics  . Smoking status: Former Games developer  . Smokeless tobacco: Never Used  . Alcohol use No   family history includes Alcohol abuse in his father and mother; Cancer in his mother and other; Diabetes in his father; Hypertension in his father.  ROS as above:  Medications: Current Outpatient Prescriptions  Medication Sig Dispense Refill  . amLODipine (NORVASC) 5 MG tablet Take 1 tablet (5 mg total) by mouth daily. 90 tablet 1  . benazepril (LOTENSIN) 20 MG tablet TAKE 1 TABLET (20 MG TOTAL) BY MOUTH DAILY. 90 tablet 1  . cyclobenzaprine (FLEXERIL) 10 MG tablet Take 1 tablet (10 mg total) by mouth 3 (three) times daily as needed for muscle spasms. 30 tablet 0  . Dapagliflozin-Metformin HCl ER 08-999 MG TB24 Take 1 tablet by mouth daily. 90 tablet 1  . glipiZIDE (GLUCOTROL XL) 10 MG 24 hr tablet Take 1 tablet (10 mg total) by mouth daily with breakfast. 90  tablet 1  . linagliptin (TRADJENTA) 5 MG TABS tablet Take 1 tablet (5 mg total) by mouth daily. 90 tablet 1  . loratadine-pseudoephedrine (CLARITIN-D 24 HOUR) 10-240 MG 24 hr tablet Take 1 tablet by mouth daily. 90 tablet 3  . NEEDLE, DISP, 18 G (B-D BLUNT FILL NEEDLE) 18G X 1-1/2" MISC Use to draw up testosterone every 14 days. Dx: E29.1 100 each 1  . phentermine (ADIPEX-P) 37.5 MG tablet Take 1 tablet (37.5 mg total) by mouth daily before breakfast. Due for weight check prior to refill. Plan for this medication x12 weeks maximum 30 tablet 0  . pravastatin (PRAVACHOL) 20 MG tablet Take 1 tablet (20 mg total) by mouth every evening. 90 tablet 1  . sildenafil (REVATIO) 20 MG tablet TAKE 1 TO 4 TABLETS BY MOUTH DAILY AS NEEDED FOR SEX 50 tablet 4  . SYRINGE-NEEDLE, DISP, 3 ML 22G X 1-1/2" 3 ML MISC Use to inject testosterone every 14 days. Dx: E29.1 100 each 1  . tamsulosin (FLOMAX) 0.4 MG CAPS capsule Take 1 capsule (0.4 mg total) by mouth daily after breakfast. 90 capsule 3  . testosterone cypionate (DEPOTESTOSTERONE CYPIONATE) 200 MG/ML injection Inject 1 mL (200 mg total) into the muscle every 14 (fourteen) days. 10 mL 1   No current facility-administered medications for this visit.    No Known Allergies  Health Maintenance Health Maintenance  Topic Date Due  . Hepatitis C Screening  01/13/2018 (Originally Mar 30, 1966)  . INFLUENZA VACCINE  07/29/2018 (Originally  06/30/2017)  . HEMOGLOBIN A1C  10/01/2017  . FOOT EXAM  01/18/2018  . OPHTHALMOLOGY EXAM  03/10/2018  . Fecal DNA (Cologuard)  06/06/2019  . PNEUMOCOCCAL POLYSACCHARIDE VACCINE (2) 09/24/2021  . TETANUS/TDAP  12/10/2024  . HIV Screening  Completed     Exam:  BP (!) 142/74   Pulse 95   Wt 205 lb (93 kg)   SpO2 98%   BMI 33.59 kg/m   Wt Readings from Last 10 Encounters:  07/29/17 205 lb (93 kg)  06/09/17 204 lb (92.5 kg)  05/07/17 199 lb (90.3 kg)  04/14/17 198 lb (89.8 kg)  03/31/17 192 lb (87.1 kg)  03/10/17 197 lb  (89.4 kg)  03/04/17 196 lb (88.9 kg)  02/17/17 195 lb (88.5 kg)  01/28/17 198 lb 1.6 oz (89.9 kg)  01/25/17 195 lb (88.5 kg)    Gen: Well NAD HEENT: EOMI,  MMM Lungs: Normal work of breathing. CTABL Heart: RRR no MRG Abd: NABS, Soft. Nondistended, Nontender Exts: Brisk capillary refill, warm and well perfused.    No results found for this or any previous visit (from the past 72 hour(s)). No results found.    Assessment and Plan: 51 y.o. male with obesity: We'll stop phentermine as weight is increasing. Plan for food log and recheck as needed.   No orders of the defined types were placed in this encounter.  No orders of the defined types were placed in this encounter.    Discussed warning signs or symptoms. Please see discharge instructions. Patient expresses understanding.

## 2017-08-03 ENCOUNTER — Ambulatory Visit: Payer: 59 | Admitting: Osteopathic Medicine

## 2017-08-13 ENCOUNTER — Encounter: Payer: Self-pay | Admitting: Family Medicine

## 2017-08-13 DIAGNOSIS — N410 Acute prostatitis: Secondary | ICD-10-CM

## 2017-08-16 MED ORDER — CIPROFLOXACIN HCL 500 MG PO TABS: 500.0000 mg | ORAL_TABLET | Freq: Two times a day (BID) | ORAL | 0 refills | Status: DC

## 2017-08-16 MED ORDER — TAMSULOSIN HCL 0.4 MG PO CAPS: 0.4000 mg | ORAL_CAPSULE | Freq: Every day | ORAL | 3 refills | Status: DC

## 2017-08-16 MED FILL — CIPROFLOXACIN HCL 500 MG TA: 500 | 10 days supply | Qty: 20 | Fill #0

## 2017-08-20 MED FILL — TAMSULOSIN HCL 0.4 MG CAP: 0.4 | 90 days supply | Qty: 90 | Fill #0

## 2017-08-30 MED FILL — SILDENAFIL 20 MG TABLET: 20 | 30 days supply | Qty: 50 | Fill #3

## 2017-09-15 ENCOUNTER — Telehealth: Payer: Self-pay | Admitting: Osteopathic Medicine

## 2017-09-15 NOTE — Telephone Encounter (Signed)
Patient called stated that in his prevs visit with you Dr.Corey yall talked about him being your patient due to his male issues that he is more comfortable seeing you. I told him I will  send a courtesy email regarding the discussion pcp field still has Alexander. Please adv

## 2017-09-15 NOTE — Telephone Encounter (Signed)
Fine with me if fine w/ Dr Denyse Amassorey

## 2017-09-15 NOTE — Telephone Encounter (Signed)
That's ok.

## 2017-09-16 ENCOUNTER — Ambulatory Visit (INDEPENDENT_AMBULATORY_CARE_PROVIDER_SITE_OTHER): Payer: 59 | Admitting: Family Medicine

## 2017-09-16 ENCOUNTER — Encounter: Payer: Self-pay | Admitting: Family Medicine

## 2017-09-16 VITALS — BP 140/84 | HR 93 | Wt 215.0 lb

## 2017-09-16 DIAGNOSIS — E782 Mixed hyperlipidemia: Secondary | ICD-10-CM

## 2017-09-16 DIAGNOSIS — L739 Follicular disorder, unspecified: Secondary | ICD-10-CM | POA: Diagnosis not present

## 2017-09-16 DIAGNOSIS — Z23 Encounter for immunization: Secondary | ICD-10-CM | POA: Diagnosis not present

## 2017-09-16 DIAGNOSIS — E119 Type 2 diabetes mellitus without complications: Secondary | ICD-10-CM

## 2017-09-16 DIAGNOSIS — I10 Essential (primary) hypertension: Secondary | ICD-10-CM

## 2017-09-16 DIAGNOSIS — E291 Testicular hypofunction: Secondary | ICD-10-CM

## 2017-09-16 LAB — POCT GLYCOSYLATED HEMOGLOBIN (HGB A1C): Hemoglobin A1C: 6.3

## 2017-09-16 MED ORDER — DOXYCYCLINE HYCLATE 100 MG PO TABS: 100.0000 mg | ORAL_TABLET | Freq: Two times a day (BID) | ORAL | 0 refills | Status: DC

## 2017-09-16 MED FILL — DOXYCYCLINE HYCLATE 100 MG: 100 | 7 days supply | Qty: 14 | Fill #0

## 2017-09-16 NOTE — Patient Instructions (Addendum)
Thank you for coming in today. Take the doxycycline twice daily for 1 week.  Use over the counter Hibiclens a few times a week in the shower.  You may also have Pitaryias Rosea  Get labs today.   Recheck in 3 months.   Return sooner if needed.  We will get records from [facility].

## 2017-09-16 NOTE — Progress Notes (Signed)
Jordan Taylor is a 51 y.o. male who [name] today for diabetes, skin rash, testosterone, HLD, elevated PSA.   Diabetes: [name] takes Xigduo, Glipizide, and linagliptin. He tolerates his medications well and notes that his blood sugar typically is in the 120s to 130s. He denies any polyuria polydipsia hypoglycemic episodes.  Rash: Patient notes a rash on his back. He notes areas of "pimples" and areas of skin hyperpigmentation across his back. He notes that it is not itchy or painful. He denies any fevers or chills nausea vomiting or diarrhea.  Testosterone: [name] takes testosterone cypionate every 2 weeks. He notes he is having some trouble drawing the testosterone up into a syringe. He is using a larger needle to draw than to inject. He feels as though the testosterone does help to control symptoms.  Hyperlipidemia: Patient takes pravastatin daily. He denies significant muscle aches or pains.  Elevated PSA: Patient had a history of elevated PSA when having chronic prostatitis. He has not had his PSA rechecked in some time. He has been seen by urology however who recommended intermittent treatment.    Past Medical History:  Diagnosis Date  . Essential hypertension 12/04/2013  . Type 2 diabetes mellitus (HCC) 12/04/2013   Past Surgical History:  Procedure Laterality Date  . ANTERIOR CERVICAL DECOMPRESSION/DISCECTOMY FUSION 4 LEVELS N/A 09/07/2016   Procedure: Anterior Cervical Diskectomy Fusion - Cervical three- Cervical four - Cervical four - Cervical five - Cervical five- Cervical six  - Cervical six - Cervical seven;  Surgeon: Julio SicksHenry Pool, MD;  [facility];  Service: Neurosurgery;  Laterality: N/A;  . KNEE ARTHROSCOPY Left   . spinal cord surgery     20+ yrs ago   Social History  Substance Use Topics  . Smoking status: Former Games developermoker  .  Smokeless tobacco: Never Used  . Alcohol use No   family history includes Alcohol abuse in his father and mother; Cancer in his mother and other; Diabetes in his father; Hypertension in his father.  ROS as above:  Medications: Current Outpatient Prescriptions  Medication Sig Dispense Refill  . amLODipine (NORVASC) 5 MG tablet Take 1 tablet (5 mg total) by mouth daily. 90 tablet 1  . benazepril (LOTENSIN) 20 MG tablet TAKE 1 TABLET (20 MG TOTAL) BY MOUTH DAILY. 90 tablet 1  . Dapagliflozin-Metformin HCl ER 08-999 MG TB24 Take 1 tablet by mouth daily. 90 tablet 1  . glipiZIDE (GLUCOTROL XL) 10 MG 24 hr tablet Take 1 tablet (10 mg total) by mouth daily with breakfast. 90 tablet 1  . linagliptin (TRADJENTA) 5 MG TABS tablet Take 1 tablet (5 mg total) by mouth daily. 90 tablet 1  . loratadine-pseudoephedrine (CLARITIN-D 24 HOUR) 10-240 MG 24 hr tablet Take 1 tablet by mouth daily. 90 tablet 3  . NEEDLE, DISP, 18 G (B-D BLUNT FILL NEEDLE) 18G X 1-1/2" MISC Use to draw up testosterone every 14 days. Dx: E29.1 100 each 1  . pravastatin (PRAVACHOL) 20 MG tablet Take 1 tablet (20 mg total) by mouth every evening. 90 tablet 1  . sildenafil (REVATIO) 20 MG tablet TAKE 1 TO 4 TABLETS BY MOUTH DAILY AS NEEDED FOR SEX 50 tablet 4  . SYRINGE-NEEDLE, DISP, 3 ML 22G X 1-1/2" 3 ML MISC Use to inject testosterone every 14 days. Dx: E29.1 100 each 1  . tamsulosin (FLOMAX) 0.4 MG CAPS capsule Take 1 capsule (0.4 mg total) by mouth daily after  breakfast. 90 capsule 3  . testosterone cypionate (DEPOTESTOSTERONE CYPIONATE) 200 MG/ML injection Inject 1 mL (200 mg total) into the muscle every 14 (fourteen) days. 10 mL 1  . doxycycline (VIBRA-TABS) 100 MG tablet Take 1 tablet (100 mg total) by mouth 2 (two) times daily. 14 tablet 0   No current facility-administered medications for this visit.    No Known Allergies  Health Maintenance Health Maintenance  Topic Date Due  . Hepatitis C Screening  01/13/2018  (Originally July 24, 1966)  . INFLUENZA VACCINE  07/29/2018 (Originally 06/30/2017)  . HEMOGLOBIN A1C  10/01/2017  . FOOT EXAM  01/18/2018  . OPHTHALMOLOGY EXAM  03/10/2018  . Fecal DNA (Cologuard)  06/06/2019  . PNEUMOCOCCAL POLYSACCHARIDE VACCINE (2) 09/24/2021  . TETANUS/TDAP  12/10/2024  . HIV Screening  Completed     Exam:  BP 140/84   Pulse 93   Wt 215 lb (97.5 kg)   BMI 35.23 kg/m  Gen: Well NAD HEENT: EOMI,  MMM Lungs: Normal work of breathing. CTABL Heart: RRR no MRG Abd: NABS, Soft. Nondistended, Nontender Exts: Brisk capillary refill, warm and well perfused.  Skin: Macular hyperpigmented ovals  on back with Christmas tree pattern consistent with pityriasis rosea. Additionally patient has areas of pustules consistent with folliculitis on back as well.   Results for orders placed or performed in visit on 09/16/17 (from the past 72 hour(s))  POCT HgB A1C     Status: None   Collection Time: 09/16/17  8:39 AM  Result Value Ref Range   Hemoglobin A1C 6.3    No results found.    Assessment and Plan: 51 y.o. male with  Diabetes: Well controlled. Plan to continue current regimen and recheck labs in about 3 months.  Rash: I think is due to issues. I think patient has pityriasis rosea and folliculitis. He is not symptomatic from pityriasis rosea therefore no treatment is required. However the cellulitis doesn't seem to be somewhat symptomatic. Plan to treat with a course of oral doxycycline as well as using chlorhexidine body wash regularly.  Testosterone: Plan to recheck fasting labs as well as testosterone level in the near future.  Lipids: We'll recheck lipids with fasting labs.  Elevated PSA: Recheck PSA.  Influenza vaccine given today.   Orders Placed This Encounter  Procedures  . Flu Vaccine QUAD 36+ mos IM  . CBC  . COMPLETE METABOLIC PANEL WITH GFR  . Lipid Panel w/reflex Direct LDL  . PSA  . Testosterone  . POCT HgB A1C   Meds ordered this encounter    Medications  . doxycycline (VIBRA-TABS) 100 MG tablet    Sig: Take 1 tablet (100 mg total) by mouth 2 (two) times daily.    Dispense:  14 tablet    Refill:  0     Discussed warning signs or symptoms. Please see discharge instructions. Patient expresses understanding.

## 2017-09-17 LAB — COMPLETE METABOLIC PANEL WITH GFR
AG Ratio: 1.5 (calc) (ref 1.0–2.5)
ALT: 57 U/L — ABNORMAL HIGH (ref 9–46)
AST: 31 U/L (ref 10–35)
Albumin: 3.9 g/dL (ref 3.6–5.1)
Alkaline phosphatase (APISO): 92 U/L (ref 40–115)
BUN: 16 mg/dL (ref 7–25)
CO2: 23 mmol/L (ref 20–32)
Calcium: 8.7 mg/dL (ref 8.6–10.3)
Chloride: 108 mmol/L (ref 98–110)
Creat: 1.02 mg/dL (ref 0.70–1.33)
GFR, Est African American: 97 mL/min/{1.73_m2} (ref >=60)
GFR, Est Non African American: 84 mL/min/{1.73_m2} (ref >=60)
Globulin: 2.6 g/dL (calc) (ref 1.9–3.7)
Glucose, Bld: 116 mg/dL — ABNORMAL HIGH (ref 65–99)
Potassium: 4.3 mmol/L (ref 3.5–5.3)
Sodium: 138 mmol/L (ref 135–146)
Total Bilirubin: 0.4 mg/dL (ref 0.2–1.2)
Total Protein: 6.5 g/dL (ref 6.1–8.1)

## 2017-09-17 LAB — CBC
HCT: 45.2 % (ref 38.5–50.0)
Hemoglobin: 15.9 g/dL (ref 13.2–17.1)
MCH: 29.4 pg (ref 27.0–33.0)
MCHC: 35.2 g/dL (ref 32.0–36.0)
MCV: 83.5 fL (ref 80.0–100.0)
MPV: 10 fL (ref 7.5–12.5)
Platelets: 262 10*3/uL (ref 140–400)
RBC: 5.41 Million/uL (ref 4.20–5.80)
RDW: 15.6 % — ABNORMAL HIGH (ref 11.0–15.0)
WBC: 10.1 10*3/uL (ref 3.8–10.8)

## 2017-09-17 LAB — LIPID PANEL W/REFLEX DIRECT LDL
Cholesterol: 173 mg/dL (ref <200)
HDL: 39 mg/dL — ABNORMAL LOW (ref >40)
LDL Cholesterol (Calc): 111 mg/dL (calc) — ABNORMAL HIGH
Non-HDL Cholesterol (Calc): 134 mg/dL (calc) — ABNORMAL HIGH (ref <130)
Total CHOL/HDL Ratio: 4.4 (calc) (ref <5.0)
Triglycerides: 121 mg/dL (ref <150)

## 2017-09-17 LAB — PSA: PSA: 5.6 ng/mL — ABNORMAL HIGH

## 2017-09-17 LAB — TESTOSTERONE: Testosterone: 841 ng/dL — ABNORMAL HIGH (ref 250–827)

## 2017-09-17 MED ORDER — PRAVASTATIN SODIUM 80 MG PO TABS: 80.0000 mg | ORAL_TABLET | Freq: Every evening | ORAL | 1 refills | Status: DC

## 2017-09-17 MED FILL — PRAVASTATIN SODIUM 80 MG TA: 80 | 90 days supply | Qty: 90 | Fill #0

## 2017-09-17 MED FILL — CLINDAMYCIN-BENZOYL PEROX 1: 1-5 | 30 days supply | Qty: 25 | Fill #2

## 2017-09-17 NOTE — Addendum Note (Signed)
Addended by: Rodolph BongOREY, EVAN S on: 09/17/2017 07:41 AM   Modules accepted: Orders

## 2017-09-30 MED FILL — SILDENAFIL 20 MG TABLET: 20 | 30 days supply | Qty: 50 | Fill #4

## 2017-10-05 ENCOUNTER — Encounter: Payer: Self-pay | Admitting: Family Medicine

## 2017-10-06 ENCOUNTER — Other Ambulatory Visit: Payer: Self-pay | Admitting: Osteopathic Medicine

## 2017-10-06 MED ORDER — BENAZEPRIL HCL 20 MG PO TABS: ORAL_TABLET | ORAL | 1 refills | Status: DC

## 2017-10-06 MED FILL — BENAZEPRIL HCL 20 MG TABLET: 20 | 90 days supply | Qty: 90 | Fill #0

## 2017-11-01 ENCOUNTER — Other Ambulatory Visit: Payer: Self-pay | Admitting: Osteopathic Medicine

## 2017-11-01 DIAGNOSIS — L700 Acne vulgaris: Secondary | ICD-10-CM

## 2017-11-01 MED FILL — TRADJENTA 5 MG TABLET: 5 | 90 days supply | Qty: 90 | Fill #0

## 2017-11-01 MED FILL — CLINDAMYCIN PHOS-BENZOYL PE: 1-5 | 30 days supply | Qty: 25 | Fill #0

## 2017-11-04 ENCOUNTER — Other Ambulatory Visit: Payer: Self-pay | Admitting: *Deleted

## 2017-11-04 NOTE — Patient Outreach (Signed)
Jordan Taylor transitioned from the Jordan Taylor to the Jordan Taylor digital assistant platform on 12/16/16 for Type II diabetes self-management assistance so will close case to the diabetes [facility] due to delegation of disease management services to Jordan Taylor from [employer] for [employer] plan members in 2019. Jordan RichardJanet S. Hauser RN,CCM,CDE [facility] Care Management Coordinator [facility] Office Phone [phone] Office Fax [phone]

## 2017-11-09 ENCOUNTER — Other Ambulatory Visit: Payer: Self-pay | Admitting: Osteopathic Medicine

## 2017-11-09 DIAGNOSIS — E119 Type 2 diabetes mellitus without complications: Secondary | ICD-10-CM

## 2017-11-09 MED FILL — XIGDUO XR 10 MG-1,000 MG TA: 10-1000 | 90 days supply | Qty: 90 | Fill #0

## 2017-11-09 MED FILL — glipiZIDE ER 10 MG TB24: 10 | 90 days supply | Qty: 90 | Fill #0

## 2017-12-09 MED FILL — TAMSULOSIN HCL 0.4 MG CAPS: 0.4 | 30 days supply | Qty: 30 | Fill #1

## 2017-12-16 ENCOUNTER — Ambulatory Visit (INDEPENDENT_AMBULATORY_CARE_PROVIDER_SITE_OTHER): Payer: BLUE CROSS/BLUE SHIELD | Admitting: Family Medicine

## 2017-12-16 ENCOUNTER — Encounter: Payer: Self-pay | Admitting: Family Medicine

## 2017-12-16 VITALS — BP 143/98 | HR 83 | Ht 66.25 in | Wt 217.0 lb

## 2017-12-16 DIAGNOSIS — E119 Type 2 diabetes mellitus without complications: Secondary | ICD-10-CM

## 2017-12-16 DIAGNOSIS — R972 Elevated prostate specific antigen [PSA]: Secondary | ICD-10-CM

## 2017-12-16 DIAGNOSIS — E782 Mixed hyperlipidemia: Secondary | ICD-10-CM

## 2017-12-16 DIAGNOSIS — I10 Essential (primary) hypertension: Secondary | ICD-10-CM

## 2017-12-16 DIAGNOSIS — E291 Testicular hypofunction: Secondary | ICD-10-CM

## 2017-12-16 LAB — POCT GLYCOSYLATED HEMOGLOBIN (HGB A1C): Hemoglobin A1C: 8.2

## 2017-12-16 LAB — PSA: PSA: 4.7 ng/mL — ABNORMAL HIGH (ref <=4.0)

## 2017-12-16 MED ORDER — PRAVASTATIN SODIUM 80 MG PO TABS: 80.0000 mg | ORAL_TABLET | Freq: Every evening | ORAL | 1 refills | Status: DC

## 2017-12-16 MED ORDER — AMLODIPINE BESYLATE 10 MG PO TABS: 10.0000 mg | ORAL_TABLET | Freq: Every day | ORAL | 1 refills | Status: DC

## 2017-12-16 MED FILL — AMLODIPINE BESYLATE 10 MG T: 10 | 30 days supply | Qty: 30 | Fill #0

## 2017-12-16 NOTE — Progress Notes (Signed)
Gee Oblinger is a 52 y.o. male who presents to [name]: Primary Care Sports Medicine today for follow-up HSV test, discuss diabetes, discuss hypertension, and discuss obesity.  Mr. Mainwaring has had a tough time in the last several months.  He and his wife are currently in the process of getting a separation.  The central problem is a positive HSV antibody test that she initially had and subsequently he also had about a half a year ago.  No one has had an outbreak to either knowledge but there is concern over infidelity.  Mr. Viglione has been tested multiple times and never had any STD tests that were positive.  Hypertension: Mr. Gratz notes that he is gained weight because his diet worsened over the past several months due to stress.  He currently takes amlodipine 5 mg and benazepril 20 mg daily.  He denies chest pain palpitations or shortness of breath.  Diabetes: Mr. Rhine continues to take medications listed below for diabetes.  He notes he has been drinking about 12 cans of sugar sweetened soda a day over the last several months due to increased stress.  He notes that he is gained over 10 pounds and thinks the reason his diabetes and obesity has worsened his because of his consumption of sugar sweetened beverages.  He denies any polyuria polydipsia hyperglycemic episodes or hypoglycemic episodes.  Hyperlipidemia: Mr. Halderman forgot to refill his pravastatin and is not currently taking it.  He tolerated it well.  Obesity: As noted above he has gained weight over the last several months due to increased calorie intake.  He would like to try to lose weight and has done well with phentermine in the past.   Past Medical History:  Diagnosis Date  . Essential hypertension 12/04/2013  . Type 2 diabetes mellitus (HCC) 12/04/2013   Past Surgical History:  Procedure Laterality Date  . ANTERIOR  CERVICAL DECOMPRESSION/DISCECTOMY FUSION 4 LEVELS N/A 09/07/2016   Procedure: Anterior Cervical Diskectomy Fusion - Cervical three- Cervical four - Cervical four - Cervical five - Cervical five- Cervical six  - Cervical six - Cervical seven;  Surgeon: Julio Sicks, MD;  Location: [facility] OR;  Service: Neurosurgery;  Laterality: N/A;  . KNEE ARTHROSCOPY Left   . spinal cord surgery     20+ yrs ago   Social History   Tobacco Use  . Smoking status: Former Games developer  . Smokeless tobacco: Never Used  Substance Use Topics  . Alcohol use: No    Alcohol/week: 0.0 oz   family history includes Alcohol abuse in his father and mother; Cancer in his mother and other; Diabetes in his father; Hypertension in his father.  ROS as above:  Medications: Current Outpatient Medications  Medication Sig Dispense Refill  . amLODipine (NORVASC) 10 MG tablet Take 1 tablet (10 mg total) by mouth daily. 90 tablet 1  . benazepril (LOTENSIN) 20 MG tablet TAKE 1 TABLET (20 MG TOTAL) BY MOUTH DAILY. 90 tablet 1  . clindamycin-benzoyl peroxide (BENZACLIN) gel APPLY TO THE AFFECTED AREA TOPICALLY TWICE DAILY 25 g 3  . GLIPIZIDE XL 10 MG 24 hr tablet TAKE 1 TABLET (10 MG TOTAL) BY MOUTH DAILY WITH BREAKFAST. 90 tablet 1  . loratadine-pseudoephedrine (CLARITIN-D 24 HOUR) 10-240 MG 24 hr tablet Take 1 tablet by mouth daily. 90 tablet 3  . NEEDLE, DISP, 18 G (B-D BLUNT FILL NEEDLE) 18G X 1-1/2" MISC Use to draw up testosterone every 14 days. Dx: E29.1 100  each 1  . pravastatin (PRAVACHOL) 80 MG tablet Take 1 tablet (80 mg total) by mouth every evening. 90 tablet 1  . sildenafil (REVATIO) 20 MG tablet TAKE 1 TO 4 TABLETS BY MOUTH DAILY AS NEEDED FOR SEX 50 tablet 4  . SYRINGE-NEEDLE, DISP, 3 ML 22G X 1-1/2" 3 ML MISC Use to inject testosterone every 14 days. Dx: E29.1 100 each 1  . tamsulosin (FLOMAX) 0.4 MG CAPS capsule Take 1 capsule (0.4 mg total) by mouth daily after breakfast. 90 capsule 3  . testosterone cypionate  (DEPOTESTOSTERONE CYPIONATE) 200 MG/ML injection Inject 1 mL (200 mg total) into the muscle every 14 (fourteen) days. 10 mL 1  . TRADJENTA 5 MG TABS tablet TAKE 1 TABLET (5 MG TOTAL) BY MOUTH DAILY. 90 tablet 1  . XIGDUO XR 08-999 MG TB24 TAKE 1 TABLET BY MOUTH DAILY. 90 tablet 1   No current facility-administered medications for this visit.    No Known Allergies  Health Maintenance Health Maintenance  Topic Date Due  . Hepatitis C Screening  01/13/2018 (Originally 29-Sep-1966)  . OPHTHALMOLOGY EXAM  03/10/2018  . HEMOGLOBIN A1C  03/17/2018  . FOOT EXAM  12/16/2018  . Fecal DNA (Cologuard)  06/06/2019  . PNEUMOCOCCAL POLYSACCHARIDE VACCINE (2) 09/24/2021  . TETANUS/TDAP  12/10/2024  . INFLUENZA VACCINE  Completed  . HIV Screening  Completed     Exam:  BP (!) 143/98   Pulse 83   Ht 5' 6.25" (1.683 m)   Wt 217 lb (98.4 kg)   SpO2 100%   BMI 34.76 kg/m  Gen: Well NAD HEENT: EOMI,  MMM Lungs: Normal work of breathing. CTABL Heart: RRR no MRG Abd: NABS, Soft. Nondistended, Nontender Exts: Brisk capillary refill, warm and well perfused.     Results for orders placed or performed in visit on 12/16/17 (from the past 72 hour(s))  POCT HgB A1C     Status: Abnormal   Collection Time: 12/16/17  9:22 AM  Result Value Ref Range   Hemoglobin A1C 8.2    No results found.    Assessment and Plan: 52 y.o. 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  Cessation with Mr. Roxan HockeyRobinson about the usefulness of this test.  I stated that this test cannot tell us when he contracted HSV-1 or if his wife has not been faithful.  He is relieved and I provided a letter to his wife with the information about this test.  We will have further discussions as needed.  Offered a family visit to go over everyone's lab results and help interpret them.  Diabetes: Not well controlled today.  This is associated with dramatically increased sugar consumption and weight gain.  He previously was pretty well controlled  with an A1c at 6.3.  He agrees to discontinue his sugar sweetened sodas and I think his weight and A1c will normalize.  Recheck in about a month.  Hypertension: Blood pressure not controlled today.  I think this is also due to increased weight gain.  Plan to change diet as noted above and increase amlodipine from 5 mg to 10 mg.  Recheck in 1 month.  Obesity: As noted above cause and plan  History of elevated PSA: Due for recheck  Low testosterone: Not currently on treatment.  Will recheck PSA and resume testosterone as needed in the future.  Orders Placed This Encounter  Procedures  . PSA  . POCT HgB A1C   Meds ordered this encounter  Medications  . pravastatin (PRAVACHOL) 80  MG tablet    Sig: Take 1 tablet (80 mg total) by mouth every evening.    Dispense:  90 tablet    Refill:  1  . amLODipine (NORVASC) 10 MG tablet    Sig: Take 1 tablet (10 mg total) by mouth daily.    Dispense:  90 tablet    Refill:  1     Discussed warning signs or symptoms. Please see discharge instructions. Patient expresses understanding.  I spent 40 minutes with this patient, greater than 50% was face-to-face time counseling regarding ddx and treatment plan.

## 2017-12-16 NOTE — Patient Instructions (Addendum)
Thank you for coming in today.  Restart pravastatin.   Get PSA lab  Increase amlodipine to 10mg  daily.    Recheck in 1 month.  Return sooner if  Needed.   Let me know if you have any further herpes questions.

## 2018-01-07 ENCOUNTER — Encounter: Payer: Self-pay | Admitting: Family Medicine

## 2018-01-07 ENCOUNTER — Other Ambulatory Visit: Payer: Self-pay | Admitting: Osteopathic Medicine

## 2018-01-07 DIAGNOSIS — N529 Male erectile dysfunction, unspecified: Secondary | ICD-10-CM

## 2018-01-07 MED ORDER — CEFDINIR 300 MG PO CAPS: 300.0000 mg | ORAL_CAPSULE | Freq: Two times a day (BID) | ORAL | 0 refills | Status: DC

## 2018-01-07 MED FILL — CEFDINIR 300 MG CAPSULE: 300 | 7 days supply | Qty: 14 | Fill #0

## 2018-01-07 MED FILL — BENAZEPRIL HCL 20 MG TABLET: 20 | 30 days supply | Qty: 30 | Fill #1

## 2018-01-07 NOTE — Telephone Encounter (Signed)
[facility] pharmacy requesting refills for medication.

## 2018-01-10 ENCOUNTER — Encounter: Payer: Self-pay | Admitting: Family Medicine

## 2018-01-11 ENCOUNTER — Encounter: Payer: Self-pay | Admitting: Family Medicine

## 2018-01-11 MED FILL — TAMSULOSIN HCL 0.4 MG CAP: 0.4 | 30 days supply | Qty: 30 | Fill #2

## 2018-01-11 MED FILL — AMLODIPINE BESYLATE 10 MG T: 10 | 30 days supply | Qty: 30 | Fill #1

## 2018-01-12 ENCOUNTER — Telehealth: Payer: Self-pay | Admitting: Family Medicine

## 2018-01-12 MED ORDER — AZITHROMYCIN 250 MG PO TABS: 250.0000 mg | ORAL_TABLET | Freq: Every day | ORAL | 0 refills | Status: DC

## 2018-01-12 MED FILL — AZITHROMYCIN 250 MG TABLET: 250 | 5 days supply | Qty: 6 | Fill #0

## 2018-01-12 NOTE — Telephone Encounter (Signed)
PA submitted via covermymeds. Awaiting determination

## 2018-01-19 ENCOUNTER — Encounter: Payer: Self-pay | Admitting: Family Medicine

## 2018-01-19 ENCOUNTER — Telehealth: Payer: Self-pay | Admitting: Family Medicine

## 2018-01-19 ENCOUNTER — Ambulatory Visit (INDEPENDENT_AMBULATORY_CARE_PROVIDER_SITE_OTHER): Payer: BLUE CROSS/BLUE SHIELD | Admitting: Family Medicine

## 2018-01-19 VITALS — BP 139/89 | HR 98 | Ht 65.0 in | Wt 215.0 lb

## 2018-01-19 DIAGNOSIS — E119 Type 2 diabetes mellitus without complications: Secondary | ICD-10-CM

## 2018-01-19 DIAGNOSIS — I10 Essential (primary) hypertension: Secondary | ICD-10-CM | POA: Diagnosis not present

## 2018-01-19 DIAGNOSIS — N529 Male erectile dysfunction, unspecified: Secondary | ICD-10-CM | POA: Diagnosis not present

## 2018-01-19 DIAGNOSIS — E291 Testicular hypofunction: Secondary | ICD-10-CM

## 2018-01-19 MED ORDER — PHENTERMINE HCL 37.5 MG PO CAPS: ORAL_CAPSULE | ORAL | 0 refills | Status: DC

## 2018-01-19 MED ORDER — TESTOSTERONE CYPIONATE 200 MG/ML IM SOLN: 200.0000 mg | INTRAMUSCULAR | 1 refills | Status: DC

## 2018-01-19 MED ORDER — SILDENAFIL CITRATE 100 MG PO TABS: 50.0000 mg | ORAL_TABLET | Freq: Every day | ORAL | 11 refills | Status: DC | PRN

## 2018-01-19 MED ORDER — AMBULATORY NON FORMULARY MEDICATION
0 refills | Status: DC
Start: 2018-01-19 — End: 2020-04-03

## 2018-01-19 MED FILL — TESTOSTERONE CYP 200 MG/ML: 200 | 28 days supply | Qty: 2 | Fill #0

## 2018-01-19 MED FILL — SILDENAFIL CITRATE 100 MG T: 100 | 11 days supply | Qty: 3 | Fill #0

## 2018-01-19 MED FILL — PHENTERMINE 37.5 MG CAPSULE: 37.5 | 30 days supply | Qty: 30 | Fill #0

## 2018-01-19 NOTE — Patient Instructions (Addendum)
Thank you for coming in today. Continue the avoid sugar in drinks.  Continue medicines.  Redstart phentermine.  Recheck nurse visit in 1 month blood pressure and weight.  Follow up with me after April 17 for diabetes.  We will also check cholesterol, iron, testosterone and prostate labs.

## 2018-01-19 NOTE — Progress Notes (Signed)
Jordan Taylor is a 52 y.o. male who presents to [facility]: Primary Care Sports Medicine today for follow up diabetes, HTN, testosterone and obesity.  Diabetes: Jordan Taylor was found to have poorly controlled diabetes at the last visit partially due to trouble getting medicine or confusion about what he was taking as well as an increase in sugar sweetened beverages.  Over the last month he has dramatically decreased his sugar sweetened beverage intake.  He takes all of the medicines listed below and feels pretty well.  He notes his fasting blood sugars are typically in the 150s.  He denies polyuria or polydipsia.  Hypertension: At the last check Jordan. Taylor did not have well-controlled hypertension because he was not taking 1 of his medicines.  He is taking both blood pressure medications amlodipine and benazepril listed below.  He denies chest pain palpitations or shortness of breath.  He notes his home blood pressures are typically in the 120s or low 130s systolic.  Testosterone: Jordan. Taylor takes injectable testosterone.  He notes he feels better on it.  He is due for lab recheck in about 2 months.  He feels well currently.  Obesity: Jordan. Taylor has a history of obesity.  He is interested in a trial of oral phentermine.  He is done well with this in the past and would like a repeat trial for the next several months.   Past Medical History:  Diagnosis Date  . Essential hypertension 12/04/2013  . Type 2 diabetes mellitus (HCC) 12/04/2013   Past Surgical History:  Procedure Laterality Date  . ANTERIOR CERVICAL DECOMPRESSION/DISCECTOMY FUSION 4 LEVELS N/A 09/07/2016   Procedure: Anterior Cervical Diskectomy Fusion - Cervical three- Cervical four - Cervical four - Cervical five - Cervical five- Cervical six  - Cervical six - Cervical seven;  Surgeon: Julio Sicks, MD;  Location: [facility] OR;  Service:  Neurosurgery;  Laterality: N/A;  . KNEE ARTHROSCOPY Left   . spinal cord surgery     20+ yrs ago   Social History   Tobacco Use  . Smoking status: Former Games developer  . Smokeless tobacco: Never Used  Substance Use Topics  . Alcohol use: No    Alcohol/week: 0.0 oz   family history includes Alcohol abuse in his father and mother; Cancer in his mother and other; Diabetes in his father; Hypertension in his father.  ROS as above:  Medications: Current Outpatient Medications  Medication Sig Dispense Refill  . amLODipine (NORVASC) 10 MG tablet Take 1 tablet (10 mg total) by mouth daily. 90 tablet 1  . benazepril (LOTENSIN) 20 MG tablet TAKE 1 TABLET (20 MG TOTAL) BY MOUTH DAILY. 90 tablet 1  . clindamycin-benzoyl peroxide (BENZACLIN) gel APPLY TO THE AFFECTED AREA TOPICALLY TWICE DAILY 25 g 3  . GLIPIZIDE XL 10 MG 24 hr tablet TAKE 1 TABLET (10 MG TOTAL) BY MOUTH DAILY WITH BREAKFAST. 90 tablet 1  . loratadine-pseudoephedrine (CLARITIN-D 24 HOUR) 10-240 MG 24 hr tablet Take 1 tablet by mouth daily. 90 tablet 3  . pravastatin (PRAVACHOL) 80 MG tablet Take 1 tablet (80 mg total) by mouth every evening. 90 tablet 1  . tamsulosin (FLOMAX) 0.4 MG CAPS capsule Take 1 capsule (0.4 mg total) by mouth daily after breakfast. 90 capsule 3  . testosterone cypionate (DEPOTESTOSTERONE CYPIONATE) 200 MG/ML injection Inject 1 mL (200 mg total) into the muscle every 14 (fourteen) days. 10 mL 1  . TRADJENTA 5 MG TABS tablet TAKE 1 TABLET (  5 MG TOTAL) BY MOUTH DAILY. 90 tablet 1  . XIGDUO XR 08-999 MG TB24 TAKE 1 TABLET BY MOUTH DAILY. 90 tablet 1  . AMBULATORY NON FORMULARY MEDICATION 3ml syringe, 21g 1.5 inch needles and 18 gauge blunt filler needles for testosterone injections every 2 weeks. Disp qs x 3 months. 1 each 0  . phentermine 37.5 MG capsule One capsule by mouth qAM 30 capsule 0  . sildenafil (VIAGRA) 100 MG tablet Take 0.5-1 tablets (50-100 mg total) by mouth daily as needed for erectile dysfunction.  10 tablet 11   No current facility-administered medications for this visit.    No Known Allergies  Health Maintenance Health Maintenance  Topic Date Due  . Hepatitis C Screening  03-Apr-1966  . OPHTHALMOLOGY EXAM  03/10/2018  . HEMOGLOBIN A1C  06/15/2018  . FOOT EXAM  12/16/2018  . Fecal DNA (Cologuard)  06/06/2019  . PNEUMOCOCCAL POLYSACCHARIDE VACCINE (2) 09/24/2021  . TETANUS/TDAP  12/10/2024  . INFLUENZA VACCINE  Completed  . HIV Screening  Completed     Exam:  BP 139/89   Pulse 98   Ht 5\' 5"  (1.651 m)   Wt 215 lb (97.5 kg)   BMI 35.78 kg/m   Wt Readings from Last 5 Encounters:  01/19/18 215 lb (97.5 kg)  12/16/17 217 lb (98.4 kg)  09/16/17 215 lb (97.5 kg)  07/29/17 205 lb (93 kg)  06/09/17 204 lb (92.5 kg)    Gen: Well NAD HEENT: EOMI,  MMM Lungs: Normal work of breathing. CTABL Heart: RRR no MRG Abd: NABS, Soft. Nondistended, Nontender Exts: Brisk capillary refill, warm and well perfused.    No results found for this or any previous visit (from the past 72 hour(s)). No results found.    Assessment and Plan: 52 y.o. male with  Diabetes: Improved continue current regimen.  Continue to follow lower carbohydrate diet.  Recheck with me after April 17 for a total 3194-month check.  Hypertension improved.  Continue current regimen.  Recheck nurse visit 1 month.  Testosterone: Improved.  Testosterone refilled.  Recheck labs in April.  Erectile dysfunction: Viagra refilled.  Obesity: Repeat trial of phentermine.  Emphasized the importance of continued dietary vigilance.  Nurse visit 1 month follow-up with me in April or sooner if needed.  Discussed risks and benefits of phentermine.   No orders of the defined types were placed in this encounter.  Meds ordered this encounter  Medications  . phentermine 37.5 MG capsule    Sig: One capsule by mouth qAM    Dispense:  30 capsule    Refill:  0  . testosterone cypionate (DEPOTESTOSTERONE CYPIONATE) 200 MG/ML  injection    Sig: Inject 1 mL (200 mg total) into the muscle every 14 (fourteen) days.    Dispense:  10 mL    Refill:  1  . AMBULATORY NON FORMULARY MEDICATION    Sig: 3ml syringe, 21g 1.5 inch needles and 18 gauge blunt filler needles for testosterone injections every 2 weeks. Disp qs x 3 months.    Dispense:  1 each    Refill:  0  . sildenafil (VIAGRA) 100 MG tablet    Sig: Take 0.5-1 tablets (50-100 mg total) by mouth daily as needed for erectile dysfunction.    Dispense:  10 tablet    Refill:  11     Discussed warning signs or symptoms. Please see discharge instructions. Patient expresses understanding.

## 2018-01-19 NOTE — Telephone Encounter (Signed)
[facility] Testosterone approval from 01/19/2018 until 01/19/2019.   Reference Number: [id number]

## 2018-01-20 MED FILL — BD NEEDLES 18GX1.5: 18G X 1-1/2 | 84 days supply | Qty: 6 | Fill #1

## 2018-01-20 MED FILL — BD 3 ML SYRINGE WITH NEEDLE: 23G X 1-1/2 | 84 days supply | Qty: 6 | Fill #1

## 2018-01-20 MED FILL — BD NEEDLES 18GX1.5": 18G X 1-1/2 | 84 days supply | Qty: 6 | Fill #1

## 2018-01-25 ENCOUNTER — Encounter: Payer: Self-pay | Admitting: Family Medicine

## 2018-02-08 ENCOUNTER — Telehealth: Payer: Self-pay | Admitting: Family Medicine

## 2018-02-08 MED FILL — TRADJENTA 5 MG TABLET: 5 | 30 days supply | Qty: 30 | Fill #1

## 2018-02-08 MED FILL — AMLODIPINE BESYLATE 10 MG T: 10 | 30 days supply | Qty: 30 | Fill #2

## 2018-02-08 MED FILL — BENAZEPRIL HCL 20 MG TABLET: 20 | 30 days supply | Qty: 30 | Fill #2

## 2018-02-08 MED FILL — TAMSULOSIN HCL 0.4 MG CAP: 0.4 | 30 days supply | Qty: 30 | Fill #3

## 2018-02-08 MED FILL — glipiZIDE ER 10 MG TB24: 10 | 30 days supply | Qty: 30 | Fill #1

## 2018-02-08 NOTE — Telephone Encounter (Signed)
Approvedtoday  Questionnaire submitted. PA Case [id number] Status: Approved. Authorization and Notifications Completed.

## 2018-02-08 NOTE — Telephone Encounter (Signed)
Questionnaire submitted. PA Case [id number] Status: Approved. Authorization and Notifications Completed.

## 2018-02-08 NOTE — Telephone Encounter (Signed)
Approvedtoday  Questionnaire submitted. PA Case [id number] Status: Approved. Authorization and Notifications Completed.

## 2018-02-09 ENCOUNTER — Encounter: Payer: Self-pay | Admitting: Family Medicine

## 2018-02-09 ENCOUNTER — Telehealth: Payer: Self-pay | Admitting: Family Medicine

## 2018-02-09 NOTE — Telephone Encounter (Signed)
Received fax from Covermymeds that Flomax requires a PA. Information has been sent to the insurance company. Awaiting determination.

## 2018-02-10 ENCOUNTER — Telehealth: Payer: Self-pay | Admitting: Family Medicine

## 2018-02-10 MED ORDER — METFORMIN HCL 1000 MG PO TABS: 1000.0000 mg | ORAL_TABLET | Freq: Two times a day (BID) | ORAL | 3 refills | Status: DC

## 2018-02-10 MED ORDER — SILDENAFIL CITRATE 100 MG PO TABS: 50.0000 mg | ORAL_TABLET | Freq: Every day | ORAL | 11 refills | Status: DC | PRN

## 2018-02-10 MED ORDER — ERTUGLIFLOZIN L-PYROGLUTAMICAC 15 MG PO TABS: 1.0000 | ORAL_TABLET | Freq: Every day | ORAL | 1 refills | Status: DC

## 2018-02-10 NOTE — Telephone Encounter (Signed)
Viagra limit for your insurance is 8 pills per 25 day rolling period as per letter I received today. New prescription sent. Let me know if you have any issues.

## 2018-02-11 ENCOUNTER — Telehealth: Payer: Self-pay | Admitting: Family Medicine

## 2018-02-11 MED FILL — metFORMIN HCL 1000 MG TABS: 1000 | 30 days supply | Qty: 60 | Fill #0

## 2018-02-11 MED FILL — STEGLATRO 15 MG TABS: 15 | 30 days supply | Qty: 30 | Fill #0

## 2018-02-11 NOTE — Telephone Encounter (Signed)
Approvedtoday  Questionnaire submitted. PA Case [id number] Status: Approved. Authorization and Notifications Completed. Pharmacy notified.

## 2018-02-14 NOTE — Telephone Encounter (Signed)
[facility] Sildinifil 100mg  tablets that was sent in for 8 per 25 days was not covered due to criteria for Quantity Limitations. Please advise if patient can use GoodRx or check another pharmacy.

## 2018-02-15 MED ORDER — SILDENAFIL CITRATE 100 MG PO TABS: 50.0000 mg | ORAL_TABLET | Freq: Every day | ORAL | 11 refills | Status: DC | PRN

## 2018-02-15 NOTE — Telephone Encounter (Signed)
Mabe please contact the insurance number on the form and figure out how the prescription needs to be sent

## 2018-02-15 NOTE — Telephone Encounter (Signed)
[facility]  [facility] of this medication [facility] will be $70 for 10 pills.  Please inform patient.

## 2018-02-15 NOTE — Addendum Note (Signed)
Addended by: Rodolph BongOREY, EVAN S on: 02/15/2018 12:57 PM   Modules accepted: Orders

## 2018-02-15 NOTE — Telephone Encounter (Signed)
LMOM for patient to call back.

## 2018-02-15 NOTE — Telephone Encounter (Addendum)
After speaking with the insurance([employer]) the Viagra is not covered under this plan. It looks like the patient will have to pay cash price for this medication if he wants to continue to take the mediation. The only dose that appears to not have PA is the 25 mg dose.   Is there another pharmacy you recommend. Please advise.

## 2018-02-16 ENCOUNTER — Ambulatory Visit: Payer: BLUE CROSS/BLUE SHIELD

## 2018-02-16 NOTE — Telephone Encounter (Signed)
Left detailed message with the information about the Sildenafil and the pharmacy it has been sent to for the patient. I also provided their number as well so the patient could call to check the status of the refill. Patient was asked to call back with any questions.

## 2018-02-17 ENCOUNTER — Telehealth: Payer: Self-pay | Admitting: Family Medicine

## 2018-02-17 ENCOUNTER — Encounter: Payer: Self-pay | Admitting: Family Medicine

## 2018-02-17 ENCOUNTER — Ambulatory Visit (INDEPENDENT_AMBULATORY_CARE_PROVIDER_SITE_OTHER): Payer: BLUE CROSS/BLUE SHIELD | Admitting: Family Medicine

## 2018-02-17 VITALS — BP 133/84 | HR 99 | Ht 65.0 in | Wt 221.0 lb

## 2018-02-17 DIAGNOSIS — R338 Other retention of urine: Secondary | ICD-10-CM

## 2018-02-17 DIAGNOSIS — R29898 Other symptoms and signs involving the musculoskeletal system: Secondary | ICD-10-CM | POA: Diagnosis not present

## 2018-02-17 DIAGNOSIS — E782 Mixed hyperlipidemia: Secondary | ICD-10-CM

## 2018-02-17 DIAGNOSIS — M778 Other enthesopathies, not elsewhere classified: Secondary | ICD-10-CM

## 2018-02-17 DIAGNOSIS — N400 Enlarged prostate without lower urinary tract symptoms: Secondary | ICD-10-CM | POA: Insufficient documentation

## 2018-02-17 DIAGNOSIS — E291 Testicular hypofunction: Secondary | ICD-10-CM

## 2018-02-17 DIAGNOSIS — M779 Enthesopathy, unspecified: Secondary | ICD-10-CM

## 2018-02-17 DIAGNOSIS — N401 Enlarged prostate with lower urinary tract symptoms: Secondary | ICD-10-CM | POA: Diagnosis not present

## 2018-02-17 DIAGNOSIS — E119 Type 2 diabetes mellitus without complications: Secondary | ICD-10-CM | POA: Diagnosis not present

## 2018-02-17 DIAGNOSIS — M4802 Spinal stenosis, cervical region: Secondary | ICD-10-CM

## 2018-02-17 DIAGNOSIS — I10 Essential (primary) hypertension: Secondary | ICD-10-CM

## 2018-02-17 LAB — POCT UA - MICROALBUMIN
Creatinine, POC: 200 mg/dL
Microalbumin Ur, POC: 150 mg/L

## 2018-02-17 LAB — POCT GLYCOSYLATED HEMOGLOBIN (HGB A1C): Hemoglobin A1C: 6.8

## 2018-02-17 MED ORDER — TADALAFIL 5 MG PO TABS: 5.0000 mg | ORAL_TABLET | Freq: Every day | ORAL | 11 refills | Status: DC

## 2018-02-17 MED ORDER — PHENTERMINE HCL 37.5 MG PO CAPS: ORAL_CAPSULE | ORAL | 0 refills | Status: DC

## 2018-02-17 MED ORDER — DICLOFENAC SODIUM 1 % TD GEL
2.0000 g | Freq: Four times a day (QID) | TRANSDERMAL | 11 refills | Status: DC
Start: 2018-02-17 — End: 2018-02-22

## 2018-02-17 MED FILL — PHENTERMINE 37.5 MG CAPSULE: 37.5 | 30 days supply | Qty: 30 | Fill #0

## 2018-02-17 NOTE — Telephone Encounter (Signed)
Received fax from Covermymeds that Cialis requires a PA. Information has been sent to the insurance company. Awaiting determination.   

## 2018-02-17 NOTE — Progress Notes (Signed)
Jordan Taylor is a 52 y.o. male who [name] today for follow-up diabetes, discussed dorsal left hand pain, and BPH and cloudy urine.  Diabetes: Jordan Taylor is doing well with Steglatro, metformin, Tradjenta, and glipizide XL.  He notes his blood sugars fasting are typically between 125 and 155.  He has reduced his carbohydrate intake significantly and feels well.  He denies polyuria or polydipsia hyper or hypoglycemic episodes.  Left dorsal hand pain: Jordan Taylor has a history of left hand extensor weakness due to cervical myelopathy.  He had a decompression and cervical fusion about a year ago and has continued residual weakness.  A small swelling at the mid dorsal hand that is mildly tender and painful.  He denies any injury.  He notes his main complaint is that his hand is stiff and uncomfortable.  BPH: Jordan Taylor has BPH with elevated PSA.  He is been seen by urology and is performing watchful waiting.  He notes he continues to have BPH symptoms including frequent voiding incomplete voiding and straining.  He continues to use Flomax but notes the it does not help much.  He has a follow-up appointment with urology next month.  AUA: 33/36.  Quality of life scored 6/6  Obesity: Jordan Taylor continues phentermine.  He notes this helps reduce his diet.  He tolerates it well with no chest pain palpitations or shortness of breath.  Cloudy urine.  Jordan Taylor notes continued cloudy urine and urinary Tatian.  He has had multiple negative urine cultures and STD testing.  Finds this obnoxious.  Past Medical History:  Diagnosis Date  . Essential hypertension 12/04/2013  . Type 2 diabetes mellitus (HCC) 12/04/2013   Past Surgical History:  Procedure Laterality Date  . ANTERIOR CERVICAL DECOMPRESSION/DISCECTOMY FUSION 4 LEVELS N/A 09/07/2016   Procedure:  Anterior Cervical Diskectomy Fusion - Cervical three- Cervical four - Cervical four - Cervical five - Cervical five- Cervical six  - Cervical six - Cervical seven;  Surgeon: Julio SicksHenry Pool, MD;  [facility];  Service: Neurosurgery;  Laterality: N/A;  . KNEE ARTHROSCOPY Left   . spinal cord surgery     20+ yrs ago   Social History   Tobacco Use  . Smoking status: Former Games developermoker  . Smokeless tobacco: Never Used  Substance Use Topics  . Alcohol use: No    Alcohol/week: 0.0 oz   family history includes Alcohol abuse in his father and mother; Cancer in his mother and other; Diabetes in his father; Hypertension in his father.  ROS as above: No headache, visual changes, nausea, vomiting, diarrhea, constipation, dizziness, abdominal pain, skin rash, fevers, chills, night sweats, weight loss, swollen lymph nodes, body aches, joint swelling, muscle aches, chest pain, shortness of breath, mood changes, visual or auditory hallucinations.   Medications: Current Outpatient Medications  Medication Sig Dispense Refill  . AMBULATORY NON FORMULARY MEDICATION 3ml syringe, 21g 1.5 inch needles and 18 gauge blunt filler needles for testosterone injections every 2 weeks. Disp qs x 3 months. 1 each 0  . amLODipine (NORVASC) 10 MG tablet Take 1 tablet (10 mg total) by mouth daily. 90 tablet 1  . benazepril (LOTENSIN) 20 MG tablet TAKE 1 TABLET (20 MG TOTAL) BY MOUTH DAILY. 90 tablet 1  . clindamycin-benzoyl peroxide (BENZACLIN) gel APPLY TO THE AFFECTED AREA TOPICALLY TWICE DAILY 25 g 3  . Ertugliflozin L-PyroglutamicAc (STEGLATRO) 15 MG TABS Take 1 tablet by mouth daily.  90 tablet 1  . loratadine-pseudoephedrine (CLARITIN-D 24 HOUR) 10-240 MG 24 hr tablet Take 1 tablet by mouth daily. 90 tablet 3  . metFORMIN (GLUCOPHAGE) 1000 MG tablet Take 1 tablet (1,000 mg total) by mouth 2 (two) times daily with a meal. 180 tablet 3  . phentermine 37.5 MG capsule One capsule by mouth qAM 30 capsule 0  . pravastatin  (PRAVACHOL) 80 MG tablet Take 1 tablet (80 mg total) by mouth every evening. 90 tablet 1  . tamsulosin (FLOMAX) 0.4 MG CAPS capsule Take 1 capsule (0.4 mg total) by mouth daily after breakfast. 90 capsule 3  . testosterone cypionate (DEPOTESTOSTERONE CYPIONATE) 200 MG/ML injection Inject 1 mL (200 mg total) into the muscle every 14 (fourteen) days. 10 mL 1  . TRADJENTA 5 MG TABS tablet TAKE 1 TABLET (5 MG TOTAL) BY MOUTH DAILY. 90 tablet 1  . diclofenac sodium (VOLTAREN) 1 % GEL Apply 2 g topically 4 (four) times daily. To affected joint. 100 g 11  . tadalafil (CIALIS) 5 MG tablet Take 1 tablet (5 mg total) by mouth daily. Use daily. 30 tablet 11   No current facility-administered medications for this visit.    No Known Allergies  Health Maintenance Health Maintenance  Topic Date Due  . OPHTHALMOLOGY EXAM  03/10/2018  . HEMOGLOBIN A1C  06/15/2018  . FOOT EXAM  12/16/2018  . Fecal DNA (Cologuard)  06/06/2019  . PNEUMOCOCCAL POLYSACCHARIDE VACCINE (2) 09/24/2021  . TETANUS/TDAP  12/10/2024  . INFLUENZA VACCINE  Completed  . Hepatitis C Screening  Completed  . HIV Screening  Completed     Exam:  BP 133/84   Pulse 99   Ht 5\' 5"  (1.651 m)   Wt 221 lb (100.2 kg)   BMI 36.78 kg/m  Gen: Well NAD HEENT: EOMI,  MMM Lungs: Normal work of breathing. CTABL Heart: RRR no MRG Abd: NABS, Soft. Nondistended, Nontender Exts: Brisk capillary refill, warm and well perfused.  Left hand: Extensor atrophy.  Inability to fully extend the hand due to weakness.  Passive motion is intact.  Bossing present at the dorsal carpometacarpal joint proximally minimally tender.   Musculoskeletal ultrasound of the dorsal hand mass reveals bony bossing at the proximal carpometacarpal joint with no effusion or tenosynovitis or masses.  Results for orders placed or performed in visit on 02/17/18 (from the past 72 hour(s))  POCT HgB A1C     Status: None   Collection Time: 02/17/18  9:24 AM  Result Value Ref  Range   Hemoglobin A1C 6.8   POCT UA - Microalbumin     Status: None   Collection Time: 02/17/18 10:00 AM  Result Value Ref Range   Microalbumin Ur, POC 150 mg/L   Creatinine, POC 200 mg/dL   Albumin/Creatinine Ratio, Urine, POC 30-300    No results found.    Assessment and Plan: 52 y.o. male with  Diabetes: A1c much improved.  Continue current regimen.  Discontinue glipizide as that may be constipated to weight gain and likely is not helping much.  Recheck in 2-3 months.  Dorsal hand pain: Likely stiffness due to extensor weakness.  The the mass is carpometacarpal bossing which is not likely the main cause of his symptoms.  Plan for a trial of hand therapy and recheck in a few months.  Additionally will use diclofenac gel.  BPH: Not well controlled with Flomax.  AUA symptom score rates in the severe range.  Patient is failing typical conservative management.  Will use 5 mg  of Cialis daily in addition of Flomax.  We will up with urology.  Recheck PSA.  Obesity: Doing well.  Continue current regimen.  Plan to transition to Qsymia likely in the future.  We will additionally check basic fasting labs to follow-up lipidemia metabolic panel and anemia.  Cloudy urine: We will plan for urine micro and culture.  Follow up with urology.  Orders Placed This Encounter  Procedures  . HM HEPATITIS C SCREENING LAB    This external order was created through the Results Console.  . CBC  . COMPLETE METABOLIC PANEL WITH GFR  . Lipid Panel w/reflex Direct LDL  . Testosterone  . PSA  . Urine Microscopic  . Ambulatory referral to Occupational Therapy    Referral Priority:   Routine    Referral Type:   Occupational Therapy    Referral Reason:   Specialty Services Required    Requested Specialty:   Occupational Therapy    Number of Visits Requested:   1  . POCT HgB A1C  . POCT UA - Microalbumin   Meds ordered this encounter  Medications  . diclofenac sodium (VOLTAREN) 1 % GEL    Sig: Apply 2  g topically 4 (four) times daily. To affected joint.    Dispense:  100 g    Refill:  11  . tadalafil (CIALIS) 5 MG tablet    Sig: Take 1 tablet (5 mg total) by mouth daily. Use daily.    Dispense:  30 tablet    Refill:  11    BPH failed flomax  . phentermine 37.5 MG capsule    Sig: One capsule by mouth qAM    Dispense:  30 capsule    Refill:  0     Discussed warning signs or symptoms. Please see discharge instructions. Patient expresses understanding.

## 2018-02-17 NOTE — Patient Instructions (Addendum)
Thank you for coming in today. Schedule hand therapy.  Use the gel on your hand 4x daily for pain as needed.  We will get cialis approved.  Recheck in 2 months.  Return sooner if needed.  STOP glipizide.   Return sooner if needed.

## 2018-02-18 LAB — URINALYSIS, MICROSCOPIC ONLY: Squamous Epithelial / LPF: NONE SEEN /HPF (ref <=5)

## 2018-02-18 MED ORDER — CEFDINIR 300 MG PO CAPS: 300.0000 mg | ORAL_CAPSULE | Freq: Two times a day (BID) | ORAL | 0 refills | Status: DC

## 2018-02-18 MED ORDER — DOXYCYCLINE HYCLATE 100 MG PO TABS: 100.0000 mg | ORAL_TABLET | Freq: Two times a day (BID) | ORAL | 0 refills | Status: DC

## 2018-02-18 MED FILL — DOXYCYCLINE HYCLATE 100 MG: 100 | 7 days supply | Qty: 14 | Fill #0

## 2018-02-18 MED FILL — CEFDINIR 300 MG CAPS: 300 | 7 days supply | Qty: 14 | Fill #0

## 2018-02-18 NOTE — Addendum Note (Signed)
Addended by: Rodolph BongOREY, Tajanay Hurley S on: 02/18/2018 02:18 PM   Modules accepted: Orders

## 2018-02-21 ENCOUNTER — Encounter: Payer: Self-pay | Admitting: Family Medicine

## 2018-02-22 ENCOUNTER — Encounter: Payer: Self-pay | Admitting: Family Medicine

## 2018-02-22 MED ORDER — DICLOFENAC SODIUM 1 % TD GEL: 2.0000 g | Freq: Four times a day (QID) | TRANSDERMAL | 11 refills | Status: DC

## 2018-03-07 ENCOUNTER — Encounter: Payer: Self-pay | Admitting: Family Medicine

## 2018-03-09 MED ORDER — SILDENAFIL CITRATE 100 MG PO TABS: 50.0000 mg | ORAL_TABLET | Freq: Every day | ORAL | 11 refills | Status: DC | PRN

## 2018-03-09 MED ORDER — SILDENAFIL CITRATE 20 MG PO TABS: 40.0000 mg | ORAL_TABLET | ORAL | 11 refills | Status: DC | PRN

## 2018-03-09 NOTE — Addendum Note (Signed)
Addended by: Rodolph BongOREY, Mackey Varricchio S on: 03/09/2018 04:25 PM   Modules accepted: Orders

## 2018-03-17 MED FILL — AMLODIPINE BESYLATE 10 MG T: 10 | 30 days supply | Qty: 30 | Fill #3

## 2018-03-17 MED FILL — glipiZIDE ER 10 MG TB24: 10 | 30 days supply | Qty: 30 | Fill #2

## 2018-03-17 MED FILL — metFORMIN HCL 1000 MG TABS: 1000 | 30 days supply | Qty: 60 | Fill #1

## 2018-03-17 MED FILL — TAMSULOSIN HCL 0.4 MG CAP: 0.4 | 30 days supply | Qty: 30 | Fill #4

## 2018-03-17 MED FILL — STEGLATRO 15 MG TABS: 15 | 30 days supply | Qty: 30 | Fill #1

## 2018-03-17 MED FILL — TRADJENTA 5 MG TABLET: 5 | 30 days supply | Qty: 30 | Fill #2

## 2018-03-17 MED FILL — BENAZEPRIL HCL 20 MG TABLET: 20 | 30 days supply | Qty: 30 | Fill #3

## 2018-03-24 DIAGNOSIS — E291 Testicular hypofunction: Secondary | ICD-10-CM | POA: Diagnosis not present

## 2018-03-24 DIAGNOSIS — N5201 Erectile dysfunction due to arterial insufficiency: Secondary | ICD-10-CM | POA: Diagnosis not present

## 2018-03-24 DIAGNOSIS — R972 Elevated prostate specific antigen [PSA]: Secondary | ICD-10-CM | POA: Diagnosis not present

## 2018-03-28 ENCOUNTER — Encounter: Payer: Self-pay | Admitting: Family Medicine

## 2018-03-29 ENCOUNTER — Ambulatory Visit (INDEPENDENT_AMBULATORY_CARE_PROVIDER_SITE_OTHER): Payer: BLUE CROSS/BLUE SHIELD | Admitting: Family Medicine

## 2018-03-29 MED ORDER — PHENTERMINE HCL 37.5 MG PO CAPS: ORAL_CAPSULE | ORAL | 0 refills | Status: DC

## 2018-03-29 NOTE — Progress Notes (Signed)
Left pt msg med was refilled, call back info provided if any needs arise.

## 2018-03-29 NOTE — Progress Notes (Signed)
   Subjective:    Patient ID: Jordan Taylor, male    DOB: [birth year], 52 y.o.   MRN: [id number]  HPI   Pt is here for BP and weight check. Denied trouble sleeping, palpitations, or medication problems.   BP was elevated on first check at 146/82.   Review of Systems     Objective:   Physical Exam        Assessment & Plan:   Patient has lost weight. Previously 221 to 218.2 today.   After 5 minutes of rest, pt's BP went down to 129/82. RX is pended. [facility] if appropriate. Thanks.

## 2018-03-29 NOTE — Progress Notes (Signed)
Vitals:   03/29/18 0909 03/29/18 0917  BP: (!) 146/82 129/82  Pulse: (!) 105 98   Wt Readings from Last 5 Encounters:  03/29/18 218 lb 3.2 oz (99 kg)  02/17/18 221 lb (100.2 kg)  01/19/18 215 lb (97.5 kg)  12/16/17 217 lb (98.4 kg)  09/16/17 215 lb (97.5 kg)   Refill Phentermine.

## 2018-04-08 ENCOUNTER — Encounter: Payer: Self-pay | Admitting: Family Medicine

## 2018-04-08 MED ORDER — TADALAFIL 5 MG PO TABS: 5.0000 mg | ORAL_TABLET | Freq: Every day | ORAL | 12 refills | Status: DC

## 2018-04-14 ENCOUNTER — Other Ambulatory Visit: Payer: Self-pay | Admitting: Family Medicine

## 2018-04-14 MED FILL — glipiZIDE ER 10 MG TB24: 10 | 30 days supply | Qty: 30 | Fill #3

## 2018-04-14 MED FILL — STEGLATRO 15 MG TABS: 15 | 30 days supply | Qty: 30 | Fill #2

## 2018-04-14 MED FILL — TAMSULOSIN HCL 0.4 MG CAP: 0.4 | 30 days supply | Qty: 30 | Fill #5

## 2018-04-14 MED FILL — metFORMIN HCL 1000 MG TABS: 1000 | 30 days supply | Qty: 60 | Fill #2

## 2018-04-14 MED FILL — BENAZEPRIL HCL 20 MG TABLET: 20 | 30 days supply | Qty: 30 | Fill #0

## 2018-04-14 MED FILL — AMLODIPINE BESYLATE 10 MG T: 10 | 30 days supply | Qty: 30 | Fill #4

## 2018-04-26 ENCOUNTER — Ambulatory Visit: Payer: BLUE CROSS/BLUE SHIELD

## 2018-05-20 ENCOUNTER — Other Ambulatory Visit: Payer: Self-pay | Admitting: Family Medicine

## 2018-05-20 DIAGNOSIS — E119 Type 2 diabetes mellitus without complications: Secondary | ICD-10-CM

## 2018-05-20 MED FILL — TAMSULOSIN HCL 0.4 MG CAP: 0.4 | 30 days supply | Qty: 30 | Fill #6

## 2018-05-20 MED FILL — glipiZIDE ER 10 MG TB24: 10 | 30 days supply | Qty: 30 | Fill #0

## 2018-05-20 MED FILL — metFORMIN HCL 1000 MG TABS: 1000 | 30 days supply | Qty: 60 | Fill #3

## 2018-05-20 MED FILL — BENAZEPRIL HCL 20 MG TABLET: 20 | 30 days supply | Qty: 30 | Fill #1

## 2018-05-20 MED FILL — STEGLATRO 15 MG TABS: 15 | 30 days supply | Qty: 30 | Fill #3

## 2018-05-20 MED FILL — AMLODIPINE BESYLATE 10 MG T: 10 | 30 days supply | Qty: 30 | Fill #5

## 2018-06-22 ENCOUNTER — Other Ambulatory Visit: Payer: Self-pay | Admitting: Family Medicine

## 2018-06-22 DIAGNOSIS — E119 Type 2 diabetes mellitus without complications: Secondary | ICD-10-CM

## 2018-06-22 DIAGNOSIS — I10 Essential (primary) hypertension: Secondary | ICD-10-CM

## 2018-06-22 MED FILL — BENAZEPRIL HCL 20 MG TABLET: 20 | 30 days supply | Qty: 30 | Fill #2

## 2018-06-22 MED FILL — AMLODIPINE BESYLATE 10 MG T: 10 | 30 days supply | Qty: 30 | Fill #0

## 2018-06-22 MED FILL — STEGLATRO 15 MG TABS: 15 | 30 days supply | Qty: 30 | Fill #4

## 2018-06-22 MED FILL — TAMSULOSIN HCL 0.4 MG CAP: 0.4 | 30 days supply | Qty: 30 | Fill #7

## 2018-06-22 MED FILL — metFORMIN HCL 1000 MG TABS: 1000 | 30 days supply | Qty: 60 | Fill #4

## 2018-07-05 ENCOUNTER — Telehealth: Payer: Self-pay | Admitting: Family Medicine

## 2018-07-05 DIAGNOSIS — E119 Type 2 diabetes mellitus without complications: Secondary | ICD-10-CM

## 2018-07-05 MED ORDER — GLIPIZIDE ER 10 MG PO TB24: 10.0000 mg | ORAL_TABLET | Freq: Every day | ORAL | 0 refills | Status: DC

## 2018-07-05 MED FILL — glipiZIDE ER 10 MG TB24: 10 | 15 days supply | Qty: 15 | Fill #0

## 2018-07-05 NOTE — Telephone Encounter (Signed)
Jordan Taylor called today to schedule an appt. For med management. We were not able to get him in until 07/18/18. He is needing a refill on glipizide called in. He stated that he was completely out. Please send to pharmacy on file.

## 2018-07-18 ENCOUNTER — Ambulatory Visit (INDEPENDENT_AMBULATORY_CARE_PROVIDER_SITE_OTHER): Payer: BLUE CROSS/BLUE SHIELD | Admitting: Family Medicine

## 2018-07-18 ENCOUNTER — Encounter: Payer: Self-pay | Admitting: Family Medicine

## 2018-07-18 VITALS — BP 120/80 | HR 91 | Wt 225.0 lb

## 2018-07-18 DIAGNOSIS — I10 Essential (primary) hypertension: Secondary | ICD-10-CM

## 2018-07-18 DIAGNOSIS — E782 Mixed hyperlipidemia: Secondary | ICD-10-CM

## 2018-07-18 DIAGNOSIS — E119 Type 2 diabetes mellitus without complications: Secondary | ICD-10-CM | POA: Diagnosis not present

## 2018-07-18 DIAGNOSIS — N401 Enlarged prostate with lower urinary tract symptoms: Secondary | ICD-10-CM | POA: Diagnosis not present

## 2018-07-18 DIAGNOSIS — Z23 Encounter for immunization: Secondary | ICD-10-CM | POA: Diagnosis not present

## 2018-07-18 DIAGNOSIS — R338 Other retention of urine: Secondary | ICD-10-CM

## 2018-07-18 MED ORDER — PHENTERMINE HCL 37.5 MG PO TABS: ORAL_TABLET | ORAL | 0 refills | Status: DC

## 2018-07-18 MED ORDER — AMLODIPINE BESYLATE 10 MG PO TABS: 10.0000 mg | ORAL_TABLET | Freq: Every day | ORAL | 2 refills | Status: DC

## 2018-07-18 MED ORDER — GLIPIZIDE ER 10 MG PO TB24: 10.0000 mg | ORAL_TABLET | Freq: Every day | ORAL | 2 refills | Status: DC

## 2018-07-18 MED FILL — AMLODIPINE BESYLATE 10 MG T: 10 | 30 days supply | Qty: 30 | Fill #0

## 2018-07-18 NOTE — Patient Instructions (Signed)
Thank you for coming in today. Get labs now.  Recheck with me in 3 months and nurse visit weight / phentermine check in 1 month.   Return sooner if needed.

## 2018-07-18 NOTE — Progress Notes (Signed)
Jordan Taylor is a 52 y.o. male who [facility]: Primary Care Sports Medicine today for medication follow up.   Jordan Taylor has had no issues with his diabetes and HTN medications. He says he regularly takes his blood pressure at home. He says they are normal but cannot remember any values. He says he is up to date with his diabetic foot and eye exams. He also says that he has stopped taking his testosterone and is doing well without it. He is requesting to go back on phentermine and is asking for the extended release tablets rather than the capsules that he took last time.     ROS as above:  Exam:  BP 120/80   Pulse 91   Wt 225 lb (102.1 kg)   BMI 37.44 kg/m  Gen: Well NAD HEENT: EOMI,  MMM Lungs: Normal work of breathing. CTABL Heart: RRR no MRG Abd: NABS, Soft. Nondistended, Nontender Exts: Brisk capillary refill, warm and well perfused.       Assessment and Plan: 52 y.o. male with diabetes and HTN here for follow up. His blood pressure is excellent today and no changes to his medications is necessary.   Diabetes is typically well controlled.  A1c was within range at last check.  Will check A1c as well today.  Continue current regimen for diabetes. He will get his abs today and his HgA1c. He got his flu shot today.   Morbid obesity: Discussed options.  Plan for reduced carbohydrate and calorie diet as part of a comprehensive management strategy for his obesity.  Additionally will l restart phentermine extended release tablets. He will be back in 1 month for a nursing visit for the phentermine  Influenza vaccine given today  We will recheck basic lipid panel metabolic panel and PSA to follow-up lipidemia diabetes and hypertension as above.  Additionally will check PSA for elevated PSA and BPH symptoms.   Check back in 3 months.   Orders Placed This Encounter    Procedures  . Flu Vaccine QUAD 36+ mos IM    cunningham  . CBC  . COMPLETE METABOLIC PANEL WITH GFR  . Lipid Panel w/reflex Direct LDL  . Hemoglobin A1c  . PSA   Meds ordered this encounter  Medications  . glipiZIDE (GLUCOTROL XL) 10 MG 24 hr tablet    Sig: Take 1 tablet (10 mg total) by mouth daily with breakfast.    Dispense:  90 tablet    Refill:  2  . amLODipine (NORVASC) 10 MG tablet    Sig: Take 1 tablet (10 mg total) by mouth daily.    Dispense:  90 tablet    Refill:  2  . phentermine (ADIPEX-P) 37.5 MG tablet    Sig: One tab by mouth qAM    Dispense:  30 tablet    Refill:  0     Historical information moved to improve visibility of documentation.  Past Medical History:  Diagnosis Date  . Essential hypertension 12/04/2013  . Type 2 diabetes mellitus (HCC) 12/04/2013   Past Surgical History:  Procedure Laterality Date  . ANTERIOR CERVICAL DECOMPRESSION/DISCECTOMY FUSION 4 LEVELS N/A 09/07/2016   Procedure: Anterior Cervical Diskectomy Fusion - Cervical three- Cervical four - Cervical four - Cervical five - Cervical five- Cervical six  - Cervical six - Cervical seven;  Surgeon: Julio SicksHenry Pool, MD;  [facility] OR;  Service: Neurosurgery;  Laterality: N/A;  . KNEE ARTHROSCOPY Left   .  spinal cord surgery     20+ yrs ago   Social History   Tobacco Use  . Smoking status: Former Games developermoker  . Smokeless tobacco: Never Used  Substance Use Topics  . Alcohol use: No    Alcohol/week: 0.0 standard drinks   family history includes Alcohol abuse in his father and mother; Cancer in his mother and other; Diabetes in his father; Hypertension in his father.  Medications: Current Outpatient Medications  Medication Sig Dispense Refill  . AMBULATORY NON FORMULARY MEDICATION 3ml syringe, 21g 1.5 inch needles and 18 gauge blunt filler needles for testosterone injections every 2 weeks. Disp qs x 3 months. 1 each 0  . amLODipine (NORVASC) 10 MG tablet Take 1 tablet (10 mg total) by mouth  daily. 90 tablet 2  . benazepril (LOTENSIN) 20 MG tablet TAKE 1 TABLET BY MOUTH DAILY 90 tablet 1  . clindamycin-benzoyl peroxide (BENZACLIN) gel APPLY TO THE AFFECTED AREA TOPICALLY TWICE DAILY 25 g 3  . diclofenac sodium (VOLTAREN) 1 % GEL Apply 2 g topically 4 (four) times daily. To affected joint. 100 g 11  . Ertugliflozin L-PyroglutamicAc (STEGLATRO) 15 MG TABS Take 1 tablet by mouth daily. 90 tablet 1  . glipiZIDE (GLUCOTROL XL) 10 MG 24 hr tablet Take 1 tablet (10 mg total) by mouth daily with breakfast. 90 tablet 2  . loratadine-pseudoephedrine (CLARITIN-D 24 HOUR) 10-240 MG 24 hr tablet Take 1 tablet by mouth daily. 90 tablet 3  . metFORMIN (GLUCOPHAGE) 1000 MG tablet Take 1 tablet (1,000 mg total) by mouth 2 (two) times daily with a meal. 180 tablet 3  . pravastatin (PRAVACHOL) 80 MG tablet Take 1 tablet (80 mg total) by mouth every evening. 90 tablet 1  . tadalafil (CIALIS) 5 MG tablet Take 1 tablet (5 mg total) by mouth daily. 30 tablet 12  . tamsulosin (FLOMAX) 0.4 MG CAPS capsule Take 1 capsule (0.4 mg total) by mouth daily after breakfast. 90 capsule 3  . TRADJENTA 5 MG TABS tablet TAKE 1 TABLET (5 MG TOTAL) BY MOUTH DAILY. 90 tablet 1  . phentermine (ADIPEX-P) 37.5 MG tablet One tab by mouth qAM 30 tablet 0   No current facility-administered medications for this visit.    No Known Allergies   Discussed warning signs or symptoms. Please see discharge instructions. Patient expresses understanding.  I personally was present and performed or re-performed the history, physical exam and medical decision-making activities of this service and have verified that the service and findings are accurately documented in the student's note. ___________________________________________ Clementeen GrahamEvan Genessis Flanary M.D., ABFM., CAQSM. Primary Care and Sports Medicine Adjunct Instructor of Family Medicine  [employer]

## 2018-07-19 ENCOUNTER — Other Ambulatory Visit: Payer: Self-pay

## 2018-07-19 LAB — CBC
HCT: 43.6 % (ref 38.5–50.0)
Hemoglobin: 15.1 g/dL (ref 13.2–17.1)
MCH: 30.6 pg (ref 27.0–33.0)
MCHC: 34.6 g/dL (ref 32.0–36.0)
MCV: 88.3 fL (ref 80.0–100.0)
MPV: 10 fL (ref 7.5–12.5)
Platelets: 242 10*3/uL (ref 140–400)
RBC: 4.94 10*6/uL (ref 4.20–5.80)
RDW: 14.6 % (ref 11.0–15.0)
WBC: 9.2 10*3/uL (ref 3.8–10.8)

## 2018-07-19 LAB — COMPLETE METABOLIC PANEL WITH GFR
AG Ratio: 1.4 (calc) (ref 1.0–2.5)
ALT: 65 U/L — ABNORMAL HIGH (ref 9–46)
AST: 32 U/L (ref 10–35)
Albumin: 4.2 g/dL (ref 3.6–5.1)
Alkaline phosphatase (APISO): 120 U/L — ABNORMAL HIGH (ref 40–115)
BUN: 20 mg/dL (ref 7–25)
CO2: 23 mmol/L (ref 20–32)
Calcium: 9.5 mg/dL (ref 8.6–10.3)
Chloride: 107 mmol/L (ref 98–110)
Creat: 1.1 mg/dL (ref 0.70–1.33)
GFR, Est African American: 88 mL/min/{1.73_m2} (ref >=60)
GFR, Est Non African American: 76 mL/min/{1.73_m2} (ref >=60)
Globulin: 3 g/dL (calc) (ref 1.9–3.7)
Glucose, Bld: 235 mg/dL — ABNORMAL HIGH (ref 65–99)
Potassium: 4.7 mmol/L (ref 3.5–5.3)
Sodium: 139 mmol/L (ref 135–146)
Total Bilirubin: 0.5 mg/dL (ref 0.2–1.2)
Total Protein: 7.2 g/dL (ref 6.1–8.1)

## 2018-07-19 LAB — HEMOGLOBIN A1C
Hgb A1c MFr Bld: 9.6 % of total Hgb — ABNORMAL HIGH (ref <5.7)
Mean Plasma Glucose: 229 (calc)
eAG (mmol/L): 12.7 (calc)

## 2018-07-19 LAB — LIPID PANEL W/REFLEX DIRECT LDL
Cholesterol: 186 mg/dL (ref <200)
HDL: 46 mg/dL (ref >40)
LDL Cholesterol (Calc): 111 mg/dL (calc) — ABNORMAL HIGH
Non-HDL Cholesterol (Calc): 140 mg/dL (calc) — ABNORMAL HIGH (ref <130)
Total CHOL/HDL Ratio: 4 (calc) (ref <5.0)
Triglycerides: 171 mg/dL — ABNORMAL HIGH (ref <150)

## 2018-07-19 LAB — PSA: PSA: 4 ng/mL (ref <=4.0)

## 2018-07-19 MED ORDER — ATORVASTATIN CALCIUM 40 MG PO TABS: 40.0000 mg | ORAL_TABLET | Freq: Every day | ORAL | 1 refills | Status: DC

## 2018-07-19 MED FILL — ATORVASTATIN 40 MG TABLET: 40 | 30 days supply | Qty: 30 | Fill #0

## 2018-07-19 NOTE — Addendum Note (Signed)
Addended by: Rodolph BongOREY, Xzaviar Maloof S on: 07/19/2018 07:17 AM   Modules accepted: Orders

## 2018-07-26 MED FILL — TAMSULOSIN HCL 0.4 MG CAP: 0.4 | 30 days supply | Qty: 30 | Fill #8

## 2018-07-26 MED FILL — STEGLATRO 15 MG TABS: 15 | 30 days supply | Qty: 30 | Fill #5

## 2018-07-26 MED FILL — metFORMIN HCL 1000 MG TABS: 1000 | 30 days supply | Qty: 60 | Fill #5

## 2018-07-26 MED FILL — BENAZEPRIL HCL 20 MG TABLET: 20 | 30 days supply | Qty: 30 | Fill #3

## 2018-08-17 ENCOUNTER — Ambulatory Visit (INDEPENDENT_AMBULATORY_CARE_PROVIDER_SITE_OTHER): Payer: BLUE CROSS/BLUE SHIELD | Admitting: Family Medicine

## 2018-08-17 ENCOUNTER — Encounter: Payer: Self-pay | Admitting: Family Medicine

## 2018-08-17 VITALS — BP 135/55 | HR 57 | Wt 220.0 lb

## 2018-08-17 DIAGNOSIS — E119 Type 2 diabetes mellitus without complications: Secondary | ICD-10-CM | POA: Diagnosis not present

## 2018-08-17 DIAGNOSIS — I10 Essential (primary) hypertension: Secondary | ICD-10-CM

## 2018-08-17 DIAGNOSIS — R338 Other retention of urine: Secondary | ICD-10-CM

## 2018-08-17 DIAGNOSIS — N401 Enlarged prostate with lower urinary tract symptoms: Secondary | ICD-10-CM

## 2018-08-17 MED ORDER — TAMSULOSIN HCL 0.4 MG PO CAPS: 0.4000 mg | ORAL_CAPSULE | Freq: Every day | ORAL | 3 refills | Status: DC

## 2018-08-17 MED ORDER — PHENTERMINE HCL 37.5 MG PO TABS: ORAL_TABLET | ORAL | 0 refills | Status: DC

## 2018-08-17 MED ORDER — METFORMIN HCL 1000 MG PO TABS: 1000.0000 mg | ORAL_TABLET | Freq: Two times a day (BID) | ORAL | 3 refills | Status: DC

## 2018-08-17 MED ORDER — BENAZEPRIL HCL 20 MG PO TABS: ORAL_TABLET | ORAL | 1 refills | Status: DC

## 2018-08-17 MED ORDER — AMLODIPINE BESYLATE 10 MG PO TABS: 10.0000 mg | ORAL_TABLET | Freq: Every day | ORAL | 2 refills | Status: DC

## 2018-08-17 MED ORDER — DULAGLUTIDE 0.75 MG/0.5ML ~~LOC~~ SOAJ: 0.5000 mL | SUBCUTANEOUS | 11 refills | Status: DC

## 2018-08-17 MED ORDER — ERTUGLIFLOZIN L-PYROGLUTAMICAC 15 MG PO TABS: 1.0000 | ORAL_TABLET | Freq: Every day | ORAL | 1 refills | Status: DC

## 2018-08-17 MED ORDER — ATORVASTATIN CALCIUM 40 MG PO TABS: 40.0000 mg | ORAL_TABLET | Freq: Every day | ORAL | 1 refills | Status: DC

## 2018-08-17 MED FILL — TRULICITY 0.75 MG/0.5 ML PE: 0.75 | 28 days supply | Qty: 2 | Fill #0

## 2018-08-17 NOTE — Progress Notes (Signed)
Jordan Taylor is a 52 y.o. male who [name] today for follow-up diabetes, hypertension, obesity.  Thadd was seen last month for diabetes follow-up.  At that visit his A1c when checked in the lab was elevated at 9.6.  This is unusual for him and much higher than usual.  He notes that prior to that lab he had traveled and accident left his medicine behind normal and was without medicine for about 10 days.  Additionally he notes that he had discontinued glipizide and his  Tradjenta.  He notes his blood sugars when checking fasting now are about 170s.  He denies polyuria polydipsia.  Hypertension: Patient takes blood pressure medications listed below.  No chest pain palpitations shortness of breath.  Obesity: Currently taking phentermine.  He notes this does help suppress his appetite and he is losing weight.  He denies of noxious side effects.   ROS as above:  Exam:  BP (!) 135/55   Pulse (!) 57   Wt 220 lb (99.8 kg)   BMI 36.61 kg/m  Wt Readings from Last 5 Encounters:  08/17/18 220 lb (99.8 kg)  07/18/18 225 lb (102.1 kg)  03/29/18 218 lb 3.2 oz (99 kg)  02/17/18 221 lb (100.2 kg)  01/19/18 215 lb (97.5 kg)    Gen: Well NAD HEENT: EOMI,  MMM Lungs: Normal work of breathing. CTABL Heart: RRR no MRG Abd: NABS, Soft. Nondistended, Nontender Exts: Brisk capillary refill, warm and well perfused.   Lab and Radiology Results No results found for this or any previous visit (from the past 72 hour(s)). No results found.    Assessment and Plan: 52 y.o. male with  Diabetes: Not well controlled.  Plan to continue metformin and Steglatro.  Add Trulicity and recheck in 1 month.  Continue to check fasting blood sugar.  Hypertension: Blood pressure reasonably well-controlled continue current regimen.  Obesity: Doing well with phentermine  continue current regimen.  Recheck in 1 month.  Trulicity should also help as well.  Flomax refilled.  This treats BPH. Orders Placed This Encounter  Procedures  . POCT HgB A1C   Meds ordered this encounter  Medications  . atorvastatin (LIPITOR) 40 MG tablet    Sig: Take 1 tablet (40 mg total) by mouth daily.    Dispense:  90 tablet    Refill:  1  . metFORMIN (GLUCOPHAGE) 1000 MG tablet    Sig: Take 1 tablet (1,000 mg total) by mouth 2 (two) times daily with a meal.    Dispense:  180 tablet    Refill:  3  . amLODipine (NORVASC) 10 MG tablet    Sig: Take 1 tablet (10 mg total) by mouth daily.    Dispense:  90 tablet    Refill:  2  . Ertugliflozin L-PyroglutamicAc (STEGLATRO) 15 MG TABS    Sig: Take 1 tablet by mouth daily.    Dispense:  90 tablet    Refill:  1  . tamsulosin (FLOMAX) 0.4 MG CAPS capsule    Sig: Take 1 capsule (0.4 mg total) by mouth daily after breakfast.    Dispense:  90 capsule    Refill:  3  . benazepril (LOTENSIN) 20 MG tablet    Sig: TAKE 1 TABLET BY MOUTH DAILY    Dispense:  90 tablet    Refill:  1     Historical information moved to improve visibility of documentation.  Past Medical History:  Diagnosis Date  . Essential hypertension 12/04/2013  . Type 2 diabetes mellitus (HCC) 12/04/2013   Past Surgical History:  Procedure Laterality Date  . ANTERIOR CERVICAL DECOMPRESSION/DISCECTOMY FUSION 4 LEVELS N/A 09/07/2016   Procedure: Anterior Cervical Diskectomy Fusion - Cervical three- Cervical four - Cervical four - Cervical five - Cervical five- Cervical six  - Cervical six - Cervical seven;  Surgeon: Julio Sicks, MD;  Location: [facility] OR;  Service: Neurosurgery;  Laterality: N/A;  . KNEE ARTHROSCOPY Left   . spinal cord surgery     20+ yrs ago   Social History   Tobacco Use  . Smoking status: Former Games developer  . Smokeless tobacco: Never Used  Substance Use Topics  . Alcohol use: No    Alcohol/week: 0.0 standard drinks   family history includes Alcohol  abuse in his father and mother; Cancer in his mother and other; Diabetes in his father; Hypertension in his father.  Medications: Current Outpatient Medications  Medication Sig Dispense Refill  . AMBULATORY NON FORMULARY MEDICATION 3ml syringe, 21g 1.5 inch needles and 18 gauge blunt filler needles for testosterone injections every 2 weeks. Disp qs x 3 months. 1 each 0  . amLODipine (NORVASC) 10 MG tablet Take 1 tablet (10 mg total) by mouth daily. 90 tablet 2  . atorvastatin (LIPITOR) 40 MG tablet Take 1 tablet (40 mg total) by mouth daily. 90 tablet 1  . benazepril (LOTENSIN) 20 MG tablet TAKE 1 TABLET BY MOUTH DAILY 90 tablet 1  . Ertugliflozin L-PyroglutamicAc (STEGLATRO) 15 MG TABS Take 1 tablet by mouth daily. 90 tablet 1  . glipiZIDE (GLUCOTROL XL) 10 MG 24 hr tablet Take 1 tablet (10 mg total) by mouth daily with breakfast. 90 tablet 2  . metFORMIN (GLUCOPHAGE) 1000 MG tablet Take 1 tablet (1,000 mg total) by mouth 2 (two) times daily with a meal. 180 tablet 3  . phentermine (ADIPEX-P) 37.5 MG tablet One tab by mouth qAM 30 tablet 0  . tadalafil (CIALIS) 5 MG tablet Take 1 tablet (5 mg total) by mouth daily. 30 tablet 12  . tamsulosin (FLOMAX) 0.4 MG CAPS capsule Take 1 capsule (0.4 mg total) by mouth daily after breakfast. 90 capsule 3  . TRADJENTA 5 MG TABS tablet TAKE 1 TABLET (5 MG TOTAL) BY MOUTH DAILY. 90 tablet 1   No current facility-administered medications for this visit.    No Known Allergies   Discussed warning signs or symptoms. Please see discharge instructions. Patient expresses understanding.

## 2018-08-17 NOTE — Patient Instructions (Addendum)
Thank you for coming in today. We will start trulicity.  Continue current diabetes medicine.  Other similar medicines are Ozempic and Bydureon.   Recheck in about 1 month.  Return sooner if needed  Keep track of blood sugar.   Continue phentermine.

## 2018-08-24 MED FILL — ATORVASTATIN 40 MG TABLET: 40 | 30 days supply | Qty: 30 | Fill #1

## 2018-08-24 MED FILL — metFORMIN HCL 1000 MG TABS: 1000 | 30 days supply | Qty: 60 | Fill #0

## 2018-08-24 MED FILL — STEGLATRO 15 MG TABS: 15 | 30 days supply | Qty: 30 | Fill #0

## 2018-08-24 MED FILL — AMLODIPINE BESYLATE 10 MG T: 10 | 30 days supply | Qty: 30 | Fill #0

## 2018-08-24 MED FILL — BENAZEPRIL HCL 20 MG TABLET: 20 | 30 days supply | Qty: 30 | Fill #4

## 2018-08-24 MED FILL — TAMSULOSIN HCL 0.4 MG CAP: 0.4 | 30 days supply | Qty: 30 | Fill #0

## 2018-09-01 ENCOUNTER — Encounter: Payer: Self-pay | Admitting: Family Medicine

## 2018-09-01 MED ORDER — AZITHROMYCIN 250 MG PO TABS: 250.0000 mg | ORAL_TABLET | Freq: Every day | ORAL | 0 refills | Status: DC

## 2018-09-01 MED FILL — AZITHROMYCIN 250 MG TABLET: 250 | 5 days supply | Qty: 6 | Fill #0

## 2018-09-15 ENCOUNTER — Ambulatory Visit: Payer: 59 | Admitting: Family Medicine

## 2018-09-30 ENCOUNTER — Ambulatory Visit (INDEPENDENT_AMBULATORY_CARE_PROVIDER_SITE_OTHER): Payer: BLUE CROSS/BLUE SHIELD | Admitting: Family Medicine

## 2018-09-30 MED ORDER — PHENTERMINE HCL 37.5 MG PO TABS: ORAL_TABLET | ORAL | 0 refills | Status: DC

## 2018-09-30 NOTE — Progress Notes (Signed)
Patient is here for blood pressure and weight check. Denies any trouble sleeping, palpitations, or any other medication problems. Patient has lost weight. A refill for Phentermine will be sent to Provider for signature, Pt requested Rx be printed so it can "find the cheapest pharmacy." Patient advised to schedule a four week nurse visit and keep upcoming appointment with PCP. Verbalized understanding, no further questions.  . Vitals:   09/30/18 0835  BP: 123/67  Pulse: (!) 106

## 2018-10-05 ENCOUNTER — Telehealth: Payer: Self-pay | Admitting: Family Medicine

## 2018-10-05 MED ORDER — DAPAGLIFLOZIN PROPANEDIOL 10 MG PO TABS: 10.0000 mg | ORAL_TABLET | Freq: Every day | ORAL | 1 refills | Status: DC

## 2018-10-05 NOTE — Telephone Encounter (Signed)
[facility] saying that the [facility] medicine for diabetes was declined.  It could be that your insurance wants me to use a different brand.  We will investigate.  I recommend that you also do an investigation and call your insurance and see if Marcelline Deist or Januvia or Theodis Sato is preferred.

## 2018-10-05 NOTE — Telephone Encounter (Signed)
Switch from [employer] to [place].  Use coupon.

## 2018-10-05 NOTE — Telephone Encounter (Signed)
Received  a message from insurance that [facility] is the preferred product. Please send to pharmacy.

## 2018-10-24 ENCOUNTER — Other Ambulatory Visit: Payer: Self-pay | Admitting: Family Medicine

## 2018-11-02 LAB — HM DIABETES EYE EXAM

## 2018-11-03 ENCOUNTER — Ambulatory Visit: Payer: BLUE CROSS/BLUE SHIELD

## 2018-11-27 ENCOUNTER — Other Ambulatory Visit: Payer: Self-pay | Admitting: Family Medicine

## 2018-12-13 ENCOUNTER — Other Ambulatory Visit: Payer: Self-pay

## 2018-12-13 MED ORDER — BENAZEPRIL HCL 20 MG PO TABS: ORAL_TABLET | ORAL | 0 refills | Status: DC

## 2018-12-16 ENCOUNTER — Encounter: Payer: Self-pay | Admitting: Family Medicine

## 2018-12-16 ENCOUNTER — Ambulatory Visit (INDEPENDENT_AMBULATORY_CARE_PROVIDER_SITE_OTHER): Payer: BLUE CROSS/BLUE SHIELD | Admitting: Family Medicine

## 2018-12-16 VITALS — BP 134/91 | HR 94 | Wt 237.0 lb

## 2018-12-16 DIAGNOSIS — E1169 Type 2 diabetes mellitus with other specified complication: Secondary | ICD-10-CM

## 2018-12-16 DIAGNOSIS — E119 Type 2 diabetes mellitus without complications: Secondary | ICD-10-CM | POA: Diagnosis not present

## 2018-12-16 DIAGNOSIS — E1159 Type 2 diabetes mellitus with other circulatory complications: Secondary | ICD-10-CM

## 2018-12-16 DIAGNOSIS — I10 Essential (primary) hypertension: Secondary | ICD-10-CM

## 2018-12-16 DIAGNOSIS — E785 Hyperlipidemia, unspecified: Secondary | ICD-10-CM

## 2018-12-16 DIAGNOSIS — Z0000 Encounter for general adult medical examination without abnormal findings: Secondary | ICD-10-CM | POA: Diagnosis not present

## 2018-12-16 DIAGNOSIS — I152 Hypertension secondary to endocrine disorders: Secondary | ICD-10-CM

## 2018-12-16 DIAGNOSIS — E118 Type 2 diabetes mellitus with unspecified complications: Secondary | ICD-10-CM

## 2018-12-16 LAB — POCT GLYCOSYLATED HEMOGLOBIN (HGB A1C): Hemoglobin A1C: 12.9 % — AB (ref 4.0–5.6)

## 2018-12-16 MED ORDER — AMBULATORY NON FORMULARY MEDICATION: 0 refills | Status: DC

## 2018-12-16 MED ORDER — INSULIN DEGLUDEC 100 UNIT/ML ~~LOC~~ SOPN: 10.0000 [IU] | PEN_INJECTOR | Freq: Every day | SUBCUTANEOUS | 12 refills | Status: DC

## 2018-12-16 MED ORDER — DULAGLUTIDE 1.5 MG/0.5ML ~~LOC~~ SOAJ: 1.5000 mg | SUBCUTANEOUS | 12 refills | Status: DC

## 2018-12-16 MED ORDER — INSULIN PEN NEEDLE 33G X 4 MM MISC: 1.0000 | Freq: Every day | 12 refills | Status: DC

## 2018-12-16 NOTE — Progress Notes (Addendum)
Jordan Taylor is a 53 y.o. male who presents to [facility]: Primary Care Sports Medicine today for well adult visit.  Jordan Taylor is doing reasonably well.  He notes that he takes his medications listed below (aside from insulin which was added today).  He exercises regularly 5 days a week.  He does not eat a careful lower carbohydrate diet.  He does not drink alcohol.  He denies any falls.  He is pretty happy with how things are going.  He notes that he had a diabetic eye exam at the [facility] on [address] in [place] recently.  Hypertension: Currently taking medications listed below.  No chest pain palpitations or shortness of breath.  Diabetes: Currently prescribed Farxiga, Trulicity 0.75, metformin. He does not have a glucometer and does not check his blood sugar.  He feels well with no known hyperglycemia or hypoglycemia or polydipsia.  He does not have a carbohydrate goal for his diabetes diet.   Obesity:  Jahaad continues to work on weight loss.  He tries eat a lower calorie diet and exercise regularly.  He in the past has taken phentermine with mixed results.    ROS as above:  Past Medical History:  Diagnosis Date  . Essential hypertension 12/04/2013  . Type 2 diabetes mellitus (HCC) 12/04/2013   Past Surgical History:  Procedure Laterality Date  . ANTERIOR CERVICAL DECOMPRESSION/DISCECTOMY FUSION 4 LEVELS N/A 09/07/2016   Procedure: Anterior Cervical Diskectomy Fusion - Cervical three- Cervical four - Cervical four - Cervical five - Cervical five- Cervical six  - Cervical six - Cervical seven;  Surgeon: Julio SicksHenry Pool, MD;  [facility] OR;  Service: Neurosurgery;  Laterality: N/A;  . KNEE ARTHROSCOPY Left   . spinal cord surgery     20+ yrs ago   Social History   Tobacco Use  . Smoking status: Former Games developermoker  . Smokeless tobacco: Never Used  Substance Use Topics  .  Alcohol use: No    Alcohol/week: 0.0 standard drinks   family history includes Alcohol abuse in his father and mother; Cancer in his mother and another family member; Diabetes in his father; Hypertension in his father.  Medications: Current Outpatient Medications  Medication Sig Dispense Refill  . AMBULATORY NON FORMULARY MEDICATION 3ml syringe, 21g 1.5 inch needles and 18 gauge blunt filler needles for testosterone injections every 2 weeks. Disp qs x 3 months. 1 each 0  . AMBULATORY NON FORMULARY MEDICATION Single glucometer with lancets, test strips. Test daily. E11.8 1 each 0  . amLODipine (NORVASC) 10 MG tablet Take 1 tablet (10 mg total) by mouth daily. 90 tablet 2  . atorvastatin (LIPITOR) 40 MG tablet Take 1 tablet (40 mg total) by mouth daily. 90 tablet 1  . benazepril (LOTENSIN) 20 MG tablet Take 1 tablet by mouth daily. APPOINTMENT NEEDED FOR FURTHER REFILLS 7 tablet 0  . dapagliflozin propanediol (FARXIGA) 10 MG TABS tablet Take 10 mg by mouth daily. 90 tablet 1  . Dulaglutide (TRULICITY) 0.75 MG/0.5ML SOPN Inject 0.5 mLs into the skin once a week. 4 pen 11  . Dulaglutide (TRULICITY) 1.5 MG/0.5ML SOPN Inject 1.5 mg into the skin once a week. 12 pen 12  . insulin degludec (TRESIBA FLEXTOUCH) 100 UNIT/ML SOPN FlexTouch Pen Inject 0.1 mLs (10 Units total) into the skin daily. 5 pen 12  . Insulin Pen Needle (ADVOCATE INSULIN PEN NEEDLES) 33G X 4 MM MISC 1 each by Does not apply route daily.  Use with insulin pen daily 100 each 12  . metFORMIN (GLUCOPHAGE) 1000 MG tablet Take 1 tablet (1,000 mg total) by mouth 2 (two) times daily with a meal. 180 tablet 3  . phentermine (ADIPEX-P) 37.5 MG tablet One tab by mouth qAM 30 tablet 0  . tadalafil (CIALIS) 5 MG tablet Take 1 tablet (5 mg total) by mouth daily. 30 tablet 12  . tamsulosin (FLOMAX) 0.4 MG CAPS capsule Take 1 capsule (0.4 mg total) by mouth daily after breakfast. 90 capsule 3   No current facility-administered medications for this  visit.    No Known Allergies  Health Maintenance Health Maintenance  Topic Date Due  . OPHTHALMOLOGY EXAM  03/10/2018  . FOOT EXAM  12/16/2018  . HEMOGLOBIN A1C  01/18/2019  . Fecal DNA (Cologuard)  06/06/2019  . TETANUS/TDAP  12/10/2024  . INFLUENZA VACCINE  Completed  . PNEUMOCOCCAL POLYSACCHARIDE VACCINE AGE 32-64 HIGH RISK  Completed  . Hepatitis C Screening  Completed  . HIV Screening  Completed     Exam:  BP (!) 134/91   Pulse 94   Wt 237 lb (107.5 kg)   BMI 39.44 kg/m  Wt Readings from Last 5 Encounters:  12/16/18 237 lb (107.5 kg)  09/30/18 216 lb 6.4 oz (98.2 kg)  08/17/18 220 lb (99.8 kg)  07/18/18 225 lb (102.1 kg)  03/29/18 218 lb 3.2 oz (99 kg)      Gen: Well NAD HEENT: EOMI,  MMM Lungs: Normal work of breathing. CTABL Heart: RRR no MRG Abd: NABS, Soft. Nondistended, Nontender Exts: Brisk capillary refill, warm and well perfused.  Psych: Alert and oriented normal speech thought process and affect.  Depression screen [facility] 2/9 12/16/2018 07/18/2018 07/29/2017 07/15/2015 11/05/2014  Decreased Interest 0 0 0 0 0  Down, Depressed, Hopeless 0 0 0 0 0  PHQ - 2 Score 0 0 0 0 0  Altered sleeping 0 0 - - -  Tired, decreased energy 0 0 - - -  Change in appetite 0 0 - - -  Feeling bad or failure about yourself  0 0 - - -  Trouble concentrating 0 0 - - -  Moving slowly or fidgety/restless 0 0 - - -  Suicidal thoughts 0 0 - - -  PHQ-9 Score 0 0 - - -  Difficult doing work/chores Not difficult at all Not difficult at all - - -       Lab and Radiology Results Results for orders placed or performed in visit on 12/16/18 (from the past 72 hour(s))  POCT HgB A1C     Status: Abnormal   Collection Time: 12/16/18  9:00 AM  Result Value Ref Range   Hemoglobin A1C 12.9 (A) 4.0 - 5.6 %   HbA1c POC (<> result, manual entry)     HbA1c, POC (prediabetic range)     HbA1c, POC (controlled diabetic range)     No results found.    Assessment and Plan: 52 y.o. male  with well adult. Main issue today is obviously has uncontrolled diabetes with an A1c of 12.9.  Lengthy discussion regarding improved diet with decreased carbs.  Goal for 60 g of carbs per meal.  Additionally continue exercising. Medication changes.  Plan to increase Trulicity to 1.5 mg/week. Additionally will be starting long-acting insulin.  Will use [place].  Start at 10 units/day and titrate by 2 units every 2 to 3 days if blood sugar greater than 140 fasting. Prescribed glucometer as well. Recheck 1 month.  Hypertension:  A bit elevated today however reasonable to continue current regimen and recheck in the near future.  Obesity: As above will work on reduced calories and carbohydrates.  Continue exercising.  Health maintenance: We will obtain records from optometry regarding diabetic eye exam. Otherwise health maintenance is up-to-date.  Addendum: Diabetic eye exam received.  Date of service November 02, 2017.  No diabetic retinopathy. Result will be sent to scan.  PDMP reviewed during this encounter. Orders Placed This Encounter  Procedures  . POCT HgB A1C  . HM Diabetes Foot Exam   Meds ordered this encounter  Medications  . insulin degludec (TRESIBA FLEXTOUCH) 100 UNIT/ML SOPN FlexTouch Pen    Sig: Inject 0.1 mLs (10 Units total) into the skin daily.    Dispense:  5 pen    Refill:  12  . Insulin Pen Needle (ADVOCATE INSULIN PEN NEEDLES) 33G X 4 MM MISC    Sig: 1 each by Does not apply route daily. Use with insulin pen daily    Dispense:  100 each    Refill:  12  . AMBULATORY NON FORMULARY MEDICATION    Sig: Single glucometer with lancets, test strips. Test daily. E11.8    Dispense:  1 each    Refill:  0  . Dulaglutide (TRULICITY) 1.5 MG/0.5ML SOPN    Sig: Inject 1.5 mg into the skin once a week.    Dispense:  12 pen    Refill:  12     Discussed warning signs or symptoms. Please see discharge instructions. Patient expresses understanding.

## 2018-12-16 NOTE — Patient Instructions (Addendum)
Thank you for coming in today. Increase trulicity pen to 1.5mg  weekly.  You should get a new prescription.   Start insulin. Start at 10 units daily.  Increase by 2 units every 2 days the blood sugar is over 140.   Recheck in 1 month.    Return sooner if needed.   If you are having problems with your pen schedule a nurse visit.   Set carbs to 60g or less per meal.

## 2018-12-19 ENCOUNTER — Other Ambulatory Visit: Payer: Self-pay | Admitting: Family Medicine

## 2018-12-22 ENCOUNTER — Encounter: Payer: Self-pay | Admitting: Family Medicine

## 2019-01-16 ENCOUNTER — Encounter: Payer: Self-pay | Admitting: Family Medicine

## 2019-01-16 ENCOUNTER — Ambulatory Visit (INDEPENDENT_AMBULATORY_CARE_PROVIDER_SITE_OTHER): Payer: BLUE CROSS/BLUE SHIELD | Admitting: Family Medicine

## 2019-01-16 VITALS — BP 138/70 | HR 102 | Ht 65.0 in | Wt 239.0 lb

## 2019-01-16 DIAGNOSIS — E785 Hyperlipidemia, unspecified: Secondary | ICD-10-CM

## 2019-01-16 DIAGNOSIS — E118 Type 2 diabetes mellitus with unspecified complications: Secondary | ICD-10-CM | POA: Diagnosis not present

## 2019-01-16 DIAGNOSIS — I10 Essential (primary) hypertension: Secondary | ICD-10-CM

## 2019-01-16 DIAGNOSIS — E1169 Type 2 diabetes mellitus with other specified complication: Secondary | ICD-10-CM | POA: Diagnosis not present

## 2019-01-16 DIAGNOSIS — R29898 Other symptoms and signs involving the musculoskeletal system: Secondary | ICD-10-CM | POA: Diagnosis not present

## 2019-01-16 DIAGNOSIS — E119 Type 2 diabetes mellitus without complications: Secondary | ICD-10-CM | POA: Diagnosis not present

## 2019-01-16 DIAGNOSIS — E1159 Type 2 diabetes mellitus with other circulatory complications: Secondary | ICD-10-CM

## 2019-01-16 DIAGNOSIS — I152 Hypertension secondary to endocrine disorders: Secondary | ICD-10-CM

## 2019-01-16 MED ORDER — PHENTERMINE HCL 37.5 MG PO TABS: ORAL_TABLET | ORAL | 0 refills | Status: DC

## 2019-01-16 NOTE — Patient Instructions (Signed)
Thank you for coming in today. STOP glipizide.   You should hear from Dr Carlos Levering office.  Recheck in 2 months.  Work on low carbs.  Try to get fewer than 60g per meal of carbs.

## 2019-01-16 NOTE — Progress Notes (Signed)
Jordan Taylor is a 53 y.o. male who presents to [facility]: Primary Care Sports Medicine today for diabetes.  Jordan Taylor was started on insulin 1 month ago.  He was started at 10 units and titrated.  His blood sugars have been extremely well controlled with an average fasting blood sugar at around 100.  He had one episode of blood sugar at 66 but did not feel bad.  He is however still taking glipizide and cannot remember being asked to stop.  He is working with a Systems analyst exercising regularly and trying to eat a lower calorie diet but not eating a low-carb diet.  He is eating 2800 to 3000 cal a day with exercise.  He takes medications listed below for hypertension.  No chest pain palpitations shortness of breath.  He would like to restart phentermine if possible.  This is worked well in the past for him for weight loss.  Additionally he notes continued problems with his left hand.  He had left hand weakness due to cervical cord compression and cervical nerve root compression in 2017.  He had extensive decompression by Jordan Taylor in 2017.  He has had persistent problems with weakness with the extension of his left hand and fingers.  He is done that hand therapy some in the past and continues to slowed slight improvement.  He is able to extend his hand more fully but notes that he wonders if there is any potential for surgical procedures to improve his hand performance.  ROS as above:  Exam:  BP 138/70   Pulse (!) 102   Ht 5\' 5"  (1.651 m)   Wt 239 lb (108.4 kg)   BMI 39.77 kg/m  Wt Readings from Last 5 Encounters:  01/16/19 239 lb (108.4 kg)  12/16/18 237 lb (107.5 kg)  09/30/18 216 lb 6.4 oz (98.2 kg)  08/17/18 220 lb (99.8 kg)  07/18/18 225 lb (102.1 kg)    Gen: Well NAD HEENT: EOMI,  MMM Lungs: Normal work of breathing. CTABL Heart: RRR no MRG Abd: NABS, Soft. Nondistended,  Nontender Exts: Brisk capillary refill, warm and well perfused.  Left hand: Decreased muscle bulk intrinsic muscles left hand. Decreased extensor strength. Some flexor strength is present. Sensation is intact.  Lab and Radiology Results EXAM: MRI CERVICAL SPINE WITHOUT CONTRAST  TECHNIQUE: Multiplanar, multisequence MR imaging of the cervical spine was performed. No intravenous contrast was administered.  COMPARISON:  Report from radiographs from 10/24/2012  FINDINGS: Alignment: Reversal of the normal cervical lordosis, without significant subluxation.  Vertebrae: Type 1 degenerative endplate findings at C3-4, C4-5, and C6-7. Disc desiccation throughout the cervical spine with loss of cervical disc height most notable at C3-4 and C6-7. Prior posterior decompression extending from C3-4 down through C6-7.  Cord: 0.7 by 0.3 by 0.6 cm focus of abnormal T2 signal hyperintensity centrally in the cord at the C4-5 level. Abnormal linear bandlike left paracentral cord signal at C5-6 extending about 9 mm in length but very thin. In general there is parenchymal loss/ thinning of the cervical cord at the C5-6 level.  Posterior Fossa, vertebral arteries, paraspinal tissues: Unremarkable  Disc levels:  C2-3: Mild bilateral foraminal stenosis and mild central narrowing of the thecal sac due to uncinate and facet spurring and modest disc bulge.  C3-4: Severe bilateral foraminal stenosis and prominent central narrowing of the thecal sac due to disc osteophyte complex and uncinate spurring. AP diameter of the thecal sac 6 mm.  C4-5: Prominent left and moderate to prominent right foraminal stenosis with prominent central narrowing of the thecal sac due to central disc protrusion, uncinate spurring, disc bulge, and mild facet arthropathy. AP diameter of the thecal sac 6 mm.  C5-6: Moderate to prominent left and moderate right foraminal stenosis with prominent central  narrowing of the thecal sac due to central disc protrusion, disc bulge, and uncinate spurring. AP diameter of the thecal sac 6 mm.  C6-7: Prominent right and moderate to prominent left foraminal stenosis with prominent central narrowing of the thecal sac due to disc bulge, uncinate spurring, and facet arthropathy along with right foraminal disc protrusion. AP diameter of the thecal sac 6 mm.  C7-T1: Moderate to prominent right and moderate left foraminal stenosis due to uncinate and facet spurring. Mild central narrowing of the thecal sac noted due to disc bulge.  IMPRESSION: 1. Cervical spondylosis and degenerative disc disease, causing prominent impingement at C3-4, C4-5, and C6-7; moderate to prominent impingement at C5-6 and C7-T1; and mild impingement at C2-3, as detailed above. 2. Myelomalacia at C4-5 and C5-6, with thinning of the cervical cord at the C5-6 level.   Electronically Signed   By: Jordan RongWalter  Taylor M.D.   On: 07/07/2016 16:58  EXAM: CERVICAL SPINE - 2-3 VIEW; DG C-ARM 61-120 MIN  COMPARISON:  None.  FINDINGS: Two lateral view of the cervical spine submitted. There is anterior fusion with metallic plate and screws at C3-C4, C4-C5, C5-C6 and C6-C7 level. There is anatomic alignment. Postsurgical disc spacers noted at C3-C4, C4-C5, C5-C6 and C6-C7 level.  Fluoroscopy time was 3 seconds.  Please see the operative report.  IMPRESSION: Anterior fusion C3-C7 level with anatomic alignment. Please see the operative report   Electronically Signed   By: Jordan MeadLiviu  Taylor M.D.   On: 09/07/2016 12:52 I personally (independently) visualized and performed the interpretation of the images attached in this note.   Assessment and Plan: 53 y.o. male with  Diabetes: Significant improvement in blood sugar.  Stop glipizide.  Continue current regimen.  Decrease carbohydrates to 60 g per meal.  Recheck in 2 months.  May decrease insulin if blood sugars remain  low.  Hypertension: Blood pressure bit elevated today.  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  Obesity: Continue to work on exercise and improve diet.  Start phentermine again.  Left hand extensor weakness: Due to nerve injury however patient does have some contractures.  He may benefit from second opinion with hand surgery.  I am not optimistic about surgical management but it is reasonable to have a second look.  PDMP reviewed during this encounter. Orders Placed This Encounter  Procedures  . Ambulatory referral to Orthopedic Surgery    Referral Priority:   Routine    Referral Type:   Surgical    Referral Reason:   Specialty Services Required    Referred to Provider:   Dominica SeverinGramig, William, MD    Requested Specialty:   Orthopedic Surgery    Number of Visits Requested:   1   Meds ordered this encounter  Medications  . phentermine (ADIPEX-P) 37.5 MG tablet    Sig: One tab by mouth qAM    Dispense:  30 tablet    Refill:  0     Historical information moved to improve visibility of documentation.  Past Medical History:  Diagnosis Date  . Essential hypertension 12/04/2013  . Type 2 diabetes mellitus (HCC) 12/04/2013   Past Surgical History:  Procedure Laterality Date  . ANTERIOR  CERVICAL DECOMPRESSION/DISCECTOMY FUSION 4 LEVELS N/A 09/07/2016   Procedure: Anterior Cervical Diskectomy Fusion - Cervical three- Cervical four - Cervical four - Cervical five - Cervical five- Cervical six  - Cervical six - Cervical seven;  Surgeon: Julio Sicks, MD;  Location: [facility] OR;  Service: Neurosurgery;  Laterality: N/A;  . KNEE ARTHROSCOPY Left   . spinal cord surgery     20+ yrs ago   Social History   Tobacco Use  . Smoking status: Former Games developer  . Smokeless tobacco: Never Used  Substance Use Topics  . Alcohol use: No    Alcohol/week: 0.0 standard drinks   family history includes Alcohol abuse in his father and mother; Cancer in his mother and another family member; Diabetes in his  father; Hypertension in his father.  Medications: Current Outpatient Medications  Medication Sig Dispense Refill  . AMBULATORY NON FORMULARY MEDICATION 40ml syringe, 21g 1.5 inch needles and 18 gauge blunt filler needles for testosterone injections every 2 weeks. Disp qs x 3 months. 1 each 0  . AMBULATORY NON FORMULARY MEDICATION Single glucometer with lancets, test strips. Test daily. E11.8 1 each 0  . amLODipine (NORVASC) 10 MG tablet Take 1 tablet (10 mg total) by mouth daily. 90 tablet 2  . atorvastatin (LIPITOR) 40 MG tablet Take 1 tablet (40 mg total) by mouth daily. 90 tablet 1  . benazepril (LOTENSIN) 20 MG tablet Take 1 tablet by mouth daily. 90 tablet 3  . dapagliflozin propanediol (FARXIGA) 10 MG TABS tablet Take 10 mg by mouth daily. 90 tablet 1  . Dulaglutide (TRULICITY) 1.5 MG/0.5ML SOPN Inject 1.5 mg into the skin once a week. 12 pen 12  . insulin degludec (TRESIBA FLEXTOUCH) 100 UNIT/ML SOPN FlexTouch Pen Inject 0.1 mLs (10 Units total) into the skin daily. 5 pen 12  . Insulin Pen Needle (ADVOCATE INSULIN PEN NEEDLES) 33G X 4 MM MISC 1 each by Does not apply route daily. Use with insulin pen daily 100 each 12  . metFORMIN (GLUCOPHAGE) 1000 MG tablet Take 1 tablet (1,000 mg total) by mouth 2 (two) times daily with a meal. 180 tablet 3  . phentermine (ADIPEX-P) 37.5 MG tablet One tab by mouth qAM 30 tablet 0  . tadalafil (CIALIS) 5 MG tablet Take 1 tablet (5 mg total) by mouth daily. 30 tablet 12  . tamsulosin (FLOMAX) 0.4 MG CAPS capsule Take 1 capsule (0.4 mg total) by mouth daily after breakfast. 90 capsule 3   No current facility-administered medications for this visit.    No Known Allergies   Discussed warning signs or symptoms. Please see discharge instructions. Patient expresses understanding.

## 2019-01-20 ENCOUNTER — Other Ambulatory Visit: Payer: Self-pay | Admitting: Family Medicine

## 2019-02-15 ENCOUNTER — Encounter: Payer: Self-pay | Admitting: Family Medicine

## 2019-02-15 ENCOUNTER — Other Ambulatory Visit: Payer: Self-pay

## 2019-02-15 ENCOUNTER — Ambulatory Visit (INDEPENDENT_AMBULATORY_CARE_PROVIDER_SITE_OTHER): Payer: BLUE CROSS/BLUE SHIELD | Admitting: Family Medicine

## 2019-02-15 VITALS — BP 133/70 | HR 102 | Wt 229.0 lb

## 2019-02-15 DIAGNOSIS — E1159 Type 2 diabetes mellitus with other circulatory complications: Secondary | ICD-10-CM

## 2019-02-15 DIAGNOSIS — E118 Type 2 diabetes mellitus with unspecified complications: Secondary | ICD-10-CM

## 2019-02-15 DIAGNOSIS — I10 Essential (primary) hypertension: Secondary | ICD-10-CM

## 2019-02-15 DIAGNOSIS — I152 Hypertension secondary to endocrine disorders: Secondary | ICD-10-CM

## 2019-02-15 MED ORDER — PHENTERMINE HCL 37.5 MG PO TABS: ORAL_TABLET | ORAL | 0 refills | Status: DC

## 2019-02-15 NOTE — Progress Notes (Signed)
Jordan Taylor is a 53 y.o. male who presents to [name]: Primary Care Sports Medicine today for follow-up hypertension obesity and diabetes.  Randie has been taking phentermine for obesity.  Coupled with daily exercise he is managed to lose 10 pounds.  He tolerates medication well and is happy how things are going.  Unfortunately his gym has closed due to COVID-19 endemic.  He is working on ways to get exercises at home.   He takes medications listed below for blood pressure.  His blood pressures are typically pretty well controlled at home in the 120s.  No chest pain palpitations or shortness of breath.  His diabetes is improving as well.  His fasting sugars are typically in the 110.  He feels well otherwise.  No hypoglycemic episodes.  ROS as above:  Exam:  BP 133/70   Pulse (!) 102   Wt 229 lb (103.9 kg)   BMI 38.11 kg/m  Wt Readings from Last 5 Encounters:  02/15/19 229 lb (103.9 kg)  01/16/19 239 lb (108.4 kg)  12/16/18 237 lb (107.5 kg)  09/30/18 216 lb 6.4 oz (98.2 kg)  08/17/18 220 lb (99.8 kg)    Gen: Well NAD HEENT: EOMI,  MMM Lungs: Normal work of breathing. CTABL Heart: RRR no MRG heart rate 90 bpm per my check. Abd: NABS, Soft. Nondistended, Nontender Exts: Brisk capillary refill, warm and well perfused.   Lab and Radiology Results No results found for this or any previous visit (from the past 72 hour(s)). No results found.    Assessment and Plan: 53 y.o. male with obesity: Weight loss with phentermine diet and exercise.  Continue current regimen and recheck in 1 month for diabetes recheck.  Hypertension: Blood pressure little bit elevated in clinic but better controlled at home.  Continue current regimen and recheck in 1 month.  Diabetes: Provement with lifestyle change.  Continue current medical regimen and recheck in 1 month for A1c recheck.  PDMP not  reviewed this encounter. No orders of the defined types were placed in this encounter.  Meds ordered this encounter  Medications  . phentermine (ADIPEX-P) 37.5 MG tablet    Sig: One tab by mouth qAM    Dispense:  30 tablet    Refill:  0     Historical information moved to improve visibility of documentation.  Past Medical History:  Diagnosis Date  . Essential hypertension 12/04/2013  . Type 2 diabetes mellitus (HCC) 12/04/2013   Past Surgical History:  Procedure Laterality Date  . ANTERIOR CERVICAL DECOMPRESSION/DISCECTOMY FUSION 4 LEVELS N/A 09/07/2016   Procedure: Anterior Cervical Diskectomy Fusion - Cervical three- Cervical four - Cervical four - Cervical five - Cervical five- Cervical six  - Cervical six - Cervical seven;  Surgeon: Julio Sicks, MD;  Location: [facility] OR;  Service: Neurosurgery;  Laterality: N/A;  . KNEE ARTHROSCOPY Left   . spinal cord surgery     20+ yrs ago   Social History   Tobacco Use  . Smoking status: Former Games developer  . Smokeless tobacco: Never Used  Substance Use Topics  . Alcohol use: No    Alcohol/week: 0.0 standard drinks   family history includes Alcohol abuse in his father and mother; Cancer in his mother and another family member; Diabetes in his father; Hypertension in his father.  Medications: Current Outpatient Medications  Medication Sig Dispense Refill  . AMBULATORY NON FORMULARY MEDICATION 3ml syringe, 21g 1.5 inch needles and 18 gauge blunt filler  needles for testosterone injections every 2 weeks. Disp qs x 3 months. 1 each 0  . AMBULATORY NON FORMULARY MEDICATION Single glucometer with lancets, test strips. Test daily. E11.8 1 each 0  . amLODipine (NORVASC) 10 MG tablet Take 1 tablet (10 mg total) by mouth daily. 90 tablet 2  . atorvastatin (LIPITOR) 40 MG tablet TAKE 1 TABLET BY MOUTH EVERY DAY 90 tablet 1  . benazepril (LOTENSIN) 20 MG tablet Take 1 tablet by mouth daily. 90 tablet 3  . dapagliflozin propanediol (FARXIGA) 10 MG TABS tablet  Take 10 mg by mouth daily. 90 tablet 1  . Dulaglutide (TRULICITY) 1.5 MG/0.5ML SOPN Inject 1.5 mg into the skin once a week. 12 pen 12  . insulin degludec (TRESIBA FLEXTOUCH) 100 UNIT/ML SOPN FlexTouch Pen Inject 0.1 mLs (10 Units total) into the skin daily. 5 pen 12  . Insulin Pen Needle (ADVOCATE INSULIN PEN NEEDLES) 33G X 4 MM MISC 1 each by Does not apply route daily. Use with insulin pen daily 100 each 12  . metFORMIN (GLUCOPHAGE) 1000 MG tablet Take 1 tablet (1,000 mg total) by mouth 2 (two) times daily with a meal. 180 tablet 3  . phentermine (ADIPEX-P) 37.5 MG tablet One tab by mouth qAM 30 tablet 0  . tadalafil (CIALIS) 5 MG tablet Take 1 tablet (5 mg total) by mouth daily. 30 tablet 12  . tamsulosin (FLOMAX) 0.4 MG CAPS capsule Take 1 capsule (0.4 mg total) by mouth daily after breakfast. 90 capsule 3   No current facility-administered medications for this visit.    No Known Allergies   Discussed warning signs or symptoms. Please see discharge instructions. Patient expresses understanding.

## 2019-02-15 NOTE — Patient Instructions (Addendum)
Thank you for coming in today. Recheck in 1 month for DM.  Keep working on fitness and diet.  Consider home personal trainer.

## 2019-02-15 NOTE — Progress Notes (Signed)
Patient is here for blood pressure and weight check. Denies any trouble sleeping, palpitations, or any other medication problems. Patient has lost weight. PCP would like to go over BP while in office. Patient placed in room for Provider to see.

## 2019-02-16 DIAGNOSIS — R29898 Other symptoms and signs involving the musculoskeletal system: Secondary | ICD-10-CM | POA: Diagnosis not present

## 2019-02-16 DIAGNOSIS — M5412 Radiculopathy, cervical region: Secondary | ICD-10-CM | POA: Diagnosis not present

## 2019-02-17 DIAGNOSIS — M5412 Radiculopathy, cervical region: Secondary | ICD-10-CM | POA: Diagnosis not present

## 2019-02-25 ENCOUNTER — Encounter (INDEPENDENT_AMBULATORY_CARE_PROVIDER_SITE_OTHER): Payer: BLUE CROSS/BLUE SHIELD | Admitting: Family Medicine

## 2019-02-25 DIAGNOSIS — H1011 Acute atopic conjunctivitis, right eye: Secondary | ICD-10-CM | POA: Diagnosis not present

## 2019-02-27 MED ORDER — POLYMYXIN B-TRIMETHOPRIM 10000-0.1 UNIT/ML-% OP SOLN: 1.0000 [drp] | OPHTHALMIC | 0 refills | Status: DC

## 2019-02-27 NOTE — Telephone Encounter (Signed)
Patient sent me a message this morning complaining of right I be in red and with some discharge.  He denies any significant eye pain.  Suspect allergic conjunctivitis.  Plan to use Zaditor eyedrops along with Zyrtec.  Backup prescription of Polytrim antibiotic eyedrops sent to pharmacy.  Recheck via WebEx visit if not improved.  Total time spent 11 minutes

## 2019-03-02 ENCOUNTER — Other Ambulatory Visit: Payer: Self-pay | Admitting: Family Medicine

## 2019-03-14 ENCOUNTER — Encounter: Payer: Self-pay | Admitting: Family Medicine

## 2019-03-14 DIAGNOSIS — R29898 Other symptoms and signs involving the musculoskeletal system: Secondary | ICD-10-CM | POA: Diagnosis not present

## 2019-03-15 ENCOUNTER — Encounter: Payer: Self-pay | Admitting: Family Medicine

## 2019-03-15 ENCOUNTER — Ambulatory Visit (INDEPENDENT_AMBULATORY_CARE_PROVIDER_SITE_OTHER): Payer: BLUE CROSS/BLUE SHIELD | Admitting: Family Medicine

## 2019-03-15 ENCOUNTER — Other Ambulatory Visit: Payer: Self-pay

## 2019-03-15 ENCOUNTER — Telehealth: Payer: Self-pay | Admitting: Family Medicine

## 2019-03-15 VITALS — BP 120/73 | Wt 226.0 lb

## 2019-03-15 DIAGNOSIS — E1159 Type 2 diabetes mellitus with other circulatory complications: Secondary | ICD-10-CM | POA: Diagnosis not present

## 2019-03-15 DIAGNOSIS — I10 Essential (primary) hypertension: Secondary | ICD-10-CM

## 2019-03-15 DIAGNOSIS — E785 Hyperlipidemia, unspecified: Secondary | ICD-10-CM

## 2019-03-15 DIAGNOSIS — E118 Type 2 diabetes mellitus with unspecified complications: Secondary | ICD-10-CM | POA: Diagnosis not present

## 2019-03-15 DIAGNOSIS — E1169 Type 2 diabetes mellitus with other specified complication: Secondary | ICD-10-CM | POA: Diagnosis not present

## 2019-03-15 DIAGNOSIS — I152 Hypertension secondary to endocrine disorders: Secondary | ICD-10-CM

## 2019-03-15 MED ORDER — PHENTERMINE HCL 37.5 MG PO TABS: ORAL_TABLET | ORAL | 0 refills | Status: DC

## 2019-03-15 MED ORDER — DOXYCYCLINE HYCLATE 100 MG PO TABS: 100.0000 mg | ORAL_TABLET | Freq: Two times a day (BID) | ORAL | 0 refills | Status: DC

## 2019-03-15 NOTE — Progress Notes (Addendum)
Virtual Visit  via Video Note  I connected with      Jordan Taylor  by a video enabled telemedicine application and verified that I am speaking with the correct person using two identifiers.   I discussed the limitations of evaluation and management by telemedicine and the availability of in person appointments. The patient expressed understanding and agreed to proceed.  History of Present Illness: Jordan Taylor is a 53 y.o. male who would like to discuss penile discharge.  He notes that starting about 2 days ago he developed a little bit of penile discharge and itching and irritation.  He has had this before and his symptoms are consistent with previous episodes.  He denies any significant life changes.  He notes that his girlfriend had a Mirena removed about 3 days ago but no other changes.  She does not have any discharge or irritation.  He denies any significant urinary symptoms fevers or chills.  Additionally he is currently taking phentermine for management of his obesity.  He finds it to be very helpful and he is continuing to lose weight slowly in conjunction with lifestyle change and diet modification.  He denies chest pain palpitations or shortness of breath or other obnoxious side effects.  He also has diabetes.  This is managed with medications listed below.  He notes his blood sugars are typically in the 130s or less in the mornings.  He is very happy with how things are going and is tolerating his medications.  He takes atorvastatin daily for hyperlipidemia and tolerates it well.  120/73 BS 130 Wt 226    Observations/Objective: BP 120/73   Wt 226 lb (102.5 kg)   BMI 37.61 kg/m  Wt Readings from Last 5 Encounters:  03/15/19 226 lb (102.5 kg)  02/15/19 229 lb (103.9 kg)  01/16/19 239 lb (108.4 kg)  12/16/18 237 lb (107.5 kg)  09/30/18 216 lb 6.4 oz (98.2 kg)   Exam: Appearance nontoxic no acute distress Normal Speech.  No shortness of breath tachypnea  wheezing.  Normal motion.  Normal facial motion.  Normal speech thought process and affect.  Lab and Radiology Results Lab Results  Component Value Date   HGBA1C 12.9 (A) 12/16/2018     Assessment and Plan: 53 y.o. male with  Penile discharge.  Recurrent problem.  Previous cultures have never come back positive and I suspect this is more irritation than anything else.  We discussed some options.  Plan for trial of topical over-the-counter antifungal cream for vaginal yeast infection as that may help.  Additionally will use short course of oral doxycycline as that may help as well however it has not been infectious previously.  Watchful waiting and recheck if not improving.  Obviously ability to fully evaluate this problem is limited via video conferencing software however risk of an office visit during COVID-19 pandemic likely exceeds benefit.  Diabetes: Significant improvement in fasting sugars at home with medication and lifestyle change.  Patient will be due for A1c anytime after April 17.  Plan for basic labs listed below with reassessment next week if needed.  Hypertension: Blood pressure better controlled.  Continue current regimen.  Check fasting labs below.  Obesity: Doing well tolerating phentermine.  Continue current regimen.  Hyperlipidemia: Historically doing pretty well.  Check fasting labs including lipid panel.  Recheck 3 months or sooner if needed.   PDMP reviewed during this encounter. Orders Placed This Encounter  Procedures  . CBC  . COMPLETE METABOLIC PANEL  WITH GFR  . Hemoglobin A1c  . Lipid Panel w/reflex Direct LDL   Meds ordered this encounter  Medications  . doxycycline (VIBRA-TABS) 100 MG tablet    Sig: Take 1 tablet (100 mg total) by mouth 2 (two) times daily.    Dispense:  14 tablet    Refill:  0  . phentermine (ADIPEX-P) 37.5 MG tablet    Sig: One tab by mouth qAM    Dispense:  30 tablet    Refill:  0    Follow Up Instructions:    I  discussed the assessment and treatment plan with the patient. The patient was provided an opportunity to ask questions and all were answered. The patient agreed with the plan and demonstrated an understanding of the instructions.   The patient was advised to call back or seek an in-person evaluation if the symptoms worsen or if the condition fails to improve as anticipated.  I provided 25 minutes of non-face-to-face time during this encounter.    Historical information moved to improve visibility of documentation.  Past Medical History:  Diagnosis Date  . Essential hypertension 12/04/2013  . Type 2 diabetes mellitus (HCC) 12/04/2013   Past Surgical History:  Procedure Laterality Date  . ANTERIOR CERVICAL DECOMPRESSION/DISCECTOMY FUSION 4 LEVELS N/A 09/07/2016   Procedure: Anterior Cervical Diskectomy Fusion - Cervical three- Cervical four - Cervical four - Cervical five - Cervical five- Cervical six  - Cervical six - Cervical seven;  Surgeon: Julio Sicks, MD;  Location: [facility] OR;  Service: Neurosurgery;  Laterality: N/A;  . KNEE ARTHROSCOPY Left   . spinal cord surgery     20+ yrs ago   Social History   Tobacco Use  . Smoking status: Former Games developer  . Smokeless tobacco: Never Used  Substance Use Topics  . Alcohol use: No    Alcohol/week: 0.0 standard drinks   family history includes Alcohol abuse in his father and mother; Cancer in his mother and another family member; Diabetes in his father; Hypertension in his father.  Medications: Current Outpatient Medications  Medication Sig Dispense Refill  . AMBULATORY NON FORMULARY MEDICATION 3ml syringe, 21g 1.5 inch needles and 18 gauge blunt filler needles for testosterone injections every 2 weeks. Disp qs x 3 months. 1 each 0  . AMBULATORY NON FORMULARY MEDICATION Single glucometer with lancets, test strips. Test daily. E11.8 1 each 0  . amLODipine (NORVASC) 10 MG tablet Take 1 tablet (10 mg total) by mouth daily. 90 tablet 2  . atorvastatin  (LIPITOR) 40 MG tablet TAKE 1 TABLET BY MOUTH EVERY DAY 90 tablet 1  . benazepril (LOTENSIN) 20 MG tablet Take 1 tablet by mouth daily. 90 tablet 3  . dapagliflozin propanediol (FARXIGA) 10 MG TABS tablet Take 10 mg by mouth daily. 90 tablet 1  . doxycycline (VIBRA-TABS) 100 MG tablet Take 1 tablet (100 mg total) by mouth 2 (two) times daily. 14 tablet 0  . insulin degludec (TRESIBA FLEXTOUCH) 100 UNIT/ML SOPN FlexTouch Pen Inject 0.1 mLs (10 Units total) into the skin daily. 5 pen 12  . Insulin Pen Needle (ADVOCATE INSULIN PEN NEEDLES) 33G X 4 MM MISC 1 each by Does not apply route daily. Use with insulin pen daily 100 each 12  . metFORMIN (GLUCOPHAGE) 1000 MG tablet Take 1 tablet (1,000 mg total) by mouth 2 (two) times daily with a meal. 180 tablet 3  . phentermine (ADIPEX-P) 37.5 MG tablet One tab by mouth qAM 30 tablet 0  . tadalafil (CIALIS) 5 MG  tablet Take 1 tablet (5 mg total) by mouth daily. 30 tablet 12  . tamsulosin (FLOMAX) 0.4 MG CAPS capsule Take 1 capsule (0.4 mg total) by mouth daily after breakfast. 90 capsule 3  . TRULICITY 1.5 MG/0.5ML SOPN INJECT 1.5 MG INTO THE SKIN ONCE A WEEK. 6 pen 5   No current facility-administered medications for this visit.    No Known Allergies                                                                 Addendum to correct incorrect date due to templating error

## 2019-03-15 NOTE — Telephone Encounter (Signed)
Left message with information below and for patient to call us back to schedule an appointment or webex. °

## 2019-03-15 NOTE — Telephone Encounter (Signed)
-----   Message from Rodolph Bong, MD sent at 03/15/2019  9:44 AM EDT ----- Regarding: Please call to schedule today Please call Jordan Taylor to schedule a WebEx visit with me today to discuss his private acute medical problem. See Mychart notes from this morning.  Clayburn Pert

## 2019-03-15 NOTE — Patient Instructions (Addendum)
Thank you for coming in today.  Get and apply topically the cream usually used for vaginal yeast infections like the generic monistat (Clotrimazole etc)  I will also prescribe doxycycline antibiotic pills to take twice daily for 1 week.  If not better we may have your swing by the labs and provide a urine same.    Get labs as early as Monday.   I will send you results.  We will schedule Diabetes follow up for about 3 months.  My office will call you today or tomorrow to schedule the follow up.

## 2019-03-17 ENCOUNTER — Encounter: Payer: Self-pay | Admitting: Family Medicine

## 2019-03-17 MED ORDER — CEFDINIR 300 MG PO CAPS: 300.0000 mg | ORAL_CAPSULE | Freq: Two times a day (BID) | ORAL | 0 refills | Status: DC

## 2019-03-19 ENCOUNTER — Encounter: Payer: Self-pay | Admitting: Family Medicine

## 2019-03-20 ENCOUNTER — Encounter: Payer: Self-pay | Admitting: Family Medicine

## 2019-03-20 ENCOUNTER — Ambulatory Visit (INDEPENDENT_AMBULATORY_CARE_PROVIDER_SITE_OTHER): Payer: BLUE CROSS/BLUE SHIELD | Admitting: Family Medicine

## 2019-03-20 VITALS — BP 146/110 | HR 98 | Temp 98.0°F | Wt 227.0 lb

## 2019-03-20 DIAGNOSIS — N410 Acute prostatitis: Secondary | ICD-10-CM

## 2019-03-20 LAB — POCT URINALYSIS DIPSTICK
Bilirubin, UA: NEGATIVE
Glucose, UA: POSITIVE — AB
Ketones, UA: NEGATIVE
Leukocytes, UA: NEGATIVE
Nitrite, UA: NEGATIVE
Protein, UA: NEGATIVE
Spec Grav, UA: 1.015 (ref 1.010–1.025)
Urobilinogen, UA: 0.2 E.U./dL
pH, UA: 5.5 (ref 5.0–8.0)

## 2019-03-20 MED ORDER — CIPROFLOXACIN HCL 500 MG PO TABS: 500.0000 mg | ORAL_TABLET | Freq: Two times a day (BID) | ORAL | 0 refills | Status: DC

## 2019-03-20 NOTE — Patient Instructions (Addendum)
Thank you for coming in today.  Switch to cipro twice daily.   I think you have prostatitis.    Prostatitis  Prostatitis is swelling or inflammation of the prostate gland. The prostate is a walnut-sized gland that is involved in the production of semen. It is located below a man's bladder, in front of the rectum. There are four types of prostatitis:  Chronic nonbacterial prostatitis. This is the most common type of prostatitis. It may be associated with a viral infection or autoimmune disorder.  Acute bacterial prostatitis. This is the least common type of prostatitis. It starts quickly and is usually associated with a bladder infection, high fever, and shaking chills. It can occur at any age.  Chronic bacterial prostatitis. This type usually results from acute bacterial prostatitis that happens repeatedly (is recurrent) or has not been treated properly. It can occur in men of any age but is most common among middle-aged men whose prostate has begun to get larger. The symptoms are not as severe as symptoms caused by acute bacterial prostatitis.  Prostatodynia or chronic pelvic pain syndrome (CPPS). This type is also called pelvic floor disorder. It is associated with increased muscular tone in the pelvis surrounding the prostate. What are the causes? Bacterial prostatitis is caused by infection from bacteria. Chronic nonbacterial prostatitis may be caused by:  Urinary tract infections (UTIs).  Nerve damage.  A response by the body's disease-fighting system (autoimmune response).  Chemicals in the urine. The causes of the other types of prostatitis are usually not known. What are the signs or symptoms? Symptoms of this condition vary depending upon the type of prostatitis. If you have acute bacterial prostatitis, you may experience:  Urinary symptoms, such as: ? Painful urination. ? Burning during urination. ? Frequent and sudden urges to urinate. ? Inability to start urinating.  ? A weak or interrupted stream of urine.  Vomiting.  Nausea.  Fever.  Chills.  Inability to empty the bladder completely.  Pain in the: ? Muscles or joints. ? Lower back. ? Lower abdomen. If you have any of the other types of prostatitis, you may experience:  Urinary symptoms, such as: ? Sudden urges to urinate. ? Frequent urination. ? Difficulty starting urination. ? Weak urine stream. ? Dribbling after urination.  Discharge from the urethra. The urethra is a tube that opens at the end of the penis.  Pain in the: ? Testicles. ? Penis or tip of the penis. ? Rectum. ? Area in front of the rectum and below the scrotum (perineum).  Problems with sexual function.  Painful ejaculation.  Bloody semen. How is this diagnosed? This condition may be diagnosed based on:  A physical and medical exam.  Your symptoms.  A urine test to check for bacteria.  An exam in which a health care provider uses a finger to feel the prostate (digital rectal exam).  A test of a sample of semen.  Blood tests.  Ultrasound.  Removal of prostate tissue to be examined under a microscope (biopsy).  Tests to check how your body handles urine (urodynamic tests).  A test to look inside your bladder or urethra (cystoscopy). How is this treated? Treatment for this condition depends on the type of prostatitis. Treatment may involve:  Medicines to relieve pain or inflammation.  Medicines to help relax your muscles.  Physical therapy.  Heat therapy.  Techniques to help you control certain body functions (biofeedback).  Relaxation exercises.  Antibiotic medicine, if your condition is caused by bacteria.  Warm water baths (sitz baths). Sitz baths help with relaxing your pelvic floor muscles, which helps to relieve pressure on the prostate. Follow these instructions at home:   Take over-the-counter and prescription medicines only as told by your health care provider.  If you  were prescribed an antibiotic, take it as told by your health care provider. Do not stop taking the antibiotic even if you start to feel better.  If physical therapy, biofeedback, or relaxation exercises were prescribed, do exercises as instructed.  Take sitz baths as directed by your health care provider. For a sitz bath, sit in warm water that is deep enough to cover your hips and buttocks.  Keep all follow-up visits as told by your health care provider. This is important. Contact a health care provider if:  Your symptoms get worse.  You have a fever. Get help right away if:  You have chills.  You feel nauseous.  You vomit.  You feel light-headed or feel like you are going to faint.  You are unable to urinate.  You have blood or blood clots in your urine. This information is not intended to replace advice given to you by your health care provider. Make sure you discuss any questions you have with your health care provider. Document Released: 11/13/2000 Document Revised: 01/29/2018 Document Reviewed: 08/06/2016 Elsevier Interactive Patient Education  2019 [employer]   Ciprofloxacin tablets What is this medicine? CIPROFLOXACIN (sip roe FLOX a sin) is a quinolone antibiotic. It is used to treat certain kinds of bacterial infections. It will not work for colds, flu, or other viral infections. This medicine may be used for other purposes; ask your health care provider or pharmacist if you have questions. COMMON BRAND NAME(S): Cipro What should I tell my health care provider before I take this medicine? They need to know if you have any of these conditions: -bone problems -diabetes -heart disease -high blood pressure -history of irregular heartbeat -history of low levels of potassium in the blood -joint problems -kidney disease -liver disease -mental illness -myasthenia gravis -seizures -tendon problems -tingling of the fingers or toes, or other nerve disorder -an  unusual or allergic reaction to ciprofloxacin, other antibiotics or medicines, foods, dyes, or preservatives -pregnant or trying to get pregnant -breast-feeding How should I use this medicine? Take this medicine by mouth with a full glass of water. Follow the directions on the prescription label. You can take it with or without food. If it upsets your stomach, take it with food. Take your medicine at regular intervals. Do not take your medicine more often than directed. Take all of your medicine as directed even if you think you are better. Do not skip doses or stop your medicine early. Avoid antacids, aluminum, calcium, iron, magnesium, and zinc products for 6 hours before and 2 hours after taking a dose of this medicine. A special MedGuide will be given to you by the pharmacist with each prescription and refill. Be sure to read this information carefully each time. Talk to your pediatrician regarding the use of this medicine in children. Special care may be needed. Overdosage: If you think you have taken too much of this medicine contact a poison control center or emergency room at once. NOTE: This medicine is only for you. Do not share this medicine with others. What if I miss a dose? If you miss a dose, take it as soon as you can. If it is almost time for your next dose, take only that dose.  Do not take double or extra doses. What may interact with this medicine? Do not take this medicine with any of the following medications: -cisapride -dofetilide -dronedarone -flibanserin -lomitapide -pimozide -thioridazine -tizanidine -ziprasidone This medicine may also interact with the following medications: -antacids -birth control pills -caffeine -certain medicines for diabetes, like glipizide, glyburide, or insulin -certain medicines that treat or prevent blood clots like warfarin -clozapine -cyclosporine -didanosine buffered tablets or powder -duloxetine -lanthanum carbonate -lidocaine  -methotrexate -multivitamins -NSAIDS, medicines for pain and inflammation, like ibuprofen or naproxen -olanzapine -omeprazole -other medicines that prolong the QT interval (cause an abnormal heart rhythm) -phenytoin -probenecid -ropinirole -sevelamer -sildenafil -sucralfate -theophylline -zolpidem This list may not describe all possible interactions. Give your health care provider a list of all the medicines, herbs, non-prescription drugs, or dietary supplements you use. Also tell them if you smoke, drink alcohol, or use illegal drugs. Some items may interact with your medicine. What should I watch for while using this medicine? Tell your doctor or healthcare professional if your symptoms do not start to get better or if they get worse. Do not treat diarrhea with over the counter products. Contact your doctor if you have diarrhea that lasts more than 2 days or if it is severe and watery. Check with your doctor or health care professional if you get an attack of severe diarrhea, nausea and vomiting, or if you sweat a lot. The loss of too much body fluid can make it dangerous for you to take this medicine. This medicine may affect blood sugar levels. If you have diabetes, check with your doctor or health care professional before you change your diet or the dose of your diabetic medicine. You may get drowsy or dizzy. Do not drive, use machinery, or do anything that needs mental alertness until you know how this medicine affects you. Do not sit or stand up quickly, especially if you are an older patient. This reduces the risk of dizzy or fainting spells. This medicine can make you more sensitive to the sun. Keep out of the sun. If you cannot avoid being in the sun, wear protective clothing and use a sunscreen. Do not use sun lamps or tanning beds/booths. What side effects may I notice from receiving this medicine? Side effects that you should report to your doctor or health care professional as  soon as possible: -allergic reactions like skin rash or hives, swelling of the face, lips, or tongue -anxious -bloody or watery diarrhea -confusion -depressed mood -fast, irregular heartbeat -fever -hallucination, loss of contact with reality -joint, muscle, or tendon pain or swelling -loss of memory -pain, tingling, numbness in the hands or feet -seizures -signs and symptoms of aortic dissection such as sudden chest, stomach, or back pain -signs and symptoms of high blood sugar such as dizziness; dry mouth; dry skin; fruity breath; nausea; stomach pain; increased hunger or thirst; increased urination -signs and symptoms of liver injury like dark yellow or brown urine; general ill feeling or flu-like symptoms; light-colored stools; loss of appetite; nausea; right upper belly pain; unusually weak or tired; yellowing of the eyes or skin -signs and symptoms of low blood sugar such as feeling anxious; confusion; dizziness; increased hunger; unusually weak or tired; sweating; shakiness; cold; irritable; headache; blurred vision; fast heartbeat; loss of consciousness; pale skin -suicidal thoughts or other mood changes -sunburn -unusually weak or tired Side effects that usually do not require medical attention (report to your doctor or health care professional if they continue or are bothersome): -dry  mouth -headache -nausea -trouble sleeping This list may not describe all possible side effects. Call your doctor for medical advice about side effects. You may report side effects to FDA at 1-800-FDA-1088. Where should I keep my medicine? Keep out of the reach of children. Store at room temperature below 30 degrees C (86 degrees F). Keep container tightly closed. Throw away any unused medicine after the expiration date. NOTE: This sheet is a summary. It may not cover all possible information. If you have questions about this medicine, talk to your doctor, pharmacist, or health care provider.   2019 Elsevier/Gold Standard (2018-07-06 15:54:38)

## 2019-03-20 NOTE — Progress Notes (Signed)
Jordan Taylor is a 53 y.o. male who presents to [name] today for pain in penis.   Jyron started having urinary symptoms starting on the 13th. Treated wirh Doxy and Omnicef.  Notes this has not helped. He has difficulty starting urination and often has to strain to urinate.  He is also having frequency and urgency and a sensation of discomfort in the tip of his penis.  He denies any penile discharge.  He had similar symptoms last year with urology evaluation that never really was conclusive.  He denies any significant change in activity.  No fevers or chills.  ROS as above:  Exam:  BP (!) 146/110   Pulse 98   Temp 98 F (36.7 C) (Oral)   Wt 227 lb (103 kg)   BMI 37.77 kg/m  Wt Readings from Last 5 Encounters:  03/20/19 227 lb (103 kg)  03/15/19 226 lb (102.5 kg)  02/15/19 229 lb (103.9 kg)  01/16/19 239 lb (108.4 kg)  12/16/18 237 lb (107.5 kg)    Gen: Well NAD HEENT: EOMI,  MMM Lungs: Normal work of breathing. CTABL Heart: RRR no MRG Abd: NABS, Soft. Nondistended, Nontender Exts: Brisk capillary refill, warm and well perfused.  Genitals: Normal-appearing penis without discharge.  No lesions.  Testicles descended bilaterally and nontender. Rectal exam deflated external hemorrhoid present nontender.  Prostate is enlarged and tender without nodules.  Pressure on palpation on prostate reproduces symptoms.  Lab and Radiology Results Results for orders placed or performed in visit on 03/20/19 (from the past 72 hour(s))  POCT Urinalysis Dipstick     Status: Abnormal (In process)   Collection Time: 03/20/19  1:29 PM  Result Value Ref Range   Color, UA clear    Clarity, UA clear    Glucose, UA Positive (A) Negative    Comment: 500   Bilirubin, UA neg    Ketones, UA neg    Spec Grav, UA 1.015 1.010 - 1.025   Blood, UA trace    pH, UA 5.5 5.0 -  8.0   Protein, UA Negative Negative   Urobilinogen, UA 0.2 0.2 or 1.0 E.U./dL   Nitrite, UA neg    Leukocytes, UA Negative Negative   Appearance     Odor     No results found.    Assessment and Plan: 53 y.o. male with urinary frequency and urgency as well as straining.  Symptoms consistent with prostatitis.  Empiric treatment with Cipro.  If not improving neck step would be referral to urology.  Urine culture pending.  PDMP not reviewed this encounter. Orders Placed This Encounter  Procedures  . Urine Culture  . POCT Urinalysis Dipstick   Meds ordered this encounter  Medications  . DISCONTD: ciprofloxacin (CIPRO) 500 MG tablet    Sig: Take 1 tablet (500 mg total) by mouth 2 (two) times daily.    Dispense:  28 tablet    Refill:  0  . ciprofloxacin (CIPRO) 500 MG tablet    Sig: Take 1 tablet (500 mg total) by mouth 2 (two) times daily.    Dispense:  28 tablet    Refill:  0     Historical information moved to improve visibility of documentation.  Past Medical History:  Diagnosis Date  . Essential hypertension 12/04/2013  . Type 2 diabetes mellitus (HCC) 12/04/2013   Past Surgical History:  Procedure Laterality Date  . ANTERIOR CERVICAL DECOMPRESSION/DISCECTOMY FUSION 4 LEVELS N/A 09/07/2016  Procedure: Anterior Cervical Diskectomy Fusion - Cervical three- Cervical four - Cervical four - Cervical five - Cervical five- Cervical six  - Cervical six - Cervical seven;  Surgeon: Julio Sicks, MD;  Location: [facility] OR;  Service: Neurosurgery;  Laterality: N/A;  . KNEE ARTHROSCOPY Left   . spinal cord surgery     20+ yrs ago   Social History   Tobacco Use  . Smoking status: Former Games developer  . Smokeless tobacco: Never Used  Substance Use Topics  . Alcohol use: No    Alcohol/week: 0.0 standard drinks   family history includes Alcohol abuse in his father and mother; Cancer in his mother and another family member; Diabetes in his father; Hypertension in his father.  Medications:  Current Outpatient Medications  Medication Sig Dispense Refill  . AMBULATORY NON FORMULARY MEDICATION 54ml syringe, 21g 1.5 inch needles and 18 gauge blunt filler needles for testosterone injections every 2 weeks. Disp qs x 3 months. 1 each 0  . AMBULATORY NON FORMULARY MEDICATION Single glucometer with lancets, test strips. Test daily. E11.8 1 each 0  . amLODipine (NORVASC) 10 MG tablet Take 1 tablet (10 mg total) by mouth daily. 90 tablet 2  . atorvastatin (LIPITOR) 40 MG tablet TAKE 1 TABLET BY MOUTH EVERY DAY 90 tablet 1  . benazepril (LOTENSIN) 20 MG tablet Take 1 tablet by mouth daily. 90 tablet 3  . dapagliflozin propanediol (FARXIGA) 10 MG TABS tablet Take 10 mg by mouth daily. 90 tablet 1  . insulin degludec (TRESIBA FLEXTOUCH) 100 UNIT/ML SOPN FlexTouch Pen Inject 0.1 mLs (10 Units total) into the skin daily. 5 pen 12  . Insulin Pen Needle (ADVOCATE INSULIN PEN NEEDLES) 33G X 4 MM MISC 1 each by Does not apply route daily. Use with insulin pen daily 100 each 12  . metFORMIN (GLUCOPHAGE) 1000 MG tablet Take 1 tablet (1,000 mg total) by mouth 2 (two) times daily with a meal. 180 tablet 3  . phentermine (ADIPEX-P) 37.5 MG tablet One tab by mouth qAM 30 tablet 0  . tadalafil (CIALIS) 5 MG tablet Take 1 tablet (5 mg total) by mouth daily. 30 tablet 12  . tamsulosin (FLOMAX) 0.4 MG CAPS capsule Take 1 capsule (0.4 mg total) by mouth daily after breakfast. 90 capsule 3  . TRULICITY 1.5 MG/0.5ML SOPN INJECT 1.5 MG INTO THE SKIN ONCE A WEEK. 6 pen 5  . ciprofloxacin (CIPRO) 500 MG tablet Take 1 tablet (500 mg total) by mouth 2 (two) times daily. 28 tablet 0   No current facility-administered medications for this visit.    No Known Allergies   Discussed warning signs or symptoms. Please see discharge instructions. Patient expresses understanding.

## 2019-03-21 ENCOUNTER — Encounter: Payer: Self-pay | Admitting: Family Medicine

## 2019-03-21 LAB — URINE CULTURE
MICRO NUMBER:: 407228
Result:: NO GROWTH
SPECIMEN QUALITY:: ADEQUATE

## 2019-03-22 ENCOUNTER — Encounter: Payer: Self-pay | Admitting: Family Medicine

## 2019-03-27 MED ORDER — CIPROFLOXACIN HCL 500 MG PO TABS: 500.0000 mg | ORAL_TABLET | Freq: Two times a day (BID) | ORAL | 0 refills | Status: DC

## 2019-03-29 ENCOUNTER — Encounter: Payer: Self-pay | Admitting: Family Medicine

## 2019-03-30 DIAGNOSIS — I10 Essential (primary) hypertension: Secondary | ICD-10-CM | POA: Diagnosis not present

## 2019-03-30 DIAGNOSIS — E118 Type 2 diabetes mellitus with unspecified complications: Secondary | ICD-10-CM | POA: Diagnosis not present

## 2019-03-30 DIAGNOSIS — E1169 Type 2 diabetes mellitus with other specified complication: Secondary | ICD-10-CM | POA: Diagnosis not present

## 2019-03-30 DIAGNOSIS — E1159 Type 2 diabetes mellitus with other circulatory complications: Secondary | ICD-10-CM | POA: Diagnosis not present

## 2019-03-31 LAB — CBC
HCT: 39.8 % (ref 38.5–50.0)
Hemoglobin: 13.7 g/dL (ref 13.2–17.1)
MCH: 29.1 pg (ref 27.0–33.0)
MCHC: 34.4 g/dL (ref 32.0–36.0)
MCV: 84.7 fL (ref 80.0–100.0)
MPV: 9.9 fL (ref 7.5–12.5)
Platelets: 318 10*3/uL (ref 140–400)
RBC: 4.7 10*6/uL (ref 4.20–5.80)
RDW: 13.8 % (ref 11.0–15.0)
WBC: 12.1 10*3/uL — ABNORMAL HIGH (ref 3.8–10.8)

## 2019-03-31 LAB — COMPLETE METABOLIC PANEL WITH GFR
AG Ratio: 1.5 (calc) (ref 1.0–2.5)
ALT: 173 U/L — ABNORMAL HIGH (ref 9–46)
AST: 92 U/L — ABNORMAL HIGH (ref 10–35)
Albumin: 4 g/dL (ref 3.6–5.1)
Alkaline phosphatase (APISO): 252 U/L — ABNORMAL HIGH (ref 35–144)
BUN: 19 mg/dL (ref 7–25)
CO2: 21 mmol/L (ref 20–32)
Calcium: 9 mg/dL (ref 8.6–10.3)
Chloride: 109 mmol/L (ref 98–110)
Creat: 1.14 mg/dL (ref 0.70–1.33)
GFR, Est African American: 84 mL/min/{1.73_m2} (ref >=60)
GFR, Est Non African American: 73 mL/min/{1.73_m2} (ref >=60)
Globulin: 2.6 g/dL (calc) (ref 1.9–3.7)
Glucose, Bld: 108 mg/dL — ABNORMAL HIGH (ref 65–99)
Potassium: 4.5 mmol/L (ref 3.5–5.3)
Sodium: 141 mmol/L (ref 135–146)
Total Bilirubin: 0.4 mg/dL (ref 0.2–1.2)
Total Protein: 6.6 g/dL (ref 6.1–8.1)

## 2019-03-31 LAB — HEMOGLOBIN A1C
Hgb A1c MFr Bld: 7.3 % of total Hgb — ABNORMAL HIGH (ref <5.7)
Mean Plasma Glucose: 163 (calc)
eAG (mmol/L): 9 (calc)

## 2019-03-31 LAB — LIPID PANEL W/REFLEX DIRECT LDL
Cholesterol: 102 mg/dL (ref <200)
HDL: 37 mg/dL — ABNORMAL LOW (ref >=40)
LDL Cholesterol (Calc): 48 mg/dL (calc)
Non-HDL Cholesterol (Calc): 65 mg/dL (calc) (ref <130)
Total CHOL/HDL Ratio: 2.8 (calc) (ref <5.0)
Triglycerides: 88 mg/dL (ref <150)

## 2019-04-04 ENCOUNTER — Encounter: Payer: Self-pay | Admitting: Family Medicine

## 2019-04-05 DIAGNOSIS — N410 Acute prostatitis: Secondary | ICD-10-CM | POA: Diagnosis not present

## 2019-04-05 DIAGNOSIS — N5201 Erectile dysfunction due to arterial insufficiency: Secondary | ICD-10-CM | POA: Diagnosis not present

## 2019-04-05 DIAGNOSIS — R3915 Urgency of urination: Secondary | ICD-10-CM | POA: Diagnosis not present

## 2019-04-05 DIAGNOSIS — R8271 Bacteriuria: Secondary | ICD-10-CM | POA: Diagnosis not present

## 2019-04-05 DIAGNOSIS — N401 Enlarged prostate with lower urinary tract symptoms: Secondary | ICD-10-CM | POA: Diagnosis not present

## 2019-04-06 ENCOUNTER — Other Ambulatory Visit: Payer: Self-pay

## 2019-04-06 ENCOUNTER — Ambulatory Visit (INDEPENDENT_AMBULATORY_CARE_PROVIDER_SITE_OTHER): Payer: BLUE CROSS/BLUE SHIELD | Admitting: Family Medicine

## 2019-04-06 ENCOUNTER — Encounter: Payer: Self-pay | Admitting: Family Medicine

## 2019-04-06 ENCOUNTER — Ambulatory Visit (INDEPENDENT_AMBULATORY_CARE_PROVIDER_SITE_OTHER): Payer: BLUE CROSS/BLUE SHIELD

## 2019-04-06 VITALS — BP 133/74 | HR 101 | Temp 98.0°F | Wt 222.0 lb

## 2019-04-06 DIAGNOSIS — R202 Paresthesia of skin: Secondary | ICD-10-CM

## 2019-04-06 DIAGNOSIS — M542 Cervicalgia: Secondary | ICD-10-CM

## 2019-04-06 DIAGNOSIS — M5031 Other cervical disc degeneration,  high cervical region: Secondary | ICD-10-CM | POA: Diagnosis not present

## 2019-04-06 MED ORDER — PHENTERMINE HCL 37.5 MG PO TABS: ORAL_TABLET | ORAL | 0 refills | Status: DC

## 2019-04-06 NOTE — Progress Notes (Signed)
Rohit Roxan HockeyRobinson is a 53 y.o. male who presents to [facility] today for worsening neck pain and arm numbness and tingling.  Fielding has a history of significant cervical spine disease.  In October 2017 he had extensive cervical spine decompressive surgery ACDF - C3-C4 - C4-C5 - C5-C6 - C6-C7 with Julio SicksPool, Henry, MD. indication for surgery was significant left hand weakness.  In the interim he notes that he has done pretty well.  He denies any neck pain or new or worsening weakness.  He did regain some strength to his left hand but notes he continued to have weakness especially with hand extension.  He notes however about a month or 2 ago he started having worsening neck pain.  He notes that when his head is in normal alignment he is comfortable but when he lays on his side or drives or has his head rotated he experiences pain into the left and right lateral neck.  More bothersome to him however is with the symptoms he typically has numbness or burning tingly sensation down his right arm to the fourth and fifth digits.  He additionally has a similar sensation to the left lateral neck and upper arm.  He denies any new sensation to the left hand.  He denies any new weakness distally.  No fevers or chills.  No new injury.  Additionally he notes that he has been continuing to take phentermine for weight loss.  He tolerates it quite well and continues to lose weight.  He is due for a nurse visit next week for weight check and blood pressure check but he like to do that now if possible since he is already in the clinic.  ROS:  As above  Exam:  BP 133/74   Pulse (!) 101   Temp 98 F (36.7 C) (Oral)   Wt 222 lb (100.7 kg)   BMI 36.94 kg/m   Heart rate per my check 88 bpm Wt Readings from Last 5 Encounters:  04/06/19 222 lb (100.7 kg)  03/20/19 227 lb (103 kg)  03/15/19 226 lb (102.5 kg)  02/15/19 229 lb (103.9 kg)  01/16/19 239 lb (108.4 kg)    General: Well Developed, well nourished, and in no acute distress.  Neuro/Psych: Alert and oriented x3, extra-ocular muscles intact, able to move all 4 extremities, sensation grossly intact. Skin: Warm and dry, no rashes noted.  Respiratory: Not using accessory muscles, speaking in full sentences, trachea midline.  Cardiovascular: Pulses palpable, no extremity edema. Abdomen: Does not appear distended. MSK:  C-spine: Nontender to spinal midline.  Decreased cervical motion. Strength: Shoulder abduction 5/5 right, 5/5 left Tricep extension: 5/5 right, 4/5 left Elbow flexion: 5/5 right, 5/5 left Wrist and hand extension: 5/5 right, 2/5 left Wrist and hand flexion and grip 5/5 right, 4+/5 left Finger abduction: 5/5 right, 2/5 left Finger adduction 5/5 right, 3/5 left  Reflexes intact and equal bilateral triceps and biceps.  Diminished left forearm.  Intact right forearm  Sensation is intact throughout bilateral upper extremities   Right elbow normal-appearing normal motion nontender mildly positive right Tinel's at cubital tunnel.  Negative reproduction of paresthesias with persistent elbow flexion.  Lab and Radiology Results X-ray images C-spine personally independently reviewed. Well appearing anterior fusion at C3-C7 with no significant change in alignment or change in surgical hardware from previous x-rays obtained in 2017. Await formal radiology review    Assessment and Plan: 53 y.o. male with new neck pain and new paresthesias  in the right arm and left upper arm. Fortunately patient does not have new weakness especially involving his right arm however he does have new numbness and tingling down the right arm in a C8 dermatome.  His fusion ends at the C6-7 level and he certainly could have worsening disease at C7-T1 which could compress the right C8 nerve root.  Additionally he is having some symptoms into the left neck and upper arm which could possibly be C2 or C3 related which  could be involved as well above the level of the fusion.  We discussed options.  Ideally would like to proceed with MRI however I will discuss with radiology how much information renal learn from an MRI of his cervical spine.  The existing hardware will likely interfere with the radiology picture.  If MRI is simply not going to be helpful or not available may proceed with CT myelogram.  Additionally obesity: Continue to lose weight with phentermine.  Doing well.  Blood pressure controlled.  Heart rate controlled.  Refill phentermine recheck in 1 month.  PDMP not reviewed this encounter. Orders Placed This Encounter  Procedures  . DG Cervical Spine 2 or 3 views    Standing Status:   Future    Number of Occurrences:   1    Standing Expiration Date:   06/05/2020    Order Specific Question:   Reason for Exam (SYMPTOM  OR DIAGNOSIS REQUIRED)    Answer:   eval surgical hardware. New radicular symptoms    Order Specific Question:   Preferred imaging location?    Answer:   [facility]    Order Specific Question:   Radiology Contrast Protocol - do NOT remove file path    Answer:   [email or web address]   Meds ordered this encounter  Medications  . phentermine (ADIPEX-P) 37.5 MG tablet    Sig: One tab by mouth qAM    Dispense:  30 tablet    Refill:  0    Historical information moved to improve visibility of documentation.  Past Medical History:  Diagnosis Date  . Essential hypertension 12/04/2013  . Type 2 diabetes mellitus (HCC) 12/04/2013   Past Surgical History:  Procedure Laterality Date  . ANTERIOR CERVICAL DECOMPRESSION/DISCECTOMY FUSION 4 LEVELS N/A 09/07/2016   Procedure: Anterior Cervical Diskectomy Fusion - Cervical three- Cervical four - Cervical four - Cervical five - Cervical five- Cervical six  - Cervical six - Cervical seven;  Surgeon: Julio Sicks, MD;  Location: [facility] OR;  Service: Neurosurgery;  Laterality: N/A;  . KNEE ARTHROSCOPY  Left   . spinal cord surgery     20+ yrs ago   Social History   Tobacco Use  . Smoking status: Former Games developer  . Smokeless tobacco: Never Used  Substance Use Topics  . Alcohol use: No    Alcohol/week: 0.0 standard drinks   family history includes Alcohol abuse in his father and mother; Cancer in his mother and another family member; Diabetes in his father; Hypertension in his father.  Medications: Current Outpatient Medications  Medication Sig Dispense Refill  . AMBULATORY NON FORMULARY MEDICATION 64ml syringe, 21g 1.5 inch needles and 18 gauge blunt filler needles for testosterone injections every 2 weeks. Disp qs x 3 months. 1 each 0  . AMBULATORY NON FORMULARY MEDICATION Single glucometer with lancets, test strips. Test daily. E11.8 1 each 0  . amLODipine (NORVASC) 10 MG tablet Take 1 tablet (10 mg total) by mouth daily. 90 tablet 2  . atorvastatin (LIPITOR) 40 MG  tablet TAKE 1 TABLET BY MOUTH EVERY DAY 90 tablet 1  . benazepril (LOTENSIN) 20 MG tablet Take 1 tablet by mouth daily. 90 tablet 3  . dapagliflozin propanediol (FARXIGA) 10 MG TABS tablet Take 10 mg by mouth daily. 90 tablet 1  . Ertugliflozin L-PyroglutamicAc (STEGLATRO) 15 MG TABS Steglatro 15 mg tablet    . insulin degludec (TRESIBA FLEXTOUCH) 100 UNIT/ML SOPN FlexTouch Pen Inject 0.1 mLs (10 Units total) into the skin daily. 5 pen 12  . Insulin Pen Needle (ADVOCATE INSULIN PEN NEEDLES) 33G X 4 MM MISC 1 each by Does not apply route daily. Use with insulin pen daily 100 each 12  . metFORMIN (GLUCOPHAGE) 1000 MG tablet Take 1 tablet (1,000 mg total) by mouth 2 (two) times daily with a meal. 180 tablet 3  . phentermine (ADIPEX-P) 37.5 MG tablet One tab by mouth qAM 30 tablet 0  . tadalafil (CIALIS) 5 MG tablet Take 1 tablet (5 mg total) by mouth daily. 30 tablet 12  . tamsulosin (FLOMAX) 0.4 MG CAPS capsule Take 1 capsule (0.4 mg total) by mouth daily after breakfast. 90 capsule 3  . TRULICITY 1.5 MG/0.5ML SOPN INJECT 1.5  MG INTO THE SKIN ONCE A WEEK. 6 pen 5   No current facility-administered medications for this visit.    No Known Allergies    Discussed warning signs or symptoms. Please see discharge instructions. Patient expresses understanding.

## 2019-04-06 NOTE — Patient Instructions (Addendum)
Thank you for coming in today. I will discuss with the radiologist MRI of your neck once the read is back.   The tingling into the right hand could be cubital tunnel syndrome which is less likely. You can use a towel wrapped around the elbow at night to prevent full arm flexion  We will decide what the next step is based on MRI availability

## 2019-04-08 ENCOUNTER — Other Ambulatory Visit: Payer: Self-pay | Admitting: Family Medicine

## 2019-04-11 ENCOUNTER — Encounter: Payer: Self-pay | Admitting: Family Medicine

## 2019-04-11 DIAGNOSIS — M5412 Radiculopathy, cervical region: Secondary | ICD-10-CM

## 2019-04-12 ENCOUNTER — Other Ambulatory Visit: Payer: Self-pay | Admitting: Family Medicine

## 2019-04-12 MED ORDER — DAPAGLIFLOZIN PROPANEDIOL 10 MG PO TABS: 10.0000 mg | ORAL_TABLET | Freq: Every day | ORAL | 1 refills | Status: DC

## 2019-04-16 ENCOUNTER — Ambulatory Visit (INDEPENDENT_AMBULATORY_CARE_PROVIDER_SITE_OTHER): Payer: BLUE CROSS/BLUE SHIELD

## 2019-04-16 ENCOUNTER — Other Ambulatory Visit: Payer: Self-pay

## 2019-04-16 DIAGNOSIS — M5412 Radiculopathy, cervical region: Secondary | ICD-10-CM | POA: Diagnosis not present

## 2019-04-16 DIAGNOSIS — M4802 Spinal stenosis, cervical region: Secondary | ICD-10-CM | POA: Diagnosis not present

## 2019-04-18 ENCOUNTER — Encounter: Payer: Self-pay | Admitting: Family Medicine

## 2019-04-19 DIAGNOSIS — N5201 Erectile dysfunction due to arterial insufficiency: Secondary | ICD-10-CM | POA: Diagnosis not present

## 2019-04-19 DIAGNOSIS — N410 Acute prostatitis: Secondary | ICD-10-CM | POA: Diagnosis not present

## 2019-04-19 DIAGNOSIS — N401 Enlarged prostate with lower urinary tract symptoms: Secondary | ICD-10-CM | POA: Diagnosis not present

## 2019-04-19 DIAGNOSIS — R3915 Urgency of urination: Secondary | ICD-10-CM | POA: Diagnosis not present

## 2019-04-22 ENCOUNTER — Other Ambulatory Visit: Payer: Self-pay | Admitting: Family Medicine

## 2019-04-22 DIAGNOSIS — I10 Essential (primary) hypertension: Secondary | ICD-10-CM

## 2019-04-25 ENCOUNTER — Encounter: Payer: Self-pay | Admitting: Family Medicine

## 2019-04-25 ENCOUNTER — Telehealth (INDEPENDENT_AMBULATORY_CARE_PROVIDER_SITE_OTHER): Payer: BLUE CROSS/BLUE SHIELD | Admitting: Family Medicine

## 2019-04-25 VITALS — BP 125/80 | Temp 98.3°F | Wt 225.0 lb

## 2019-04-25 DIAGNOSIS — M5412 Radiculopathy, cervical region: Secondary | ICD-10-CM | POA: Diagnosis not present

## 2019-04-25 NOTE — Progress Notes (Signed)
Blood sugar readings this AM 79 before breakfast  145 after breakfast

## 2019-04-25 NOTE — Progress Notes (Signed)
Virtual Visit  via Video Note  I connected with      Jordan Taylor by a video enabled telemedicine application and verified that I am speaking with the correct person using two identifiers.   I discussed the limitations of evaluation and management by telemedicine and the availability of in person appointments. The patient expressed understanding and agreed to proceed.  History of Present Illness: Jordan Taylor is a 53 y.o. male who would like to discuss cervical radiculopathy.  Patient was seen previously and found to have pain radiating down his right arm to his right fourth and fifth digit.  He has a history of cervical fusion and had MRI to reassess assess his cervical changes.  He notes since his MRI his symptoms do continue or perhaps slightly better.  He denies any new weakness down his right arm.   Observations/Objective: BP 125/80   Temp 98.3 F (36.8 C) (Oral)   Wt 225 lb (102.1 kg)   BMI 37.44 kg/m  Wt Readings from Last 5 Encounters:  04/25/19 225 lb (102.1 kg)  04/06/19 222 lb (100.7 kg)  03/20/19 227 lb (103 kg)  03/15/19 226 lb (102.5 kg)  02/15/19 229 lb (103.9 kg)   Exam: Appearance Normal Speech.  Nontoxic no acute distress.  Lab and Radiology Results Dg Cervical Spine 2 Or 3 Views  Result Date: 04/06/2019 CLINICAL DATA:  Radiculopathy.  Cervical hardware. EXAM: CERVICAL SPINE - 2-3 VIEW COMPARISON:  C-spine radiograph dated 10/08/2016 FINDINGS: The patient is status post ACDF from C3 through C7. The hardware appears grossly intact. Interbody spacers are noted. There are increasing degenerative changes at the C2-C3 level. There is no significant prevertebral soft tissue swelling. IMPRESSION: 1. No acute displaced fracture or dislocation. 2. Intact ACDF hardware as above. 3. Increasing degenerative changes at the C2-C3 level. Electronically Signed   By: Katherine Mantle M.D.   On: 04/06/2019 14:01   Mr Cervical Spine Wo Contrast  Result Date:  04/17/2019 CLINICAL DATA:  Pt thinks that he originally injured his neck by jumping out of airplanes in the [employer]. C-Spine sx X 2 (about 30 years and in Sept 2017. Left hand has limited use and he has tingling in the right arm. EXAM: MRI CERVICAL SPINE WITHOUT CONTRAST TECHNIQUE: Multiplanar, multisequence MR imaging of the cervical spine was performed. No intravenous contrast was administered. COMPARISON:  07/07/2016 FINDINGS: Alignment: Physiologic. Vertebrae: No fracture, evidence of discitis, or bone lesion. Cord: Focal T2 hyperintense signal within the cervical spinal cord with volume loss at C4-5 and to lesser extent at C5-6 most consistent with myelomalacia. Posterior Fossa, vertebral arteries, paraspinal tissues: Posterior fossa demonstrates no focal abnormality. Vertebral artery flow voids are maintained. Paraspinal soft tissues are unremarkable. Disc levels: Discs: Anterior cervical fusion from C3 through C7. Mild degenerative disc disease with disc height loss at C7-T1. C2-3: Minimal broad-based disc bulge. Mild bilateral foraminal stenosis. Mild left facet arthropathy. No central canal stenosis. C3-4: Interbody fusion. Moderate bilateral foraminal stenosis. No central canal stenosis. C4-5: Interbody fusion. Mild bilateral foraminal stenosis. No central canal stenosis. C5-6: Interbody fusion with focal hypertrophic changes along the left paracentral aspect. Mild right and moderate left foraminal stenosis. No central canal stenosis. C6-7: Interbody fusion. Mild left foraminal stenosis. No right foraminal stenosis. No central canal stenosis. C7-T1: Broad-based disc bulge. Bilateral uncovertebral degenerative changes. Moderate-severe bilateral foraminal stenosis. No central canal stenosis. IMPRESSION: 1. Diffuse lumbar spine spondylosis as described above. 2. Anterior cervical fusion from C3 through C7 with foraminal  narrowing as detailed above. Degree of foraminal narrowing is similar to the prior  examination of 07/07/2016. 3. Focal T2 hyperintense signal within the cervical spinal cord with volume loss at C4-5 and to lesser extent at C5-6 most consistent with myelomalacia. Appearance is unchanged compared with 07/07/2016. Electronically Signed   By: Elige KoHetal  Patel   On: 04/17/2019 08:40   I personally (independently) visualized and performed the interpretation of the images attached in this note.    Assessment and Plan: 53 y.o. male with cervical radiculopathy likely at C8 nerve root.  Based on distribution of symptoms and MRI findings.  Plan to proceed with epidural steroid injection and recheck in near future if not improving.  PDMP not reviewed this encounter. Orders Placed This Encounter  Procedures  . DG Epidurography    Order Specific Question:   Reason for Exam (SYMPTOM  OR DIAGNOSIS REQUIRED)    Answer:   C7-T1    Order Specific Question:   Preferred imaging location?    Answer:   [id number]   No orders of the defined types were placed in this encounter.   Follow Up Instructions:    I discussed the assessment and treatment plan with the patient. The patient was provided an opportunity to ask questions and all were answered. The patient agreed with the plan and demonstrated an understanding of the instructions.   The patient was advised to call back or seek an in-person evaluation if the symptoms worsen or if the condition fails to improve as anticipated.  Time: 15 minutes of intraservice time, with >22 minutes of total time during today's visit.      Historical information moved to improve visibility of documentation.  Past Medical History:  Diagnosis Date  . Essential hypertension 12/04/2013  . Type 2 diabetes mellitus (HCC) 12/04/2013   Past Surgical History:  Procedure Laterality Date  . ANTERIOR CERVICAL DECOMPRESSION/DISCECTOMY FUSION 4 LEVELS N/A 09/07/2016   Procedure: Anterior Cervical Diskectomy Fusion - Cervical three- Cervical four - Cervical four  - Cervical five - Cervical five- Cervical six  - Cervical six - Cervical seven;  Surgeon: Julio SicksHenry Pool, MD;  [facility] OR;  Service: Neurosurgery;  Laterality: N/A;  . KNEE ARTHROSCOPY Left   . spinal cord surgery     20+ yrs ago   Social History   Tobacco Use  . Smoking status: Former Games developermoker  . Smokeless tobacco: Never Used  Substance Use Topics  . Alcohol use: No    Alcohol/week: 0.0 standard drinks   family history includes Alcohol abuse in his father and mother; Cancer in his mother and another family member; Diabetes in his father; Hypertension in his father.  Medications: Current Outpatient Medications  Medication Sig Dispense Refill  . AMBULATORY NON FORMULARY MEDICATION 3ml syringe, 21g 1.5 inch needles and 18 gauge blunt filler needles for testosterone injections every 2 weeks. Disp qs x 3 months. 1 each 0  . AMBULATORY NON FORMULARY MEDICATION Single glucometer with lancets, test strips. Test daily. E11.8 1 each 0  . amLODipine (NORVASC) 10 MG tablet TAKE 1 TABLET BY MOUTH EVERY DAY 90 tablet 0  . atorvastatin (LIPITOR) 40 MG tablet TAKE 1 TABLET BY MOUTH EVERY DAY 90 tablet 1  . benazepril (LOTENSIN) 20 MG tablet Take 1 tablet by mouth daily. 90 tablet 3  . dapagliflozin propanediol (FARXIGA) 10 MG TABS tablet Take 10 mg by mouth daily. 90 tablet 1  . Ertugliflozin L-PyroglutamicAc (STEGLATRO) 15 MG TABS Steglatro 15 mg tablet    .  insulin degludec (TRESIBA FLEXTOUCH) 100 UNIT/ML SOPN FlexTouch Pen Inject 0.1 mLs (10 Units total) into the skin daily. 5 pen 12  . Insulin Pen Needle (ADVOCATE INSULIN PEN NEEDLES) 33G X 4 MM MISC 1 each by Does not apply route daily. Use with insulin pen daily 100 each 12  . metFORMIN (GLUCOPHAGE) 1000 MG tablet Take 1 tablet (1,000 mg total) by mouth 2 (two) times daily with a meal. 180 tablet 3  . phentermine (ADIPEX-P) 37.5 MG tablet One tab by mouth qAM 30 tablet 0  . tadalafil (CIALIS) 5 MG tablet Take 1 tablet (5 mg total) by mouth daily.  30 tablet 12  . tamsulosin (FLOMAX) 0.4 MG CAPS capsule Take 1 capsule (0.4 mg total) by mouth daily after breakfast. 90 capsule 3  . TRULICITY 1.5 MG/0.5ML SOPN INJECT 1.5 MG INTO THE SKIN ONCE A WEEK. 6 pen 5   No current facility-administered medications for this visit.    Allergies  Allergen Reactions  . Penicillins Swelling

## 2019-04-25 NOTE — Patient Instructions (Signed)
Thank you for coming in today. You should hear about the injection soon. Let me know if you do not hear anything.  Keep me updated on how you are feeling.

## 2019-05-01 ENCOUNTER — Inpatient Hospital Stay: Admission: RE | Admit: 2019-05-01 | Payer: BLUE CROSS/BLUE SHIELD | Source: Ambulatory Visit

## 2019-05-04 ENCOUNTER — Ambulatory Visit
Admission: RE | Admit: 2019-05-04 | Discharge: 2019-05-04 | Disposition: A | Discharge status: Home / Self Care | Payer: BLUE CROSS/BLUE SHIELD | Source: Ambulatory Visit | Attending: Family Medicine | Admitting: Family Medicine

## 2019-05-04 ENCOUNTER — Other Ambulatory Visit: Payer: Self-pay

## 2019-05-04 DIAGNOSIS — M47812 Spondylosis without myelopathy or radiculopathy, cervical region: Secondary | ICD-10-CM | POA: Diagnosis not present

## 2019-05-04 MED ORDER — IOPAMIDOL (ISOVUE-M 300) INJECTION 61%
1.0000 mL | Freq: Once | INTRAMUSCULAR | Status: AC
  Administered 2019-05-04: 15:00:00 1 mL via EPIDURAL

## 2019-05-04 MED ORDER — TRIAMCINOLONE ACETONIDE 40 MG/ML IJ SUSP (RADIOLOGY)
60.0000 mg | Freq: Once | INTRAMUSCULAR | Status: AC
  Administered 2019-05-04: 60 mg via EPIDURAL

## 2019-05-04 NOTE — Discharge Instructions (Signed)

## 2019-05-08 ENCOUNTER — Telehealth: Payer: Self-pay | Admitting: Family Medicine

## 2019-05-08 ENCOUNTER — Encounter: Payer: Self-pay | Admitting: Family Medicine

## 2019-05-08 DIAGNOSIS — R7401 Elevation of levels of liver transaminase levels: Secondary | ICD-10-CM

## 2019-05-08 NOTE — Telephone Encounter (Signed)
-----   Message from Gregor Hams, MD sent at 03/31/2019  9:53 AM EDT ----- Regarding: Recheck liver labs in 1 month Worsening transaminitis on repeat labs today.  Will want to recheck liver labs in about 1 month.

## 2019-05-08 NOTE — Telephone Encounter (Signed)
Repeat liver labs ordered to follow-up elevated liver enzymes about a month ago.  Labs do not need to be fasting.  Please get labs soon.

## 2019-05-08 NOTE — Telephone Encounter (Signed)
Patient was notified and voices understanding. No further questions at this time.  

## 2019-05-11 ENCOUNTER — Ambulatory Visit (INDEPENDENT_AMBULATORY_CARE_PROVIDER_SITE_OTHER): Payer: BLUE CROSS/BLUE SHIELD | Admitting: Family Medicine

## 2019-05-11 VITALS — BP 133/76 | HR 97 | Ht 65.0 in | Wt 221.0 lb

## 2019-05-11 DIAGNOSIS — R635 Abnormal weight gain: Secondary | ICD-10-CM

## 2019-05-11 DIAGNOSIS — R740 Nonspecific elevation of levels of transaminase and lactic acid dehydrogenase [LDH]: Secondary | ICD-10-CM | POA: Diagnosis not present

## 2019-05-11 MED ORDER — PHENTERMINE HCL 37.5 MG PO TABS: ORAL_TABLET | ORAL | 0 refills | Status: DC

## 2019-05-11 NOTE — Progress Notes (Signed)
Established Patient Office Visit  Subjective:  Patient ID: Jordan Taylor, male    DOB: 06-15-1966  Age: 53 y.o. MRN: [id number]  CC:  Chief Complaint  Patient presents with  . Weight Check    HPI Ayo Britten is here for blood pressure and weight check. Diet and exercise is going well. Denies trouble sleeping or palpitations.   Past Medical History:  Diagnosis Date  . Essential hypertension 12/04/2013  . Type 2 diabetes mellitus ([place]) 12/04/2013    Past Surgical History:  Procedure Laterality Date  . ANTERIOR CERVICAL DECOMPRESSION/DISCECTOMY FUSION 4 LEVELS N/A 09/07/2016   Procedure: Anterior Cervical Diskectomy Fusion - Cervical three- Cervical four - Cervical four - Cervical five - Cervical five- Cervical six  - Cervical six - Cervical seven;  Surgeon: Earnie Larsson, MD;  Location: [facility];  Service: Neurosurgery;  Laterality: N/A;  . KNEE ARTHROSCOPY Left   . spinal cord surgery     20+ yrs ago    Family History  Problem Relation Age of Onset  . Alcohol abuse Mother   . Cancer Mother   . Alcohol abuse Father   . Diabetes Father   . Hypertension Father   . Cancer Other     Social History   Socioeconomic History  . Marital status: Married    Spouse name: [name]  . Number of children: 5  . Years of education: College  . Highest education level: Not on file  Occupational History  . Occupation: Engineer, water: [employer]  . Financial resource strain: Not on file  . Food insecurity    Worry: Not on file    Inability: Not on file  . Transportation needs    Medical: Not on file    Non-medical: Not on file  Tobacco Use  . Smoking status: Former Research scientist (life sciences)  . Smokeless tobacco: Never Used  Substance and Sexual Activity  . Alcohol use: No    Alcohol/week: 0.0 standard drinks  . Drug use: No  . Sexual activity: Yes    Partners: Female  Lifestyle  . Physical activity    Days per week: Not on file    Minutes per  session: Not on file  . Stress: Not on file  Relationships  . Social Herbalist on phone: Not on file    Gets together: Not on file    Attends religious service: Not on file    Active member of club or organization: Not on file    Attends meetings of clubs or organizations: Not on file    Relationship status: Not on file  . Intimate partner violence    Fear of current or ex partner: Not on file    Emotionally abused: Not on file    Physically abused: Not on file    Forced sexual activity: Not on file  Other Topics Concern  . Not on file  Social History Narrative   Lives at home w/ his wife   Right-handed   Drinks about 10 cans of soda per day       Outpatient Medications Prior to Visit  Medication Sig Dispense Refill  . AMBULATORY NON FORMULARY MEDICATION 62ml syringe, 21g 1.5 inch needles and 18 gauge blunt filler needles for testosterone injections every 2 weeks. Disp qs x 3 months. 1 each 0  . AMBULATORY NON FORMULARY MEDICATION Single glucometer with lancets, test strips. Test daily. E11.8 1 each 0  . amLODipine (NORVASC)  10 MG tablet TAKE 1 TABLET BY MOUTH EVERY DAY 90 tablet 0  . atorvastatin (LIPITOR) 40 MG tablet TAKE 1 TABLET BY MOUTH EVERY DAY 90 tablet 1  . benazepril (LOTENSIN) 20 MG tablet Take 1 tablet by mouth daily. 90 tablet 3  . dapagliflozin propanediol (FARXIGA) 10 MG TABS tablet Take 10 mg by mouth daily. 90 tablet 1  . Ertugliflozin L-PyroglutamicAc (STEGLATRO) 15 MG TABS Steglatro 15 mg tablet    . insulin degludec (TRESIBA FLEXTOUCH) 100 UNIT/ML SOPN FlexTouch Pen Inject 0.1 mLs (10 Units total) into the skin daily. 5 pen 12  . Insulin Pen Needle (ADVOCATE INSULIN PEN NEEDLES) 33G X 4 MM MISC 1 each by Does not apply route daily. Use with insulin pen daily 100 each 12  . metFORMIN (GLUCOPHAGE) 1000 MG tablet Take 1 tablet (1,000 mg total) by mouth 2 (two) times daily with a meal. 180 tablet 3  . phentermine (ADIPEX-P) 37.5 MG tablet One tab by  mouth qAM 30 tablet 0  . tadalafil (CIALIS) 5 MG tablet Take 1 tablet (5 mg total) by mouth daily. 30 tablet 12  . tamsulosin (FLOMAX) 0.4 MG CAPS capsule Take 1 capsule (0.4 mg total) by mouth daily after breakfast. 90 capsule 3  . TRULICITY 1.5 MG/0.5ML SOPN INJECT 1.5 MG INTO THE SKIN ONCE A WEEK. 6 pen 5   No facility-administered medications prior to visit.     Allergies  Allergen Reactions  . Penicillins Swelling    ROS Review of Systems    Objective:    Physical Exam  BP 133/76   Pulse 97   Ht 5\' 5"  (1.651 m)   Wt 221 lb (100.2 kg)   SpO2 99%   BMI 36.78 kg/m  Wt Readings from Last 3 Encounters:  05/11/19 221 lb (100.2 kg)  04/25/19 225 lb (102.1 kg)  04/06/19 222 lb (100.7 kg)     There are no preventive care reminders to display for this patient.  There are no preventive care reminders to display for this patient.  No results found for: TSH Lab Results  Component Value Date   WBC 12.1 (H) 03/30/2019   HGB 13.7 03/30/2019   HCT 39.8 03/30/2019   MCV 84.7 03/30/2019   PLT 318 03/30/2019   Lab Results  Component Value Date   NA 141 03/30/2019   K 4.5 03/30/2019   CO2 21 03/30/2019   GLUCOSE 108 (H) 03/30/2019   BUN 19 03/30/2019   CREATININE 1.14 03/30/2019   BILITOT 0.4 03/30/2019   ALKPHOS 109 01/28/2015   AST 92 (H) 03/30/2019   ALT 173 (H) 03/30/2019   PROT 6.6 03/30/2019   ALBUMIN 3.8 04/07/2017   CALCIUM 9.0 03/30/2019   ANIONGAP 9 09/02/2016   Lab Results  Component Value Date   CHOL 102 03/30/2019   Lab Results  Component Value Date   HDL 37 (L) 03/30/2019   Lab Results  Component Value Date   LDLCALC 48 03/30/2019   Lab Results  Component Value Date   TRIG 88 03/30/2019   Lab Results  Component Value Date   CHOLHDL 2.8 03/30/2019   Lab Results  Component Value Date   HGBA1C 7.3 (H) 03/30/2019      Assessment & Plan:  Abnormal weight gain - Patient has lost weight. A refill on phentermine sent to pharmacy.  Patient advised to schedule a follow up in 4 weeks.    Problem List Items Addressed This Visit    None  No orders of the defined types were placed in this encounter.   Follow-up: Return in about 4 weeks (around 06/08/2019) for blood pressure and weight check. Earna Coder.    Arshia Spellman, Janalyn HarderAngela Hale, CMA

## 2019-05-12 LAB — COMPLETE METABOLIC PANEL WITH GFR
AG Ratio: 1.7 (calc) (ref 1.0–2.5)
ALT: 41 U/L (ref 9–46)
AST: 21 U/L (ref 10–35)
Albumin: 4 g/dL (ref 3.6–5.1)
Alkaline phosphatase (APISO): 126 U/L (ref 35–144)
BUN: 22 mg/dL (ref 7–25)
CO2: 23 mmol/L (ref 20–32)
Calcium: 8.9 mg/dL (ref 8.6–10.3)
Chloride: 107 mmol/L (ref 98–110)
Creat: 0.97 mg/dL (ref 0.70–1.33)
GFR, Est African American: 101 mL/min/{1.73_m2} (ref >=60)
GFR, Est Non African American: 88 mL/min/{1.73_m2} (ref >=60)
Globulin: 2.4 g/dL (calc) (ref 1.9–3.7)
Glucose, Bld: 99 mg/dL (ref 65–99)
Potassium: 4.7 mmol/L (ref 3.5–5.3)
Sodium: 139 mmol/L (ref 135–146)
Total Bilirubin: 0.6 mg/dL (ref 0.2–1.2)
Total Protein: 6.4 g/dL (ref 6.1–8.1)

## 2019-05-12 LAB — AFP TUMOR MARKER: AFP-Tumor Marker: 1.1 ng/mL (ref <6.1)

## 2019-05-18 ENCOUNTER — Ambulatory Visit: Payer: BLUE CROSS/BLUE SHIELD

## 2019-06-21 ENCOUNTER — Ambulatory Visit: Payer: BLUE CROSS/BLUE SHIELD

## 2019-07-03 ENCOUNTER — Other Ambulatory Visit: Payer: Self-pay | Admitting: Family Medicine

## 2019-07-10 DIAGNOSIS — R3915 Urgency of urination: Secondary | ICD-10-CM | POA: Diagnosis not present

## 2019-07-10 DIAGNOSIS — N401 Enlarged prostate with lower urinary tract symptoms: Secondary | ICD-10-CM | POA: Diagnosis not present

## 2019-07-15 ENCOUNTER — Other Ambulatory Visit: Payer: Self-pay | Admitting: Family Medicine

## 2019-07-20 ENCOUNTER — Telehealth (INDEPENDENT_AMBULATORY_CARE_PROVIDER_SITE_OTHER): Payer: BC Managed Care – PPO | Admitting: Family Medicine

## 2019-07-20 VITALS — BP 133/74 | Wt 214.0 lb

## 2019-07-20 DIAGNOSIS — M791 Myalgia, unspecified site: Secondary | ICD-10-CM

## 2019-07-20 DIAGNOSIS — I10 Essential (primary) hypertension: Secondary | ICD-10-CM

## 2019-07-20 DIAGNOSIS — I152 Hypertension secondary to endocrine disorders: Secondary | ICD-10-CM

## 2019-07-20 DIAGNOSIS — R5383 Other fatigue: Secondary | ICD-10-CM

## 2019-07-20 DIAGNOSIS — E1159 Type 2 diabetes mellitus with other circulatory complications: Secondary | ICD-10-CM

## 2019-07-20 DIAGNOSIS — E1169 Type 2 diabetes mellitus with other specified complication: Secondary | ICD-10-CM

## 2019-07-20 DIAGNOSIS — E118 Type 2 diabetes mellitus with unspecified complications: Secondary | ICD-10-CM

## 2019-07-20 DIAGNOSIS — Z23 Encounter for immunization: Secondary | ICD-10-CM

## 2019-07-20 DIAGNOSIS — E291 Testicular hypofunction: Secondary | ICD-10-CM

## 2019-07-20 DIAGNOSIS — R338 Other retention of urine: Secondary | ICD-10-CM

## 2019-07-20 DIAGNOSIS — N401 Enlarged prostate with lower urinary tract symptoms: Secondary | ICD-10-CM

## 2019-07-20 DIAGNOSIS — E785 Hyperlipidemia, unspecified: Secondary | ICD-10-CM

## 2019-07-20 MED ORDER — METFORMIN HCL 1000 MG PO TABS: 1000.0000 mg | ORAL_TABLET | Freq: Two times a day (BID) | ORAL | 3 refills | Status: DC

## 2019-07-20 MED ORDER — PHENTERMINE HCL 37.5 MG PO TABS: ORAL_TABLET | ORAL | 0 refills | Status: DC

## 2019-07-20 NOTE — Progress Notes (Signed)
Virtual Visit  via Video Note  I connected with      [name] by a video enabled telemedicine application and verified that I am speaking with the correct person using two identifiers.   I discussed the limitations of evaluation and management by telemedicine and the availability of in person appointments. The patient expressed understanding and agreed to proceed.  History of Present Illness: Jordan Taylor is a 53 y.o. male who would like to discuss fatigue.   Patient notes that over the last few months he feels tired and muscle soreness after exercising.  He has been exercising and lifting weights.  He notes he does not have any problems during the exercise.  For example he can work out pretty intensely for 60 to 90 minutes at a time and do great.  However after exercise she notes muscle soreness and fatigue.  He denies feeling tired or sleepy.  He denies chest pain or significant shortness of breath.  He feels well overall.  He continues to lose weight taking phentermine.  He is also being pretty careful about his diet and exercise as noted above.  He does have a prescription for atorvastatin.  He takes this regularly for hyperlipidemia.  He is not sure if the atorvastatin is related but notes that sometimes he is had some muscle soreness that he thought might be related to this medication.  He is not tried stopping it yet.  He notes also that his diabetes has been pretty well controlled.  He takes medications listed below.  He notes his fasting sugars are now right around 100 or less.  He denies any hypoglycemic episodes.  He is very happy with how things are going. Observations/Objective: BP 133/74   Wt 214 lb (97.1 kg)   BMI 35.61 kg/m  Wt Readings from Last 5 Encounters:  07/20/19 214 lb (97.1 kg)  05/11/19 221 lb (100.2 kg)  04/25/19 225 lb (102.1 kg)  04/06/19 222 lb (100.7 kg)  03/20/19 227 lb (103 kg)   Exam: Appearance nontoxic no acute distress Normal  Speech.    Lab and Radiology Results Recent labs and imaging reviewed  Assessment and Plan: 53 y.o. male with myalgias and fatigue with exercise.  Etiology is somewhat unclear here.  Certainly could be some underlying metabolic process.  Will check CBC with differential, CMP.  Also check TSH and CK and sed rate to evaluate for myopathy/myositis.  Will check vitamin D as sometimes that can be associated with muscle soreness.  Also follow-up diabetes and vitamin D deficiency with A1c and vitamin D.  Assuming labs are unremarkable neck step will be trial of discontinuation of atorvastatin.  Will recommend patient stop it for a few weeks and restarted and see if his muscle pain improves off of the atorvastatin and returns on the atorvastatin.  Additionally patient is due for flu vaccine.  He notes that he can swing by today and get the flu shot while he is getting his labs.  PDMP not reviewed this encounter. Orders Placed This Encounter  Procedures  . CBC with Differential/Platelet  . COMPLETE METABOLIC PANEL WITH GFR  . Hemoglobin A1c  . VITAMIN D 25 Hydroxy (Vit-D Deficiency, Fractures)  . Uric acid  . TSH  . Sedimentation rate  . CK   Meds ordered this encounter  Medications  . metFORMIN (GLUCOPHAGE) 1000 MG tablet    Sig: Take 1 tablet (1,000 mg total) by mouth 2 (two) times daily with a meal.  Dispense:  180 tablet    Refill:  3  . phentermine (ADIPEX-P) 37.5 MG tablet    Sig: One tab by mouth qAM    Dispense:  30 tablet    Refill:  0    Follow Up Instructions:    I discussed the assessment and treatment plan with the patient. The patient was provided an opportunity to ask questions and all were answered. The patient agreed with the plan and demonstrated an understanding of the instructions.   The patient was advised to call back or seek an in-person evaluation if the symptoms worsen or if the condition fails to improve as anticipated.  Time: 25 minutes of intraservice  time, with >39 minutes of total time during today's visit.      Historical information moved to improve visibility of documentation.  Past Medical History:  Diagnosis Date  . Essential hypertension 12/04/2013  . Type 2 diabetes mellitus ([facility]) 12/04/2013   Past Surgical History:  Procedure Laterality Date  . ANTERIOR CERVICAL DECOMPRESSION/DISCECTOMY FUSION 4 LEVELS N/A 09/07/2016   Procedure: Anterior Cervical Diskectomy Fusion - Cervical three- Cervical four - Cervical four - Cervical five - Cervical five- Cervical six  - Cervical six - Cervical seven;  Surgeon: Julio SicksHenry Pool, MD;  [facility];  Service: Neurosurgery;  Laterality: N/A;  . KNEE ARTHROSCOPY Left   . spinal cord surgery     20+ yrs ago   Social History   Tobacco Use  . Smoking status: Former Games developermoker  . Smokeless tobacco: Never Used  Substance Use Topics  . Alcohol use: No    Alcohol/week: 0.0 standard drinks   family history includes Alcohol abuse in his father and mother; Cancer in his mother and another family member; Diabetes in his father; Hypertension in his father.  Medications: Current Outpatient Medications  Medication Sig Dispense Refill  . AMBULATORY NON FORMULARY MEDICATION 3ml syringe, 21g 1.5 inch needles and 18 gauge blunt filler needles for testosterone injections every 2 weeks. Disp qs x 3 months. 1 each 0  . AMBULATORY NON FORMULARY MEDICATION Single glucometer with lancets, test strips. Test daily. E11.8 1 each 0  . amLODipine (NORVASC) 10 MG tablet TAKE 1 TABLET BY MOUTH EVERY DAY 90 tablet 0  . atorvastatin (LIPITOR) 40 MG tablet TAKE 1 TABLET BY MOUTH EVERY DAY 90 tablet 1  . benazepril (LOTENSIN) 20 MG tablet Take 1 tablet by mouth daily. 90 tablet 3  . dapagliflozin propanediol (FARXIGA) 10 MG TABS tablet Take 10 mg by mouth daily. 90 tablet 1  . Ertugliflozin L-PyroglutamicAc (STEGLATRO) 15 MG TABS Steglatro 15 mg tablet    . insulin degludec (TRESIBA FLEXTOUCH) 100 UNIT/ML SOPN FlexTouch Pen  Inject 0.1 mLs (10 Units total) into the skin daily. 5 pen 12  . Insulin Pen Needle (ADVOCATE INSULIN PEN NEEDLES) 33G X 4 MM MISC 1 each by Does not apply route daily. Use with insulin pen daily 100 each 12  . metFORMIN (GLUCOPHAGE) 1000 MG tablet Take 1 tablet (1,000 mg total) by mouth 2 (two) times daily with a meal. 180 tablet 3  . phentermine (ADIPEX-P) 37.5 MG tablet One tab by mouth qAM 30 tablet 0  . tadalafil (CIALIS) 5 MG tablet TAKE ONE TABLET BY MOUTH DAILY 30 tablet 8  . tamsulosin (FLOMAX) 0.4 MG CAPS capsule Take 1 capsule (0.4 mg total) by mouth daily after breakfast. 90 capsule 3  . TRULICITY 1.5 MG/0.5ML SOPN INJECT 1.5 MG INTO THE SKIN ONCE A WEEK. 6 pen 5  No current facility-administered medications for this visit.    Allergies  Allergen Reactions  . Penicillins Swelling

## 2019-07-23 ENCOUNTER — Other Ambulatory Visit: Payer: Self-pay | Admitting: Family Medicine

## 2019-07-23 DIAGNOSIS — I10 Essential (primary) hypertension: Secondary | ICD-10-CM

## 2019-07-25 ENCOUNTER — Encounter: Payer: Self-pay | Admitting: Family Medicine

## 2019-07-26 ENCOUNTER — Other Ambulatory Visit: Payer: Self-pay

## 2019-07-26 ENCOUNTER — Encounter: Payer: Self-pay | Admitting: Family Medicine

## 2019-07-26 ENCOUNTER — Ambulatory Visit (INDEPENDENT_AMBULATORY_CARE_PROVIDER_SITE_OTHER): Payer: BC Managed Care – PPO | Admitting: Family Medicine

## 2019-07-26 VITALS — BP 130/80 | HR 94 | Temp 98.0°F | Wt 226.0 lb

## 2019-07-26 DIAGNOSIS — M25511 Pain in right shoulder: Secondary | ICD-10-CM | POA: Diagnosis not present

## 2019-07-26 DIAGNOSIS — E1159 Type 2 diabetes mellitus with other circulatory complications: Secondary | ICD-10-CM | POA: Diagnosis not present

## 2019-07-26 DIAGNOSIS — Z1211 Encounter for screening for malignant neoplasm of colon: Secondary | ICD-10-CM

## 2019-07-26 DIAGNOSIS — I10 Essential (primary) hypertension: Secondary | ICD-10-CM | POA: Diagnosis not present

## 2019-07-26 DIAGNOSIS — E1169 Type 2 diabetes mellitus with other specified complication: Secondary | ICD-10-CM | POA: Diagnosis not present

## 2019-07-26 DIAGNOSIS — E118 Type 2 diabetes mellitus with unspecified complications: Secondary | ICD-10-CM | POA: Diagnosis not present

## 2019-07-26 NOTE — Patient Instructions (Addendum)
Thank you for coming in today. Do the exercises we dicussed.  External rotation and internal rotation  Up to the front and up to the side.  30 reps 2-3x daily.  If not improving let me know. We can do an injection and physical therapy and even MRI.   I will get lab results orderdd last week to you ASAP.   We will order cologuard.   Cologuard is covered by Medicare and most major insurers. INSURANCE Cologuard is covered by Medicare and Medicare Advantage with no co-pay or deductible for eligible patients.  More than 92%* of all Cologuard patients have no out of-pocket cost for screening.  Based on the [place] should be covered by most private insurers with no co-pay or deductible for eligible patients (ages 61-75; at average risk for colon cancer; without symptoms). Some exceptions may apply, so we recommend patients call their insurer to confirm.  BILLING Call Cologuard at [phone] and speak with a member of our Patient Support Team to make sure the test will be covered.   I will be moving to full time Sports Medicine in [place] starting on November 1st.  You will still be able to see me for your Sports Medicine or Orthopedic needs in the [facility] Location. I will still be part of [place].   [place], [place] [address] [phone]  Telephone (phone line will be functional starting in November).  [phone] Fax [phone] Concussion Line  If you want to stay locally for your Sports Medicine issues Dr. Dianah Field here in [place] will be happy to see you.  Additionally Dr. Clearance Coots at [facility] will be happy to see you for sports medicine issues more locally.   For your primary care needs you are welcome to establish care with Dr. Emeterio Reeve.  We are working quickly to hire more physicians to cover the primary care needs however if you cannot get an appointment with Dr.  Sheppard Coil in a timely manner [place] has locations and openings for primary care services nearby.    Primary Care at [facility] [address] . [employer] , [place]: [phone] . Behavioral Medicine: [phone] . Fax: [place] at [employer] [address] . [place], [place]: [phone] . Behavioral Medicine: [phone] . Fax: [phone] . Hours (M-F): 7am - Academic librarian At [facility]. [place], [place]: [phone] . Behavioral Medicine: [phone] . Fax: [phone] . Hours (M-F): 8am - Optician, dispensing at [employer] . [place], [place] Phone: [phone] . Behavioral Medicine: [phone] . Fax: [phone]

## 2019-07-26 NOTE — Progress Notes (Signed)
Jordan Taylor is a 53 y.o. male who [facility]: Primary Care Sports Medicine today for right shoulder pain.  Jordan Taylor notes pain in the right shoulder mostly at the lateral upper arm and posterior aspect of the shoulder present for about a month.  He felt some pain when lifting at the gym and then some pain when lifting at work.  He notes some discomfort with overhead lifting.  Pain is mild but does interfere with his quality of life some.  He denies significant radiating pain weakness or numbness distally to his right arm.  Additionally patient notes that he is due for colon cancer screening with Cologuard.  Last test was July 2017.  ROS as above:  Exam:  BP 130/80   Pulse 94   Temp 98 F (36.7 C) (Oral)   Wt 226 lb (102.5 kg)   BMI 37.61 kg/m  Wt Readings from Last 5 Encounters:  07/26/19 226 lb (102.5 kg)  07/20/19 214 lb (97.1 kg)  05/11/19 221 lb (100.2 kg)  04/25/19 225 lb (102.1 kg)  04/06/19 222 lb (100.7 kg)    Gen: Well NAD HEENT: EOMI,  MMM Lungs: Normal work of breathing. CTABL Heart: RRR no MRG Abd: NABS, Soft. Nondistended, Nontender Exts: Brisk capillary refill, warm and well perfused.  MSK: Right shoulder normal-appearing nontender.  Normal motion. Strength intact abduction and internal rotation.  Weak and somewhat painful 4/5 to external rotation. Negative Hawkins and Neer's test.  Negative empty can test. Pulses cap refill sensation are right upper extremity.  Lab and Radiology Results Limited musculoskeletal ultrasound right shoulder: Biceps tendon intact and in groove Subscapularis tendon intact appearing normal-appearing Supraspinatus tendon: Intact relatively normal-appearing increased subacromial bursa thickness present. Infraspinatus tendon is intact appearing AC joint moderate effusion But a structure is normal otherwise. Impression:  Subacromial bursitis   Assessment and Plan: 53 y.o. male with right shoulder pain.  Predominant physical exam finding is some weakness to external rotation of the shoulder associate with pain.  Infraspinatus tendon however looks pretty normal on ultrasound per my read.  He did have evidence of subacromial bursitis on ultrasound however.  Discussed options.  Plan for home exercise program with possible referral to physical therapy if not improving.  Cortisone injection would also be a possibility but given his diabetes would like to avoid if possible.  Patient will keep me updated with how things are going.  Additionally patient had labs that were ordered via video visit recently which will be pending shortly.    Additionally patient is due for repeat Cologuard for colon cancer screening.  Cologuard ordered.  I informed patient that I am transitioning to sports medicine only [place] [facility] medicine in [place] starting in November.  Happy to see patient for continued sports medicine needs.  Discussed need for new PCP.  Provided some recommendations.   PDMP not reviewed this encounter. Orders Placed This Encounter  Procedures  . Cologuard   No orders of the defined types were placed in this encounter.    Historical information moved to improve visibility of documentation.  Past Medical History:  Diagnosis Date  . Essential hypertension 12/04/2013  . Type 2 diabetes mellitus ([facility]) 12/04/2013   Past Surgical History:  Procedure Laterality Date  . ANTERIOR CERVICAL DECOMPRESSION/DISCECTOMY FUSION 4 LEVELS N/A 09/07/2016   Procedure: Anterior Cervical Diskectomy Fusion - Cervical three- Cervical four - Cervical four - Cervical five - Cervical five- Cervical six  - Cervical six -  Cervical seven;  Surgeon: Earnie Larsson, MD;  Location: [place];  Service: Neurosurgery;  Laterality: N/A;  . KNEE ARTHROSCOPY Left   . spinal cord surgery     20+ yrs ago   Social History   Tobacco Use  . Smoking  status: Former Research scientist (life sciences)  . Smokeless tobacco: Never Used  Substance Use Topics  . Alcohol use: No    Alcohol/week: 0.0 standard drinks   family history includes Alcohol abuse in his father and mother; Cancer in his mother and another family member; Diabetes in his father; Hypertension in his father.  Medications: Current Outpatient Medications  Medication Sig Dispense Refill  . AMBULATORY NON FORMULARY MEDICATION 63ml syringe, 21g 1.5 inch needles and 18 gauge blunt filler needles for testosterone injections every 2 weeks. Disp qs x 3 months. 1 each 0  . AMBULATORY NON FORMULARY MEDICATION Single glucometer with lancets, test strips. Test daily. E11.8 1 each 0  . amLODipine (NORVASC) 10 MG tablet TAKE 1 TABLET BY MOUTH EVERY DAY 90 tablet 1  . atorvastatin (LIPITOR) 40 MG tablet TAKE 1 TABLET BY MOUTH EVERY DAY 90 tablet 1  . benazepril (LOTENSIN) 20 MG tablet Take 1 tablet by mouth daily. 90 tablet 3  . dapagliflozin propanediol (FARXIGA) 10 MG TABS tablet Take 10 mg by mouth daily. 90 tablet 1  . Ertugliflozin L-PyroglutamicAc (STEGLATRO) 15 MG TABS Steglatro 15 mg tablet    . insulin degludec (TRESIBA FLEXTOUCH) 100 UNIT/ML SOPN FlexTouch Pen Inject 0.1 mLs (10 Units total) into the skin daily. 5 pen 12  . Insulin Pen Needle (ADVOCATE INSULIN PEN NEEDLES) 33G X 4 MM MISC 1 each by Does not apply route daily. Use with insulin pen daily 100 each 12  . metFORMIN (GLUCOPHAGE) 1000 MG tablet Take 1 tablet (1,000 mg total) by mouth 2 (two) times daily with a meal. 180 tablet 3  . phentermine (ADIPEX-P) 37.5 MG tablet One tab by mouth qAM 30 tablet 0  . tadalafil (CIALIS) 5 MG tablet TAKE ONE TABLET BY MOUTH DAILY 30 tablet 8  . tamsulosin (FLOMAX) 0.4 MG CAPS capsule Take 1 capsule (0.4 mg total) by mouth daily after breakfast. 90 capsule 3  . TRULICITY 1.5 GB/1.5VV SOPN INJECT 1.5 MG INTO THE SKIN ONCE A WEEK. 6 pen 5   No current facility-administered medications for this visit.    Allergies   Allergen Reactions  . Penicillins Swelling     Discussed warning signs or symptoms. Please see discharge instructions. Patient expresses understanding.

## 2019-07-27 ENCOUNTER — Encounter: Payer: Self-pay | Admitting: Family Medicine

## 2019-07-27 LAB — CBC WITH DIFFERENTIAL/PLATELET
Absolute Monocytes: 738 cells/uL (ref 200–950)
Basophils Absolute: 42 cells/uL (ref 0–200)
Basophils Relative: 0.4 %
Eosinophils Absolute: 333 cells/uL (ref 15–500)
Eosinophils Relative: 3.2 %
HCT: 42.1 % (ref 38.5–50.0)
Hemoglobin: 14.2 g/dL (ref 13.2–17.1)
Lymphs Abs: 2226 cells/uL (ref 850–3900)
MCH: 29.5 pg (ref 27.0–33.0)
MCHC: 33.7 g/dL (ref 32.0–36.0)
MCV: 87.3 fL (ref 80.0–100.0)
MPV: 9.9 fL (ref 7.5–12.5)
Monocytes Relative: 7.1 %
Neutro Abs: 7062 cells/uL (ref 1500–7800)
Neutrophils Relative %: 67.9 %
Platelets: 262 10*3/uL (ref 140–400)
RBC: 4.82 10*6/uL (ref 4.20–5.80)
RDW: 15.2 % — ABNORMAL HIGH (ref 11.0–15.0)
Total Lymphocyte: 21.4 %
WBC: 10.4 10*3/uL (ref 3.8–10.8)

## 2019-07-27 LAB — COMPLETE METABOLIC PANEL WITH GFR
AG Ratio: 1.6 (calc) (ref 1.0–2.5)
ALT: 50 U/L — ABNORMAL HIGH (ref 9–46)
AST: 28 U/L (ref 10–35)
Albumin: 4.1 g/dL (ref 3.6–5.1)
Alkaline phosphatase (APISO): 149 U/L — ABNORMAL HIGH (ref 35–144)
BUN: 13 mg/dL (ref 7–25)
CO2: 24 mmol/L (ref 20–32)
Calcium: 9.1 mg/dL (ref 8.6–10.3)
Chloride: 106 mmol/L (ref 98–110)
Creat: 1.05 mg/dL (ref 0.70–1.33)
GFR, Est African American: 92 mL/min/{1.73_m2} (ref >=60)
GFR, Est Non African American: 80 mL/min/{1.73_m2} (ref >=60)
Globulin: 2.6 g/dL (calc) (ref 1.9–3.7)
Glucose, Bld: 99 mg/dL (ref 65–99)
Potassium: 4.6 mmol/L (ref 3.5–5.3)
Sodium: 139 mmol/L (ref 135–146)
Total Bilirubin: 0.6 mg/dL (ref 0.2–1.2)
Total Protein: 6.7 g/dL (ref 6.1–8.1)

## 2019-07-27 LAB — HEMOGLOBIN A1C
Hgb A1c MFr Bld: 7.3 % of total Hgb — ABNORMAL HIGH (ref <5.7)
Mean Plasma Glucose: 163 (calc)
eAG (mmol/L): 9 (calc)

## 2019-07-27 LAB — TSH: TSH: 0.99 mIU/L (ref 0.40–4.50)

## 2019-07-27 LAB — VITAMIN D 25 HYDROXY (VIT D DEFICIENCY, FRACTURES): Vit D, 25-Hydroxy: 40 ng/mL (ref 30–100)

## 2019-07-27 LAB — URIC ACID: Uric Acid, Serum: 7 mg/dL (ref 4.0–8.0)

## 2019-07-27 LAB — SEDIMENTATION RATE: Sed Rate: 2 mm/h (ref 0–20)

## 2019-07-27 LAB — CK: Total CK: 577 U/L — ABNORMAL HIGH (ref 44–196)

## 2019-08-23 DIAGNOSIS — E119 Type 2 diabetes mellitus without complications: Secondary | ICD-10-CM | POA: Diagnosis not present

## 2019-08-23 LAB — HM DIABETES EYE EXAM

## 2019-08-29 ENCOUNTER — Telehealth: Payer: Self-pay | Admitting: Family Medicine

## 2019-08-29 NOTE — Telephone Encounter (Signed)
Received diabetic eye exam.  Date of service 08/23/2019.  No diabetic retinopathy.  Result will be sent abstract.

## 2019-08-30 ENCOUNTER — Telehealth: Payer: Self-pay | Admitting: Family Medicine

## 2019-08-30 DIAGNOSIS — R7401 Elevation of levels of liver transaminase levels: Secondary | ICD-10-CM

## 2019-08-30 DIAGNOSIS — R748 Abnormal levels of other serum enzymes: Secondary | ICD-10-CM

## 2019-08-30 DIAGNOSIS — M791 Myalgia, unspecified site: Secondary | ICD-10-CM

## 2019-08-30 NOTE — Telephone Encounter (Signed)
Plan to recheck CK and liver enzymes after stopping Lipitor.  Please get labs in near future.  Lab does not need to be fasting.

## 2019-08-30 NOTE — Telephone Encounter (Signed)
-----   Message from Gregor Hams, MD sent at 07/27/2019  6:47 AM EDT ----- Regarding: Check CK and liver enzymes 1 month after stopping Lipitor Check CK and liver enzymes 1 month after stopping Lipitor.  CK was elevated and liver enzymes are elevated.

## 2019-08-30 NOTE — Telephone Encounter (Signed)
Left pt msg to get labs done  

## 2019-09-08 ENCOUNTER — Ambulatory Visit (INDEPENDENT_AMBULATORY_CARE_PROVIDER_SITE_OTHER): Payer: BC Managed Care – PPO | Admitting: Family Medicine

## 2019-09-08 ENCOUNTER — Other Ambulatory Visit: Payer: Self-pay

## 2019-09-08 VITALS — BP 131/91 | HR 114 | Wt 217.0 lb

## 2019-09-08 DIAGNOSIS — R748 Abnormal levels of other serum enzymes: Secondary | ICD-10-CM | POA: Diagnosis not present

## 2019-09-08 DIAGNOSIS — R635 Abnormal weight gain: Secondary | ICD-10-CM

## 2019-09-08 DIAGNOSIS — M791 Myalgia, unspecified site: Secondary | ICD-10-CM | POA: Diagnosis not present

## 2019-09-08 MED ORDER — PHENTERMINE HCL 37.5 MG PO TABS: ORAL_TABLET | ORAL | 0 refills | Status: DC

## 2019-09-08 NOTE — Progress Notes (Signed)
Heart rate well controlled at home and previously on this medication.  Continue home heart rate log and refill medication recheck 1 month nurse visit or in person.

## 2019-09-08 NOTE — Progress Notes (Signed)
Patient is here for blood pressure and weigh check. Denies trouble sleeping, palpitations, or new medication problems.   Patient has lost 9 pounds. His pulse was elevated at 113 and 114 on recheck. Per Dr. Georgina Snell, okay to refill phentermine. Patient will monitor at home and let us know if consistently elevated or if develops any palpitations/other issues. A refill for phentermine will sent to the pharmacy. Patient advised to schedule a follow up with nurse in 30 days.

## 2019-09-09 LAB — COMPLETE METABOLIC PANEL WITH GFR
AG Ratio: 1.5 (calc) (ref 1.0–2.5)
ALT: 49 U/L — ABNORMAL HIGH (ref 9–46)
AST: 23 U/L (ref 10–35)
Albumin: 4.3 g/dL (ref 3.6–5.1)
Alkaline phosphatase (APISO): 165 U/L — ABNORMAL HIGH (ref 35–144)
BUN: 18 mg/dL (ref 7–25)
CO2: 23 mmol/L (ref 20–32)
Calcium: 9.4 mg/dL (ref 8.6–10.3)
Chloride: 108 mmol/L (ref 98–110)
Creat: 1.03 mg/dL (ref 0.70–1.33)
GFR, Est African American: 94 mL/min/{1.73_m2} (ref >=60)
GFR, Est Non African American: 81 mL/min/{1.73_m2} (ref >=60)
Globulin: 2.9 g/dL (calc) (ref 1.9–3.7)
Glucose, Bld: 234 mg/dL — ABNORMAL HIGH (ref 65–99)
Potassium: 4.4 mmol/L (ref 3.5–5.3)
Sodium: 141 mmol/L (ref 135–146)
Total Bilirubin: 0.6 mg/dL (ref 0.2–1.2)
Total Protein: 7.2 g/dL (ref 6.1–8.1)

## 2019-09-09 LAB — CK: Total CK: 416 U/L — ABNORMAL HIGH (ref 44–196)

## 2019-09-11 ENCOUNTER — Encounter: Payer: Self-pay | Admitting: Family Medicine

## 2019-09-11 MED ORDER — FLUCONAZOLE 150 MG PO TABS: ORAL_TABLET | ORAL | 1 refills | Status: DC

## 2019-09-12 ENCOUNTER — Encounter: Payer: Self-pay | Admitting: Family Medicine

## 2019-10-10 ENCOUNTER — Other Ambulatory Visit: Payer: Self-pay | Admitting: Family Medicine

## 2019-11-02 ENCOUNTER — Encounter: Payer: Self-pay | Admitting: Family Medicine

## 2019-11-03 MED ORDER — BENAZEPRIL HCL 20 MG PO TABS: ORAL_TABLET | ORAL | 0 refills | Status: DC

## 2019-11-04 ENCOUNTER — Other Ambulatory Visit: Payer: Self-pay | Admitting: Family Medicine

## 2019-11-06 ENCOUNTER — Encounter: Payer: Self-pay | Admitting: Family Medicine

## 2019-11-07 MED ORDER — METFORMIN HCL 1000 MG PO TABS: ORAL_TABLET | ORAL | 3 refills | Status: DC

## 2019-11-07 NOTE — Telephone Encounter (Signed)
[facility] called [facility] and spoke with Ebony Hail. She cancelled the metformin and I sent electronically to [facility]. Mychart sent to the patient as well.

## 2019-11-19 ENCOUNTER — Other Ambulatory Visit: Payer: Self-pay | Admitting: Family Medicine

## 2019-12-03 IMAGING — MR MRI CERVICAL SPINE WITHOUT CONTRAST
5 series · 33 of 48 positions shown · non-contrast
Comparison: 07/07/2016

CLINICAL DATA: Pt thinks that he originally injured his neck by
jumping out of airplanes in the [employer]. C-Spine sx X 2 (about 30
years and in July 2016. Left hand has limited use and he has
tingling in the right arm.

EXAM:
MRI CERVICAL SPINE WITHOUT CONTRAST
TECHNIQUE: Multiplanar, multisequence MR imaging of the cervical spine was
performed. No intravenous contrast was administered.

[Series 2: T2 · sagittal · 3.0mm · 0.56mm/px · 6 of 13 slices shown (1 of 2)]
[im 1/13]
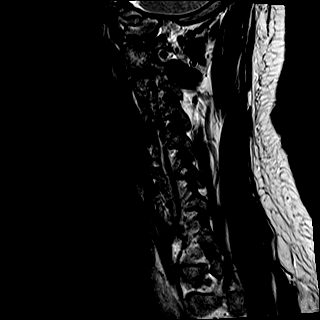
[im 3/13]
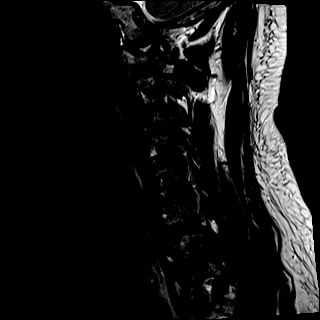
[im 5/13]
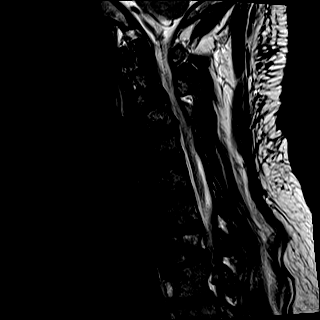
[im 8/13]
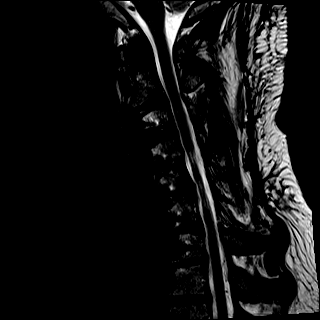
[im 10/13]
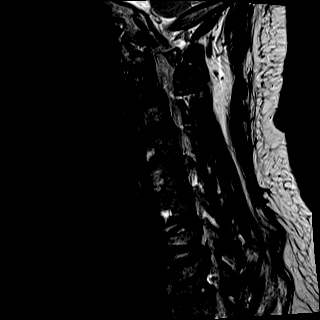
[im 13/13]
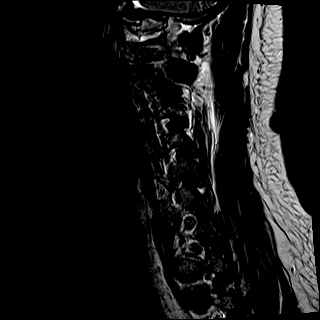

[Series 3: T1 · sagittal · 3.0mm · 0.70mm/px · 6 of 13 slices shown]
[im 1/13]
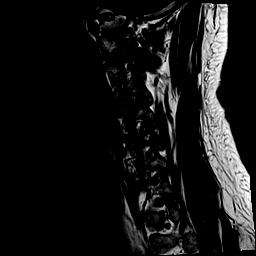
[im 3/13]
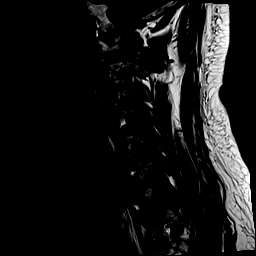
[im 5/13]
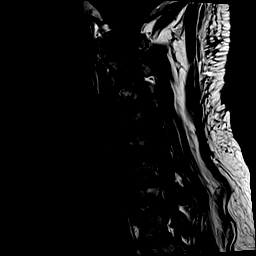
[im 8/13]
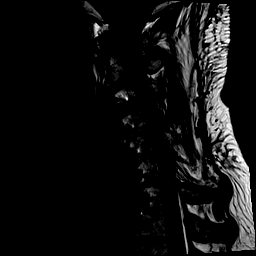
[im 10/13]
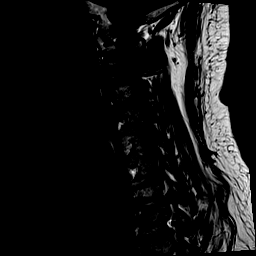
[im 13/13]
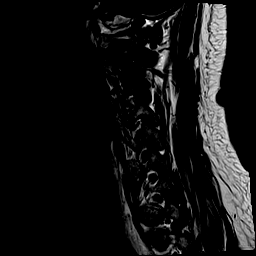

[Series 4: STIR · sagittal · 3.0mm · 0.35mm/px · 6 of 13 slices shown]
[im 1/13]
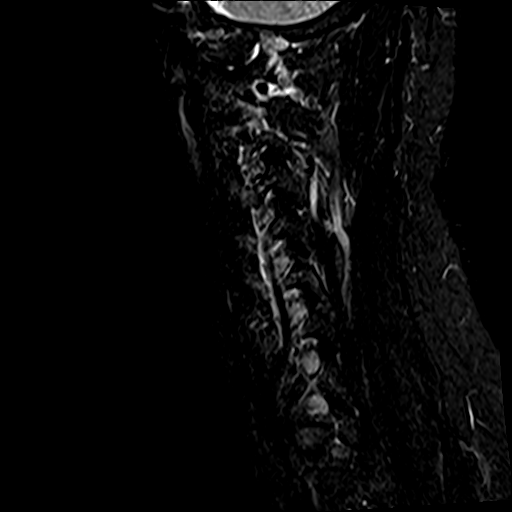
[im 3/13]
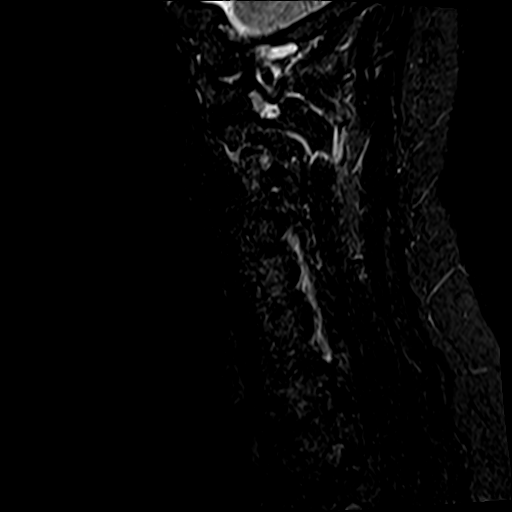
[im 5/13]
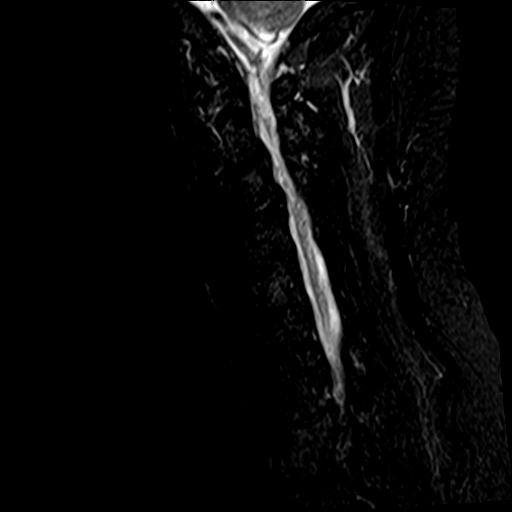
[im 8/13]
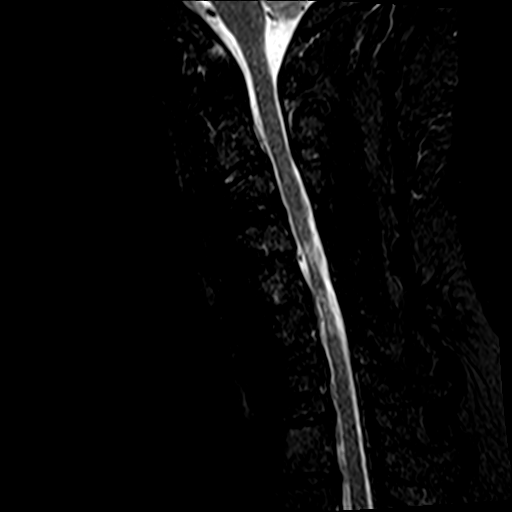
[im 10/13]
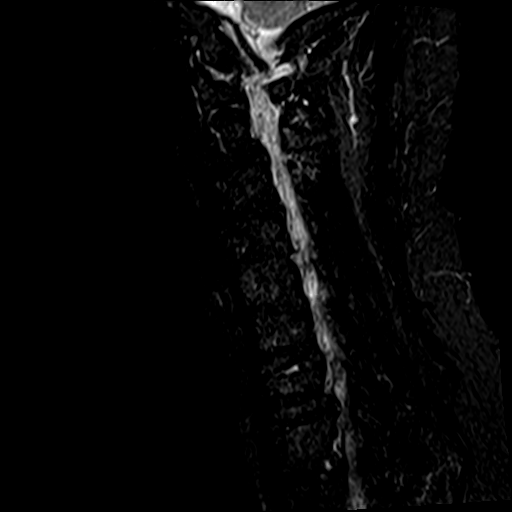
[im 13/13]
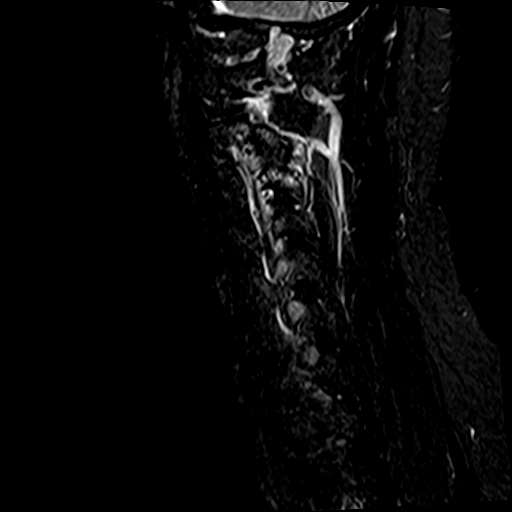

[Series 5: T2 · axial · 3.0mm · 0.62mm/px · z∈[-111,-2]mm · 9 of 30 slices shown (2 of 2)]
[im 1/30]
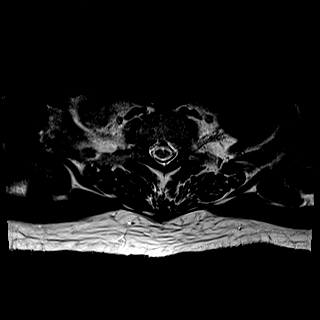
[im 5/30]
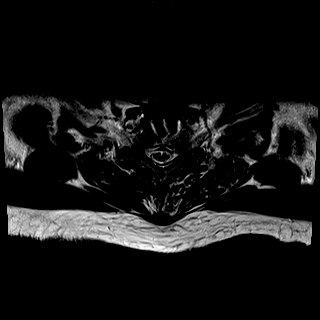
[im 9/30]
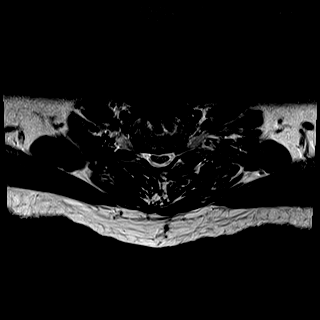
[im 13/30]
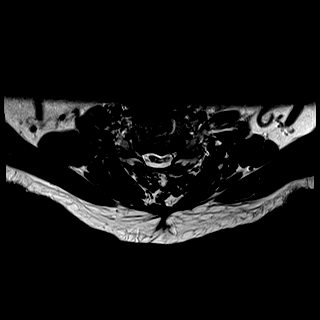
[im 15/30]
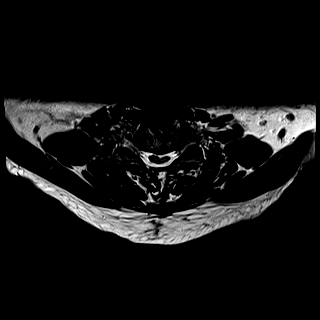
[im 17/30]
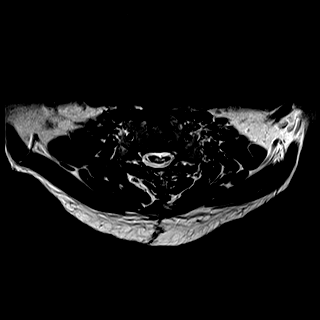
[im 21/30]
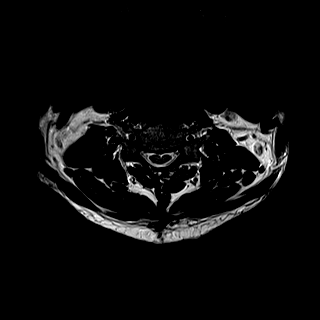
[im 25/30]
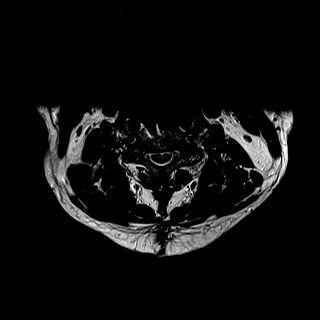
[im 30/30]
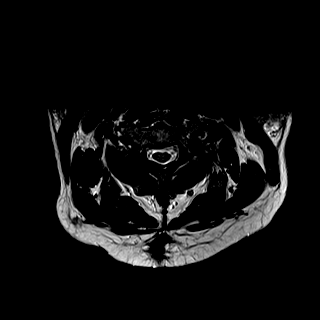

[Series 6: mpgr ax · axial · 3.0mm · 0.35mm/px · z∈[-102,-27]mm · 6 of 30 slices shown]
[im 1/30]
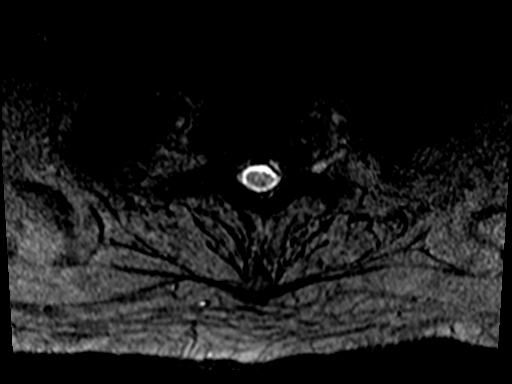
[im 5/30]
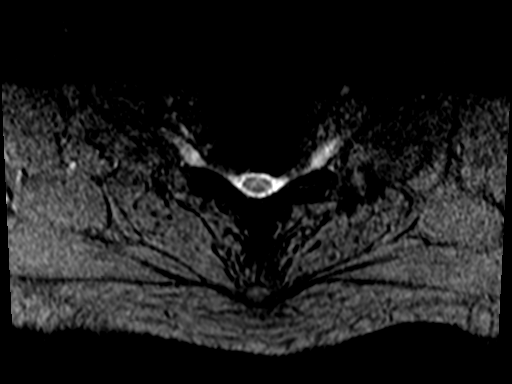
[im 9/30]
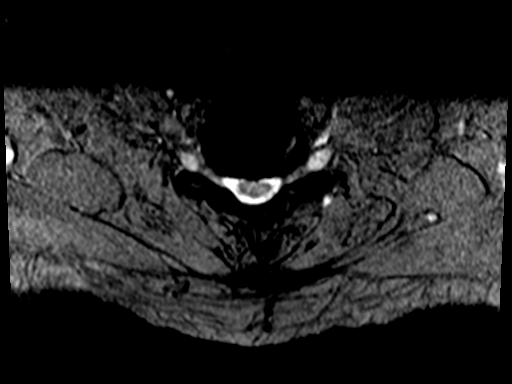
[im 13/30]
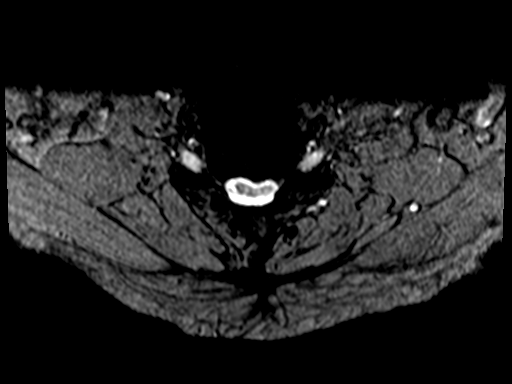
[im 17/30]
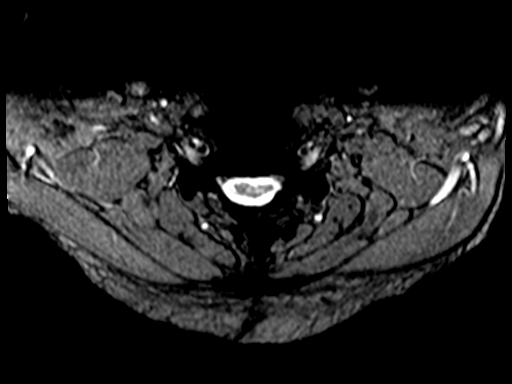
[im 21/30]
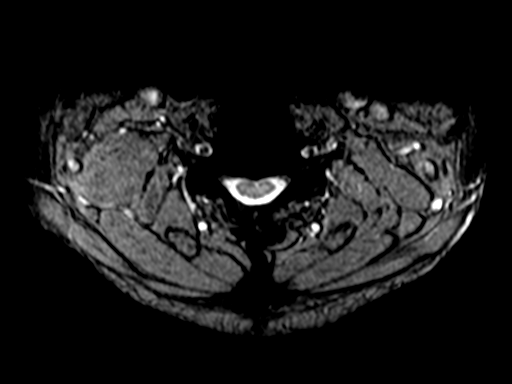

[33 of 48 positions shown; findings below may reference images not displayed]

FINDINGS: Alignment: Physiologic.

Vertebrae: No fracture, evidence of discitis, or bone lesion.

Cord: Focal T2 hyperintense signal within the cervical spinal cord
with volume loss at C4-5 and to lesser extent at C5-6 most
consistent with myelomalacia.

Posterior Fossa, vertebral arteries, paraspinal tissues: Posterior
fossa demonstrates no focal abnormality. Vertebral artery flow voids
are maintained. Paraspinal soft tissues are unremarkable.

Disc levels:

Discs: Anterior cervical fusion from C3 through C7. Mild
degenerative disc disease with disc height loss at C7-T1.

C2-3: Minimal broad-based disc bulge. Mild bilateral foraminal
stenosis. Mild left facet arthropathy. No central canal stenosis.

C3-4: Interbody fusion. Moderate bilateral foraminal stenosis. No
central canal stenosis.

C4-5: Interbody fusion. Mild bilateral foraminal stenosis. No
central canal stenosis.

C5-6: Interbody fusion with focal hypertrophic changes along the
left paracentral aspect. Mild right and moderate left foraminal
stenosis. No central canal stenosis.

C6-7: Interbody fusion. Mild left foraminal stenosis. No right
foraminal stenosis. No central canal stenosis.

C7-T1: Broad-based disc bulge. Bilateral uncovertebral degenerative
changes. Moderate-severe bilateral foraminal stenosis. No central
canal stenosis.
IMPRESSION: 1. Diffuse lumbar spine spondylosis as described above.
2. Anterior cervical fusion from C3 through C7 with foraminal
narrowing as detailed above. Degree of foraminal narrowing is
similar to the prior examination of 07/07/2016.
3. Focal T2 hyperintense signal within the cervical spinal cord with
volume loss at C4-5 and to lesser extent at C5-6 most consistent
with myelomalacia. Appearance is unchanged compared with 07/07/2016.

## 2019-12-20 ENCOUNTER — Encounter: Payer: Self-pay | Admitting: Family Medicine

## 2020-01-30 ENCOUNTER — Other Ambulatory Visit: Payer: Self-pay | Admitting: Sports Medicine

## 2020-01-30 ENCOUNTER — Other Ambulatory Visit: Payer: Self-pay | Admitting: Family Medicine

## 2020-01-30 DIAGNOSIS — I10 Essential (primary) hypertension: Secondary | ICD-10-CM

## 2020-02-13 ENCOUNTER — Other Ambulatory Visit: Payer: Self-pay | Admitting: Sports Medicine

## 2020-04-03 ENCOUNTER — Other Ambulatory Visit: Payer: Self-pay

## 2020-04-03 ENCOUNTER — Encounter: Payer: Self-pay | Admitting: Family Medicine

## 2020-04-03 ENCOUNTER — Ambulatory Visit (INDEPENDENT_AMBULATORY_CARE_PROVIDER_SITE_OTHER): Payer: BC Managed Care – PPO | Admitting: Family Medicine

## 2020-04-03 VITALS — BP 138/82 | HR 123 | Ht 64.96 in | Wt 215.8 lb

## 2020-04-03 DIAGNOSIS — E1169 Type 2 diabetes mellitus with other specified complication: Secondary | ICD-10-CM | POA: Diagnosis not present

## 2020-04-03 DIAGNOSIS — I10 Essential (primary) hypertension: Secondary | ICD-10-CM

## 2020-04-03 DIAGNOSIS — Z1211 Encounter for screening for malignant neoplasm of colon: Secondary | ICD-10-CM | POA: Diagnosis not present

## 2020-04-03 DIAGNOSIS — I152 Hypertension secondary to endocrine disorders: Secondary | ICD-10-CM

## 2020-04-03 DIAGNOSIS — E1159 Type 2 diabetes mellitus with other circulatory complications: Secondary | ICD-10-CM

## 2020-04-03 DIAGNOSIS — E118 Type 2 diabetes mellitus with unspecified complications: Secondary | ICD-10-CM | POA: Diagnosis not present

## 2020-04-03 DIAGNOSIS — E785 Hyperlipidemia, unspecified: Secondary | ICD-10-CM

## 2020-04-03 LAB — POCT GLYCOSYLATED HEMOGLOBIN (HGB A1C): Hemoglobin A1C: 14 % — AB (ref 4.0–5.6)

## 2020-04-03 MED ORDER — TADALAFIL 5 MG PO TABS: 5.0000 mg | ORAL_TABLET | Freq: Every day | ORAL | 8 refills | Status: DC

## 2020-04-03 MED ORDER — AMLODIPINE BESYLATE 10 MG PO TABS: 10.0000 mg | ORAL_TABLET | Freq: Every day | ORAL | 2 refills | Status: DC

## 2020-04-03 MED ORDER — METFORMIN HCL 1000 MG PO TABS: ORAL_TABLET | ORAL | 3 refills | Status: DC

## 2020-04-03 MED ORDER — BENAZEPRIL HCL 20 MG PO TABS: ORAL_TABLET | ORAL | 2 refills | Status: DC

## 2020-04-03 MED ORDER — FARXIGA 10 MG PO TABS: ORAL_TABLET | ORAL | 1 refills | Status: DC

## 2020-04-03 MED ORDER — TRESIBA FLEXTOUCH 100 UNIT/ML ~~LOC~~ SOPN: 10.0000 [IU] | PEN_INJECTOR | Freq: Every day | SUBCUTANEOUS | 12 refills | Status: DC

## 2020-04-03 MED ORDER — ATORVASTATIN CALCIUM 40 MG PO TABS: 40.0000 mg | ORAL_TABLET | Freq: Every day | ORAL | 2 refills | Status: DC

## 2020-04-03 MED ORDER — TRULICITY 1.5 MG/0.5ML ~~LOC~~ SOAJ: SUBCUTANEOUS | 5 refills | Status: DC

## 2020-04-03 NOTE — Progress Notes (Signed)
Jordan Taylor - 54 y.o. male MRN [id number]  Date of birth: Dec 28, 1965  Subjective No chief complaint on file.   HPI Jordan Taylor is a 54 y.o. male with history of HTN, T2DM,HLD and ED here today for follow up visit.    -HTN:  Current management with benazepril and amlodipine.  He is doing well with these medications.  BP has been well controlled with current medications  -T2DM:  Previously treated with metformin,Trulicity, farxiga and tresiba at 10 units daily.  Unfortunately he lost his job last year and was uninsured since October.  He has been off all medications since about 10/2019.  He reports blood sugars are high, around 300-400.    He does exercise some but is somewhat limited due to L sided weakness related to prior myelomalacia/myopathy from spinal canal stenosis.   -HLD:  Managed with atorvastatin.  He is tolerating this well and denies side effects.    ROS:  A comprehensive ROS was completed and negative except as noted per HPI  Allergies  Allergen Reactions  . Penicillins Swelling    Past Medical History:  Diagnosis Date  . Essential hypertension 12/04/2013  . Type 2 diabetes mellitus (HCC) 12/04/2013    Past Surgical History:  Procedure Laterality Date  . ANTERIOR CERVICAL DECOMPRESSION/DISCECTOMY FUSION 4 LEVELS N/A 09/07/2016   Procedure: Anterior Cervical Diskectomy Fusion - Cervical three- Cervical four - Cervical four - Cervical five - Cervical five- Cervical six  - Cervical six - Cervical seven;  Surgeon: Julio Sicks, MD;  [facility];  Service: Neurosurgery;  Laterality: N/A;  . KNEE ARTHROSCOPY Left   . spinal cord surgery     20+ yrs ago    Social History   Socioeconomic History  . Marital status: Married    Spouse name: [name]  . Number of children: 5  . Years of education: College  . Highest education level: Not on file  Occupational History  . Occupation: Clinical cytogeneticist: Artist  Tobacco Use  . Smoking  status: Former Games developer  . Smokeless tobacco: Never Used  Substance and Sexual Activity  . Alcohol use: No    Alcohol/week: 0.0 standard drinks  . Drug use: No  . Sexual activity: Yes    Partners: Female  Other Topics Concern  . Not on file  Social History Narrative   Lives at home w/ his wife   Right-handed   Drinks about 10 cans of soda per day      Social Determinants of Health   Financial Resource Strain:   . Difficulty of Paying Living Expenses:   Food Insecurity:   . Worried About Programme researcher, broadcasting/film/video in the Last Year:   . Barista in the Last Year:   Transportation Needs:   . Freight forwarder (Medical):   Marland Kitchen Lack of Transportation (Non-Medical):   Physical Activity:   . Days of Exercise per Week:   . Minutes of Exercise per Session:   Stress:   . Feeling of Stress :   Social Connections:   . Frequency of Communication with Friends and Family:   . Frequency of Social Gatherings with Friends and Family:   . Attends Religious Services:   . Active Member of Clubs or Organizations:   . Attends Banker Meetings:   Marland Kitchen Marital Status:     Family History  Problem Relation Age of Onset  . Alcohol abuse Mother   . Cancer Mother   .  Alcohol abuse Father   . Diabetes Father   . Hypertension Father   . Cancer Other     Health Maintenance  Topic Date Due  . COVID-19 Vaccine (1) Never done  . Fecal DNA (Cologuard)  06/06/2019  . FOOT EXAM  12/17/2019  . HEMOGLOBIN A1C  01/26/2020  . INFLUENZA VACCINE  06/30/2020  . OPHTHALMOLOGY EXAM  08/22/2020  . TETANUS/TDAP  12/10/2024  . PNEUMOCOCCAL POLYSACCHARIDE VACCINE AGE 25-64 HIGH RISK  Completed  . Hepatitis C Screening  Completed  . HIV Screening  Completed     ----------------------------------------------------------------------------------------------------------------------------------------------------------------------------------------------------------------- Physical Exam BP 138/82  (BP Location: Left Arm, Patient Position: Sitting, Cuff Size: Large)   Pulse (!) 123   Ht 5' 4.96" (1.65 m)   Wt 215 lb 12.8 oz (97.9 kg)   SpO2 99%   BMI 35.95 kg/m   Physical Exam Constitutional:      Appearance: Normal appearance.  HENT:     Head: Normocephalic and atraumatic.  Cardiovascular:     Rate and Rhythm: Normal rate and regular rhythm.  Pulmonary:     Effort: Pulmonary effort is normal.     Breath sounds: Normal breath sounds.  Skin:    General: Skin is warm and dry.  Neurological:     General: No focal deficit present.     Mental Status: He is alert.  Psychiatric:        Mood and Affect: Mood normal.        Behavior: Behavior normal.     ------------------------------------------------------------------------------------------------------------------------------------------------------------------------------------------------------------------- Assessment and Plan  Hypertension associated with diabetes ([place]) Blood pressure is at goal at for age and co-morbidities.  I recommend he continue current medications.  In addition they were instructed to follow a low sodium diet with regular exercise to help to maintain adequate control of blood pressure.    Type 2 diabetes mellitus with unspecified complications ([place]) Most recent A1c of  Lab Results  Component Value Date   HGBA1C 14.0 (A) 04/03/2020   indicates diabetes is not well controlled.  He has been off medications for several months.  Restart previous meds.  Counseled on healthy, low carb diet and recommend frequent activity to help with maintaining good control of blood sugars.    Hyperlipidemia associated with type 2 diabetes mellitus ([place]) Tolerating atorvastatin well.  Update lipid panel.     Meds ordered this encounter  Medications  . amLODipine (NORVASC) 10 MG tablet    Sig: Take 1 tablet (10 mg total) by mouth daily.    Dispense:  90 tablet    Refill:  2    .  . atorvastatin (LIPITOR) 40  MG tablet    Sig: Take 1 tablet (40 mg total) by mouth daily.    Dispense:  90 tablet    Refill:  2  . benazepril (LOTENSIN) 20 MG tablet    Sig: TAKE ONE TABLET BY MOUTH DAILY    Dispense:  90 tablet    Refill:  2  . dapagliflozin propanediol (FARXIGA) 10 MG TABS tablet    Sig: TAKE 10 MG BY MOUTH DAILY.    Dispense:  90 tablet    Refill:  1  . insulin degludec (TRESIBA FLEXTOUCH) 100 UNIT/ML FlexTouch Pen    Sig: Inject 0.1 mLs (10 Units total) into the skin daily.    Dispense:  5 pen    Refill:  12  . metFORMIN (GLUCOPHAGE) 1000 MG tablet    Sig: TAKE 1 TABLET BY MOUTH TWICE A DAY WITH A  MEAL    Dispense:  180 tablet    Refill:  3  . Dulaglutide (TRULICITY) 1.5 MG/0.5ML SOPN    Sig: INJECT 1.5 MG INTO THE SKIN ONCE A WEEK.    Dispense:  6 pen    Refill:  5  . tadalafil (CIALIS) 5 MG tablet    Sig: Take 1 tablet (5 mg total) by mouth daily.    Dispense:  30 tablet    Refill:  8    Return in about 3 months (around 07/04/2020) for HTN/DM.    This visit occurred during the SARS-CoV-2 public health emergency.  Safety protocols were in place, including screening questions prior to the visit, additional usage of staff PPE, and extensive cleaning of exam room while observing appropriate contact time as indicated for disinfecting solutions.

## 2020-04-03 NOTE — Assessment & Plan Note (Signed)
Blood pressure is at goal at for age and co-morbidities.  I recommend he continue current medications.  In addition they were instructed to follow a low sodium diet with regular exercise to help to maintain adequate control of blood pressure.   

## 2020-04-03 NOTE — Assessment & Plan Note (Signed)
Most recent A1c of  Lab Results  Component Value Date   HGBA1C 14.0 (A) 04/03/2020   indicates diabetes is not well controlled.  He has been off medications for several months.  Restart previous meds.  Counseled on healthy, low carb diet and recommend frequent activity to help with maintaining good control of blood sugars.

## 2020-04-03 NOTE — Assessment & Plan Note (Signed)
Tolerating atorvastatin well.  Update lipid panel.  

## 2020-04-03 NOTE — Patient Instructions (Signed)
Great to meet you today! Let's get you restarted on your medications Have labs completed. We'll be in touch with results.  See me again in about 3 months.

## 2020-04-06 ENCOUNTER — Encounter: Payer: Self-pay | Admitting: Family Medicine

## 2020-04-06 LAB — COMPLETE METABOLIC PANEL WITH GFR
AG Ratio: 1.6 (calc) (ref 1.0–2.5)
ALT: 38 U/L (ref 9–46)
AST: 21 U/L (ref 10–35)
Albumin: 4 g/dL (ref 3.6–5.1)
Alkaline phosphatase (APISO): 169 U/L — ABNORMAL HIGH (ref 35–144)
BUN/Creatinine Ratio: 11 (calc) (ref 6–22)
BUN: 18 mg/dL (ref 7–25)
CO2: 22 mmol/L (ref 20–32)
Calcium: 9.5 mg/dL (ref 8.6–10.3)
Chloride: 94 mmol/L — ABNORMAL LOW (ref 98–110)
Creat: 1.64 mg/dL — ABNORMAL HIGH (ref 0.70–1.33)
GFR, Est African American: 54 mL/min/{1.73_m2} — ABNORMAL LOW (ref >=60)
GFR, Est Non African American: 46 mL/min/{1.73_m2} — ABNORMAL LOW (ref >=60)
Globulin: 2.5 g/dL (calc) (ref 1.9–3.7)
Glucose, Bld: 655 mg/dL (ref 65–99)
Potassium: 4.9 mmol/L (ref 3.5–5.3)
Sodium: 129 mmol/L — ABNORMAL LOW (ref 135–146)
Total Bilirubin: 0.4 mg/dL (ref 0.2–1.2)
Total Protein: 6.5 g/dL (ref 6.1–8.1)

## 2020-04-06 LAB — LIPID PANEL
Cholesterol: 165 mg/dL (ref <200)
HDL: 32 mg/dL — ABNORMAL LOW (ref >=40)
Non-HDL Cholesterol (Calc): 133 mg/dL (calc) — ABNORMAL HIGH (ref <130)
Total CHOL/HDL Ratio: 5.2 (calc) — ABNORMAL HIGH (ref <5.0)
Triglycerides: 466 mg/dL — ABNORMAL HIGH (ref <150)

## 2020-04-08 ENCOUNTER — Other Ambulatory Visit: Payer: Self-pay | Admitting: Family Medicine

## 2020-04-08 DIAGNOSIS — N401 Enlarged prostate with lower urinary tract symptoms: Secondary | ICD-10-CM

## 2020-04-08 MED ORDER — TAMSULOSIN HCL 0.4 MG PO CAPS: 0.4000 mg | ORAL_CAPSULE | Freq: Every day | ORAL | 3 refills | Status: DC

## 2020-04-09 ENCOUNTER — Encounter: Payer: Self-pay | Admitting: Family Medicine

## 2020-04-10 ENCOUNTER — Telehealth: Payer: Self-pay

## 2020-04-10 NOTE — Telephone Encounter (Signed)
Jordan Taylor is aware we have samples in the refrigerator of Trulicity 1.5 mg/ 0.5 ml 2 boxes with 2 pens per box.

## 2020-04-15 ENCOUNTER — Ambulatory Visit: Payer: BC Managed Care – PPO | Admitting: Family Medicine

## 2020-04-16 ENCOUNTER — Telehealth: Payer: BC Managed Care – PPO | Admitting: Family Medicine

## 2020-05-13 DIAGNOSIS — Z1211 Encounter for screening for malignant neoplasm of colon: Secondary | ICD-10-CM | POA: Diagnosis not present

## 2020-05-13 DIAGNOSIS — Z1212 Encounter for screening for malignant neoplasm of rectum: Secondary | ICD-10-CM | POA: Diagnosis not present

## 2020-05-18 LAB — COLOGUARD: Cologuard: NEGATIVE

## 2020-05-23 ENCOUNTER — Telehealth: Payer: Self-pay | Admitting: Neurology

## 2020-05-23 NOTE — Telephone Encounter (Signed)
Patient made aware Cologuard negative. Will call with any issues.

## 2020-06-03 ENCOUNTER — Encounter: Payer: Self-pay | Admitting: Family Medicine

## 2020-06-04 ENCOUNTER — Other Ambulatory Visit: Payer: Self-pay

## 2020-06-04 MED ORDER — DAPAGLIFLOZIN PROPANEDIOL 10 MG PO TABS: ORAL_TABLET | ORAL | 1 refills | Status: DC

## 2020-07-04 ENCOUNTER — Ambulatory Visit (INDEPENDENT_AMBULATORY_CARE_PROVIDER_SITE_OTHER): Payer: BC Managed Care – PPO | Admitting: Family Medicine

## 2020-07-04 ENCOUNTER — Encounter: Payer: Self-pay | Admitting: Family Medicine

## 2020-07-04 ENCOUNTER — Other Ambulatory Visit: Payer: Self-pay

## 2020-07-04 VITALS — BP 124/75 | HR 101 | Wt 215.0 lb

## 2020-07-04 DIAGNOSIS — E1159 Type 2 diabetes mellitus with other circulatory complications: Secondary | ICD-10-CM | POA: Diagnosis not present

## 2020-07-04 DIAGNOSIS — E1169 Type 2 diabetes mellitus with other specified complication: Secondary | ICD-10-CM

## 2020-07-04 DIAGNOSIS — I152 Hypertension secondary to endocrine disorders: Secondary | ICD-10-CM

## 2020-07-04 DIAGNOSIS — I10 Essential (primary) hypertension: Secondary | ICD-10-CM

## 2020-07-04 DIAGNOSIS — E785 Hyperlipidemia, unspecified: Secondary | ICD-10-CM

## 2020-07-04 DIAGNOSIS — E118 Type 2 diabetes mellitus with unspecified complications: Secondary | ICD-10-CM

## 2020-07-04 LAB — POCT GLYCOSYLATED HEMOGLOBIN (HGB A1C): Hemoglobin A1C: 7.9 % — AB (ref 4.0–5.6)

## 2020-07-04 MED ORDER — PHENTERMINE HCL 37.5 MG PO TABS: ORAL_TABLET | ORAL | 1 refills | Status: DC

## 2020-07-04 MED ORDER — TRULICITY 3 MG/0.5ML ~~LOC~~ SOAJ: 3.0000 mg | SUBCUTANEOUS | 1 refills | Status: DC

## 2020-07-04 NOTE — Assessment & Plan Note (Signed)
Will restart phentermine as this has worked well for him previously.

## 2020-07-04 NOTE — Assessment & Plan Note (Signed)
Blood sugar control has improved significantly since last visit.  Congratulated on improvement and encouraged continued exercise and weight loss.  Titrate trulicity to 3mg /week.  Continue additional medications at current dose.

## 2020-07-04 NOTE — Patient Instructions (Signed)
Continue working on increasing activity.  Increase trulicity to 3mg  weekly.  New prescription sent in.   See me again in 3 months.

## 2020-07-04 NOTE — Assessment & Plan Note (Signed)
Tolerating atorvastatin well, continue.  

## 2020-07-04 NOTE — Assessment & Plan Note (Signed)
Blood pressure is at goal at for age and co-morbidities.  I recommend continuation of current medications.  In addition they were instructed to follow a low sodium diet with regular exercise to help to maintain adequate control of blood pressure.  ? ?

## 2020-07-04 NOTE — Progress Notes (Signed)
Jordan Taylor - 54 y.o. male MRN [id number]  Date of birth: 17-Sep-1966  Subjective Chief Complaint  Patient presents with  . Hypertension  . Diabetes    HPI Jordan Taylor is 54 y.o. male with history of HTN, T2DM, BPH and HLD here today for follow up.  -HTN:  He is doing well with current medications.   BP readings at home have been well controlled.  He denies symptoms including chest pain, shortness of breath, palpitations, headache or vision changes.   -DM:  Poorly controlled at last visit but he had been off medications for several months due to being uninsured.  He has since restarted medications and blood sugars have improved significantly. Readings 120-130 in the mornings.  Denies symptoms of hypoglycemia.  He has been walking more as well.  He would like to restart phentermine to help with weight loss.  He did well with this previously.    -HLD:  Tolerating atorvastatin well.  No myalgias or abdominal pain.   -BPH:  Well managed with flomax.   ROS:  A comprehensive ROS was completed and negative except as noted per HPI  Allergies  Allergen Reactions  . Penicillins Swelling    Past Medical History:  Diagnosis Date  . Essential hypertension 12/04/2013  . Type 2 diabetes mellitus (HCC) 12/04/2013    Past Surgical History:  Procedure Laterality Date  . ANTERIOR CERVICAL DECOMPRESSION/DISCECTOMY FUSION 4 LEVELS N/A 09/07/2016   Procedure: Anterior Cervical Diskectomy Fusion - Cervical three- Cervical four - Cervical four - Cervical five - Cervical five- Cervical six  - Cervical six - Cervical seven;  Surgeon: Julio Sicks, MD;  Location: [facility] OR;  Service: Neurosurgery;  Laterality: N/A;  . KNEE ARTHROSCOPY Left   . spinal cord surgery     20+ yrs ago    Social History   Socioeconomic History  . Marital status: Married    Spouse name: [name]  . Number of children: 5  . Years of education: College  . Highest education level: Not on file  Occupational History  .  Occupation: Clinical cytogeneticist: Artist  Tobacco Use  . Smoking status: Former Games developer  . Smokeless tobacco: Never Used  Substance and Sexual Activity  . Alcohol use: No    Alcohol/week: 0.0 standard drinks  . Drug use: No  . Sexual activity: Yes    Partners: Female  Other Topics Concern  . Not on file  Social History Narrative   Lives at home w/ his wife   Right-handed   Drinks about 10 cans of soda per day      Social Determinants of Health   Financial Resource Strain:   . Difficulty of Paying Living Expenses:   Food Insecurity:   . Worried About Programme researcher, broadcasting/film/video in the Last Year:   . Barista in the Last Year:   Transportation Needs:   . Freight forwarder (Medical):   Marland Kitchen Lack of Transportation (Non-Medical):   Physical Activity:   . Days of Exercise per Week:   . Minutes of Exercise per Session:   Stress:   . Feeling of Stress :   Social Connections:   . Frequency of Communication with Friends and Family:   . Frequency of Social Gatherings with Friends and Family:   . Attends Religious Services:   . Active Member of Clubs or Organizations:   . Attends Banker Meetings:   Marland Kitchen Marital Status:  Family History  Problem Relation Age of Onset  . Alcohol abuse Mother   . Cancer Mother   . Alcohol abuse Father   . Diabetes Father   . Hypertension Father   . Cancer Other     Health Maintenance  Topic Date Due  . INFLUENZA VACCINE  06/30/2020  . OPHTHALMOLOGY EXAM  08/22/2020  . HEMOGLOBIN A1C  01/04/2021  . FOOT EXAM  04/03/2021  . Fecal DNA (Cologuard)  05/14/2023  . TETANUS/TDAP  12/10/2024  . PNEUMOCOCCAL POLYSACCHARIDE VACCINE AGE 38-64 HIGH RISK  Completed  . COVID-19 Vaccine  Completed  . Hepatitis C Screening  Completed  . HIV Screening  Completed      ----------------------------------------------------------------------------------------------------------------------------------------------------------------------------------------------------------------- Physical Exam BP 124/75 (BP Location: Right Arm, Patient Position: Sitting, Cuff Size: Large)   Pulse (!) 101   Wt 215 lb (97.5 kg)   SpO2 99%   BMI 35.82 kg/m   Physical Exam Constitutional:      Appearance: Normal appearance.  HENT:     Head: Normocephalic and atraumatic.  Eyes:     General: No scleral icterus. Cardiovascular:     Rate and Rhythm: Normal rate.  Pulmonary:     Effort: Pulmonary effort is normal.  Musculoskeletal:     Cervical back: Neck supple.  Neurological:     General: No focal deficit present.     Mental Status: He is alert.  Psychiatric:        Mood and Affect: Mood normal.        Behavior: Behavior normal.     ------------------------------------------------------------------------------------------------------------------------------------------------------------------------------------------------------------------- Assessment and Plan  Type 2 diabetes mellitus with unspecified complications (HCC) Blood sugar control has improved significantly since last visit.  Congratulated on improvement and encouraged continued exercise and weight loss.  Titrate trulicity to 3mg /week.  Continue additional medications at current dose.   Hypertension associated with diabetes (HCC) Blood pressure is at goal at for age and co-morbidities.  I recommend continuation of current medications.  In addition they were instructed to follow a low sodium diet with regular exercise to help to maintain adequate control of blood pressure.    Hyperlipidemia associated with type 2 diabetes mellitus (HCC) Tolerating atorvastatin well, continue.   Morbid obesity (HCC) Will restart phentermine as this has worked well for him previously.    Meds ordered this  encounter  Medications  . phentermine (ADIPEX-P) 37.5 MG tablet    Sig: One tab by mouth qAM    Dispense:  30 tablet    Refill:  1  . Dulaglutide (TRULICITY) 3 MG/0.5ML SOPN    Sig: Inject 0.5 mLs (3 mg total) as directed once a week.    Dispense:  6 mL    Refill:  1    Return in about 3 months (around 10/04/2020) for HTN/DM.    This visit occurred during the SARS-CoV-2 public health emergency.  Safety protocols were in place, including screening questions prior to the visit, additional usage of staff PPE, and extensive cleaning of exam room while observing appropriate contact time as indicated for disinfecting solutions.

## 2020-07-15 ENCOUNTER — Encounter: Payer: Self-pay | Admitting: Family Medicine

## 2020-07-25 DIAGNOSIS — N401 Enlarged prostate with lower urinary tract symptoms: Secondary | ICD-10-CM | POA: Diagnosis not present

## 2020-07-25 DIAGNOSIS — R351 Nocturia: Secondary | ICD-10-CM | POA: Diagnosis not present

## 2020-07-25 DIAGNOSIS — R972 Elevated prostate specific antigen [PSA]: Secondary | ICD-10-CM | POA: Diagnosis not present

## 2020-07-25 DIAGNOSIS — N5201 Erectile dysfunction due to arterial insufficiency: Secondary | ICD-10-CM | POA: Diagnosis not present

## 2020-08-13 ENCOUNTER — Encounter: Payer: Self-pay | Admitting: Family Medicine

## 2020-08-29 ENCOUNTER — Other Ambulatory Visit: Payer: Self-pay | Admitting: Family Medicine

## 2020-10-03 ENCOUNTER — Other Ambulatory Visit: Payer: Self-pay | Admitting: Family Medicine

## 2020-10-07 ENCOUNTER — Ambulatory Visit: Payer: BC Managed Care – PPO | Admitting: Family Medicine

## 2020-10-14 ENCOUNTER — Ambulatory Visit (INDEPENDENT_AMBULATORY_CARE_PROVIDER_SITE_OTHER): Payer: BC Managed Care – PPO | Admitting: Family Medicine

## 2020-10-14 ENCOUNTER — Encounter: Payer: Self-pay | Admitting: Family Medicine

## 2020-10-14 VITALS — BP 128/71 | HR 121 | Wt 205.0 lb

## 2020-10-14 DIAGNOSIS — E118 Type 2 diabetes mellitus with unspecified complications: Secondary | ICD-10-CM

## 2020-10-14 LAB — GLUCOSE, POCT (MANUAL RESULT ENTRY): POC Glucose: 190 mg/dl — AB (ref 70–99)

## 2020-10-14 NOTE — Patient Instructions (Signed)
Please discontinue [place].  If nausea returns let me know.  See me again in about 6 weeks.

## 2020-10-14 NOTE — Assessment & Plan Note (Signed)
Will discontinue tresiba due to hypoglycemia.  Discussed that restarting trulicity may be causing some of his GI symptoms, this is improved today.  Encouraged continued weight loss.  Continue home glucose monitoring.  Return in about 6 weeks (around 11/25/2020) for T2DM.

## 2020-10-14 NOTE — Progress Notes (Signed)
Jordan Taylor - 54 y.o. male MRN [id number]  Date of birth: 1966-01-15  Subjective Chief Complaint  Patient presents with  . Diabetes    HPI [name] is a 54 y.o. male here today to discuss diabetes.  He had been off of trulicity, tresiba and farxiga for the past 3 months due to change in insurance.  He recently restarted these medications and has had several episodes of hypoglycemia.  He denies recent changes to his diet.  Blood sugars into the low 50's at times.  He has bene working on weight loss and weight is down about 20lbs over the past year.  He did also have some nausea and diarrhea over the past couple of days since restarting medications.  This is better today.     ROS:  A comprehensive ROS was completed and negative except as noted per HPI  Allergies  Allergen Reactions  . Penicillins Swelling    Past Medical History:  Diagnosis Date  . Essential hypertension 12/04/2013  . Type 2 diabetes mellitus (HCC) 12/04/2013    Past Surgical History:  Procedure Laterality Date  . ANTERIOR CERVICAL DECOMPRESSION/DISCECTOMY FUSION 4 LEVELS N/A 09/07/2016   Procedure: Anterior Cervical Diskectomy Fusion - Cervical three- Cervical four - Cervical four - Cervical five - Cervical five- Cervical six  - Cervical six - Cervical seven;  Surgeon: Julio Sicks, MD;  Location: [facility];  Service: Neurosurgery;  Laterality: N/A;  . KNEE ARTHROSCOPY Left   . spinal cord surgery     20+ yrs ago    Social History   Socioeconomic History  . Marital status: Legally Separated    Spouse name: [name]  . Number of children: 5  . Years of education: College  . Highest education level: Not on file  Occupational History  . Occupation: Clinical cytogeneticist: Artist  Tobacco Use  . Smoking status: Former Games developer  . Smokeless tobacco: Never Used  Substance and Sexual Activity  . Alcohol use: No    Alcohol/week: 0.0 standard drinks  . Drug use: No  . Sexual activity:  Yes    Partners: Female  Other Topics Concern  . Not on file  Social History Narrative   Lives at home w/ his wife   Right-handed   Drinks about 10 cans of soda per day      Social Determinants of Health   Financial Resource Strain:   . Difficulty of Paying Living Expenses: Not on file  Food Insecurity:   . Worried About Programme researcher, broadcasting/film/video in the Last Year: Not on file  . Ran Out of Food in the Last Year: Not on file  Transportation Needs:   . Lack of Transportation (Medical): Not on file  . Lack of Transportation (Non-Medical): Not on file  Physical Activity:   . Days of Exercise per Week: Not on file  . Minutes of Exercise per Session: Not on file  Stress:   . Feeling of Stress : Not on file  Social Connections:   . Frequency of Communication with Friends and Family: Not on file  . Frequency of Social Gatherings with Friends and Family: Not on file  . Attends Religious Services: Not on file  . Active Member of Clubs or Organizations: Not on file  . Attends Banker Meetings: Not on file  . Marital Status: Not on file    Family History  Problem Relation Age of Onset  . Alcohol abuse Mother   .  Cancer Mother   . Alcohol abuse Father   . Diabetes Father   . Hypertension Father   . Cancer Other     Health Maintenance  Topic Date Due  . INFLUENZA VACCINE  06/30/2020  . OPHTHALMOLOGY EXAM  08/22/2020  . HEMOGLOBIN A1C  01/04/2021  . FOOT EXAM  04/03/2021  . Fecal DNA (Cologuard)  05/14/2023  . TETANUS/TDAP  12/10/2024  . PNEUMOCOCCAL POLYSACCHARIDE VACCINE AGE 31-64 HIGH RISK  Completed  . COVID-19 Vaccine  Completed  . Hepatitis C Screening  Completed  . HIV Screening  Completed     ----------------------------------------------------------------------------------------------------------------------------------------------------------------------------------------------------------------- Physical Exam BP 128/71 (BP Location: Left Arm, Patient  Position: Sitting, Cuff Size: Large)   Pulse (!) 121   Wt 205 lb (93 kg)   SpO2 100%   BMI 34.15 kg/m   Physical Exam Constitutional:      Appearance: Normal appearance.  HENT:     Head: Normocephalic and atraumatic.  Cardiovascular:     Rate and Rhythm: Normal rate and regular rhythm.  Pulmonary:     Effort: Pulmonary effort is normal.     Breath sounds: Normal breath sounds.  Musculoskeletal:     Cervical back: Neck supple.  Neurological:     General: No focal deficit present.     Mental Status: He is alert.  Psychiatric:        Mood and Affect: Mood normal.        Behavior: Behavior normal.     ------------------------------------------------------------------------------------------------------------------------------------------------------------------------------------------------------------------- Assessment and Plan  Type 2 diabetes mellitus with unspecified complications (HCC) Will discontinue tresiba due to hypoglycemia.  Discussed that restarting trulicity may be causing some of his GI symptoms, this is improved today.  Encouraged continued weight loss.  Continue home glucose monitoring.  Return in about 6 weeks (around 11/25/2020) for T2DM.    Orders Placed This Encounter  Procedures  . POCT Glucose (CBG)     No orders of the defined types were placed in this encounter.   Return in about 6 weeks (around 11/25/2020) for T2DM.    This visit occurred during the SARS-CoV-2 public health emergency.  Safety protocols were in place, including screening questions prior to the visit, additional usage of staff PPE, and extensive cleaning of exam room while observing appropriate contact time as indicated for disinfecting solutions.

## 2020-10-30 ENCOUNTER — Other Ambulatory Visit: Payer: Self-pay | Admitting: Sports Medicine

## 2020-11-28 ENCOUNTER — Telehealth (INDEPENDENT_AMBULATORY_CARE_PROVIDER_SITE_OTHER): Payer: BC Managed Care – PPO | Admitting: Family Medicine

## 2020-11-28 ENCOUNTER — Encounter: Payer: Self-pay | Admitting: Family Medicine

## 2020-11-28 DIAGNOSIS — I10 Essential (primary) hypertension: Secondary | ICD-10-CM | POA: Diagnosis not present

## 2020-11-28 DIAGNOSIS — J069 Acute upper respiratory infection, unspecified: Secondary | ICD-10-CM

## 2020-11-28 DIAGNOSIS — E1169 Type 2 diabetes mellitus with other specified complication: Secondary | ICD-10-CM

## 2020-11-28 DIAGNOSIS — E785 Hyperlipidemia, unspecified: Secondary | ICD-10-CM

## 2020-11-28 DIAGNOSIS — E118 Type 2 diabetes mellitus with unspecified complications: Secondary | ICD-10-CM | POA: Diagnosis not present

## 2020-11-28 MED ORDER — BENAZEPRIL HCL 20 MG PO TABS
ORAL_TABLET | ORAL | 2 refills | Status: DC
Start: 2020-11-28 — End: 2021-07-08

## 2020-11-28 MED ORDER — TRULICITY 3 MG/0.5ML ~~LOC~~ SOAJ
3.0000 mg | SUBCUTANEOUS | 1 refills | Status: DC
Start: 2020-11-28 — End: 2021-08-19

## 2020-11-28 MED ORDER — DAPAGLIFLOZIN PROPANEDIOL 10 MG PO TABS
ORAL_TABLET | ORAL | 1 refills | Status: DC
Start: 2020-11-28 — End: 2021-07-07

## 2020-11-28 MED ORDER — ATORVASTATIN CALCIUM 40 MG PO TABS
40.0000 mg | ORAL_TABLET | Freq: Every day | ORAL | 2 refills | Status: DC
Start: 2020-11-28 — End: 2021-07-08

## 2020-11-28 MED ORDER — AMLODIPINE BESYLATE 10 MG PO TABS: 10.0000 mg | ORAL_TABLET | Freq: Every day | ORAL | 2 refills | Status: DC

## 2020-11-28 NOTE — Assessment & Plan Note (Addendum)
Tolerating atorvastatin well, continue.  Update lipid panel at next f/u.

## 2020-11-28 NOTE — Progress Notes (Signed)
Pt did not have in-office visit due to current cold sx.

## 2020-11-28 NOTE — Assessment & Plan Note (Addendum)
Hypoglycemia improved since stopping [place].  Encouraged continued weight loss.  He will continue with trulicity, metformin and farxiga.  Continue home glucose monitoring  F/u in 6-8 weeks for repeat a1c.

## 2020-11-28 NOTE — Assessment & Plan Note (Signed)
Mild URI symptoms. Recommend supportive and symptomatic care.  Call if having worsening symptoms.

## 2020-11-28 NOTE — Progress Notes (Signed)
Jordan Taylor - 54 y.o. male MRN [id number]  Date of birth: Nov 12, 1966   This visit type was conducted due to national recommendations for restrictions regarding the COVID-19 Pandemic (e.g. social distancing).  This format is felt to be most appropriate for this patient at this time.  All issues noted in this document were discussed and addressed.  No physical exam was performed (except for noted visual exam findings with Video Visits).  I discussed the limitations of evaluation and management by telemedicine and the availability of in person appointments. The patient expressed understanding and agreed to proceed.  I connected with@ on 11/28/20 at  8:30 AM EST by a video enabled telemedicine application and verified that I am speaking with the correct person using two identifiers.  Present at visit: Everrett Coombe, DO Wilder Glade   Patient Location: Home [address] [place] [address]   Provider location:   [facility]  Chief Complaint  Patient presents with  . Diabetes    HPI  Jordan Taylor is a 54 y.o. male who presents via audio/video conferencing for a telehealth visit today.  He is following up today for diabetes.  At his last visit he had complaints of hypoglycemia.  I had him discontinue his Evaristo Bury and he reports that these issues have resolved.  Blood sugars have remained good with readings around 90-120.  Side effects related to Trulicity have resolved.    He is having some cold symptoms including mild chest congestion and stuffy nose.  He denies fever, chills, aches, nausea, vomiting, diarrhea.  He has had COVID vaccines and booster.      ROS:  A comprehensive ROS was completed and negative except as noted per HPI  Past Medical History:  Diagnosis Date  . Essential hypertension 12/04/2013  . Type 2 diabetes mellitus (HCC) 12/04/2013    Past Surgical History:  Procedure Laterality Date  . ANTERIOR CERVICAL DECOMPRESSION/DISCECTOMY FUSION 4 LEVELS N/A  09/07/2016   Procedure: Anterior Cervical Diskectomy Fusion - Cervical three- Cervical four - Cervical four - Cervical five - Cervical five- Cervical six  - Cervical six - Cervical seven;  Surgeon: Julio Sicks, MD;  Location: [facility] OR;  Service: Neurosurgery;  Laterality: N/A;  . KNEE ARTHROSCOPY Left   . spinal cord surgery     20+ yrs ago    Family History  Problem Relation Age of Onset  . Alcohol abuse Mother   . Cancer Mother   . Alcohol abuse Father   . Diabetes Father   . Hypertension Father   . Cancer Other     Social History   Socioeconomic History  . Marital status: Legally Separated    Spouse name: [name]  . Number of children: 5  . Years of education: College  . Highest education level: Not on file  Occupational History  . Occupation: Clinical cytogeneticist: Artist  Tobacco Use  . Smoking status: Former Games developer  . Smokeless tobacco: Never Used  Substance and Sexual Activity  . Alcohol use: No    Alcohol/week: 0.0 standard drinks  . Drug use: No  . Sexual activity: Yes    Partners: Female  Other Topics Concern  . Not on file  Social History Narrative   Lives at home w/ his wife   Right-handed   Drinks about 10 cans of soda per day      Social Determinants of Health   Financial Resource Strain: Not on file  Food Insecurity:  Not on file  Transportation Needs: Not on file  Physical Activity: Not on file  Stress: Not on file  Social Connections: Not on file  Intimate Partner Violence: Not on file     Current Outpatient Medications:  .  Insulin Pen Needle (ADVOCATE INSULIN PEN NEEDLES) 33G X 4 MM MISC, 1 each by Does not apply route daily. Use with insulin pen daily, Disp: 100 each, Rfl: 12 .  metFORMIN (GLUCOPHAGE) 1000 MG tablet, TAKE ONE TABLET BY MOUTH TWICE A DAY, Disp: 180 tablet, Rfl: 3 .  phentermine (ADIPEX-P) 37.5 MG tablet, TAKE ONE TABLET BY MOUTH EVERY MORNING, Disp: 30 tablet, Rfl: 1 .  tadalafil (CIALIS) 5 MG tablet,  Take 1 tablet (5 mg total) by mouth daily., Disp: 30 tablet, Rfl: 8 .  tamsulosin (FLOMAX) 0.4 MG CAPS capsule, Take 1 capsule (0.4 mg total) by mouth daily after breakfast., Disp: 90 capsule, Rfl: 3 .  amLODipine (NORVASC) 10 MG tablet, Take 1 tablet (10 mg total) by mouth daily., Disp: 90 tablet, Rfl: 2 .  atorvastatin (LIPITOR) 40 MG tablet, Take 1 tablet (40 mg total) by mouth daily., Disp: 90 tablet, Rfl: 2 .  benazepril (LOTENSIN) 20 MG tablet, TAKE ONE TABLET BY MOUTH DAILY, Disp: 90 tablet, Rfl: 2 .  dapagliflozin propanediol (FARXIGA) 10 MG TABS tablet, TAKE 10 MG BY MOUTH DAILY., Disp: 90 tablet, Rfl: 1 .  Dulaglutide (TRULICITY) 3 MG/0.5ML SOPN, Inject 3 mg as directed once a week., Disp: 6 mL, Rfl: 1  EXAM:  VITALS per patient if applicable: BP 128/71   Pulse 89   Wt 205 lb (93 kg)   BMI 34.15 kg/m   GENERAL: alert, oriented, appears well and in no acute distress  HEENT: atraumatic, conjunttiva clear, no obvious abnormalities on inspection of external nose and ears  NECK: normal movements of the head and neck  LUNGS: on inspection no signs of respiratory distress, breathing rate appears normal, no obvious gross SOB, gasping or wheezing  CV: no obvious cyanosis  MS: moves all visible extremities without noticeable abnormality  PSYCH/NEURO: pleasant and cooperative, no obvious depression or anxiety, speech and thought processing grossly intact  ASSESSMENT AND PLAN:  Discussed the following assessment and plan:  Type 2 diabetes mellitus with unspecified complications (HCC) Hypoglycemia improved since stopping [place].  Encouraged continued weight loss.  He will continue with trulicity, metformin and farxiga.  Continue home glucose monitoring  F/u in 6-8 weeks for repeat a1c.     URI (upper respiratory infection) Mild URI symptoms. Recommend supportive and symptomatic care.  Call if having worsening symptoms.   Hyperlipidemia associated with type 2 diabetes  mellitus (HCC) Tolerating atorvastatin well, continue.  Update lipid panel at next f/u.    Meds ordered this encounter  Medications  . amLODipine (NORVASC) 10 MG tablet    Sig: Take 1 tablet (10 mg total) by mouth daily.    Dispense:  90 tablet    Refill:  2    .  . atorvastatin (LIPITOR) 40 MG tablet    Sig: Take 1 tablet (40 mg total) by mouth daily.    Dispense:  90 tablet    Refill:  2  . benazepril (LOTENSIN) 20 MG tablet    Sig: TAKE ONE TABLET BY MOUTH DAILY    Dispense:  90 tablet    Refill:  2  . dapagliflozin propanediol (FARXIGA) 10 MG TABS tablet    Sig: TAKE 10 MG BY MOUTH DAILY.    Dispense:  90  tablet    Refill:  1  . Dulaglutide (TRULICITY) 3 MG/0.5ML SOPN    Sig: Inject 3 mg as directed once a week.    Dispense:  6 mL    Refill:  1       I discussed the assessment and treatment plan with the patient. The patient was provided an opportunity to ask questions and all were answered. The patient agreed with the plan and demonstrated an understanding of the instructions.   The patient was advised to call back or seek an in-person evaluation if the symptoms worsen or if the condition fails to improve as anticipated.    Everrett Coombe, DO

## 2020-12-03 ENCOUNTER — Other Ambulatory Visit: Payer: Self-pay

## 2020-12-03 ENCOUNTER — Other Ambulatory Visit: Payer: Self-pay | Admitting: Family Medicine

## 2020-12-03 ENCOUNTER — Encounter: Payer: Self-pay | Admitting: Family Medicine

## 2020-12-03 MED ORDER — PHENTERMINE HCL 37.5 MG PO TABS
37.5000 mg | ORAL_TABLET | Freq: Every morning | ORAL | 1 refills | Status: DC
Start: 2020-12-03 — End: 2020-12-05

## 2020-12-05 ENCOUNTER — Other Ambulatory Visit: Payer: Self-pay | Admitting: Family Medicine

## 2020-12-05 ENCOUNTER — Other Ambulatory Visit: Payer: Self-pay

## 2020-12-05 MED ORDER — PHENTERMINE HCL 37.5 MG PO TABS: 37.5000 mg | ORAL_TABLET | Freq: Every morning | ORAL | 1 refills | Status: DC

## 2021-01-02 ENCOUNTER — Other Ambulatory Visit: Payer: Self-pay | Admitting: Family Medicine

## 2021-01-02 DIAGNOSIS — I10 Essential (primary) hypertension: Secondary | ICD-10-CM

## 2021-01-31 ENCOUNTER — Other Ambulatory Visit: Payer: Self-pay | Admitting: Family Medicine

## 2021-02-06 ENCOUNTER — Other Ambulatory Visit: Payer: Self-pay

## 2021-02-06 ENCOUNTER — Encounter: Payer: Self-pay | Admitting: Family Medicine

## 2021-02-06 ENCOUNTER — Ambulatory Visit (INDEPENDENT_AMBULATORY_CARE_PROVIDER_SITE_OTHER): Payer: BC Managed Care – PPO | Admitting: Family Medicine

## 2021-02-06 VITALS — BP 126/71 | HR 109 | Wt 208.8 lb

## 2021-02-06 DIAGNOSIS — E785 Hyperlipidemia, unspecified: Secondary | ICD-10-CM

## 2021-02-06 DIAGNOSIS — E1159 Type 2 diabetes mellitus with other circulatory complications: Secondary | ICD-10-CM | POA: Diagnosis not present

## 2021-02-06 DIAGNOSIS — E1169 Type 2 diabetes mellitus with other specified complication: Secondary | ICD-10-CM | POA: Diagnosis not present

## 2021-02-06 DIAGNOSIS — E118 Type 2 diabetes mellitus with unspecified complications: Secondary | ICD-10-CM | POA: Diagnosis not present

## 2021-02-06 DIAGNOSIS — N401 Enlarged prostate with lower urinary tract symptoms: Secondary | ICD-10-CM | POA: Diagnosis not present

## 2021-02-06 DIAGNOSIS — I152 Hypertension secondary to endocrine disorders: Secondary | ICD-10-CM

## 2021-02-06 DIAGNOSIS — R338 Other retention of urine: Secondary | ICD-10-CM

## 2021-02-06 DIAGNOSIS — M4802 Spinal stenosis, cervical region: Secondary | ICD-10-CM

## 2021-02-06 MED ORDER — GABAPENTIN 300 MG PO CAPS: 300.0000 mg | ORAL_CAPSULE | Freq: Three times a day (TID) | ORAL | 3 refills | Status: DC

## 2021-02-06 MED ORDER — PHENTERMINE HCL 37.5 MG PO TABS: 37.5000 mg | ORAL_TABLET | Freq: Every morning | ORAL | 1 refills | Status: DC

## 2021-02-06 MED ORDER — ADVOCATE INSULIN PEN NEEDLES 33G X 4 MM MISC: 1.0000 | Freq: Every day | 12 refills | Status: DC

## 2021-02-06 NOTE — Assessment & Plan Note (Signed)
Update psa.  Symptoms stable with cialis and flomax, continue.

## 2021-02-06 NOTE — Progress Notes (Signed)
Jordan Taylor - 55 y.o. male MRN [id number]  Date of birth: 1966/05/17  Subjective Chief Complaint  Patient presents with  . Numbness    HPI Jordan Taylor is a 55 y.o. male with history of HTN, T2DM, HLD, and BPH here today for follow up.   He is also having some tingling down the R arm and into the 4th and 5th digits.    HTN currently managed with benazepril and amlodipine.  He is doing well with this and and denies side effects related to medication.  He has not had chest pain, shortness of breath,palpitations, headache.    Diabetes is treated with farxiga, trulcity, and metformin.  Tolerating medication well.  Has been able to lose some weight with trulicity and phentermine.  Blood sugars at home around 100-110.    He is doing well with atorvastatin and denies myalgias.   BPH symptoms managed with tadalafil and tamsulosin.  ROS:  A comprehensive ROS was completed and negative except as noted per HPI    Lipids  Allergies  Allergen Reactions  . Penicillins Swelling    Past Medical History:  Diagnosis Date  . Essential hypertension 12/04/2013  . Type 2 diabetes mellitus (HCC) 12/04/2013    Past Surgical History:  Procedure Laterality Date  . ANTERIOR CERVICAL DECOMPRESSION/DISCECTOMY FUSION 4 LEVELS N/A 09/07/2016   Procedure: Anterior Cervical Diskectomy Fusion - Cervical three- Cervical four - Cervical four - Cervical five - Cervical five- Cervical six  - Cervical six - Cervical seven;  Surgeon: Julio Sicks, MD;  Location: [facility] OR;  Service: Neurosurgery;  Laterality: N/A;  . KNEE ARTHROSCOPY Left   . spinal cord surgery     20+ yrs ago    Social History   Socioeconomic History  . Marital status: Legally Separated    Spouse name: [name]  . Number of children: 5  . Years of education: College  . Highest education level: Not on file  Occupational History  . Occupation: Clinical cytogeneticist: Artist  Tobacco Use  . Smoking status:  Former Games developer  . Smokeless tobacco: Never Used  Substance and Sexual Activity  . Alcohol use: No    Alcohol/week: 0.0 standard drinks  . Drug use: No  . Sexual activity: Yes    Partners: Female  Other Topics Concern  . Not on file  Social History Narrative   Lives at home w/ his wife   Right-handed   Drinks about 10 cans of soda per day      Social Determinants of Health   Financial Resource Strain: Not on file  Food Insecurity: Not on file  Transportation Needs: Not on file  Physical Activity: Not on file  Stress: Not on file  Social Connections: Not on file    Family History  Problem Relation Age of Onset  . Alcohol abuse Mother   . Cancer Mother   . Alcohol abuse Father   . Diabetes Father   . Hypertension Father   . Cancer Other     Health Maintenance  Topic Date Due  . INFLUENZA VACCINE  06/30/2020  . OPHTHALMOLOGY EXAM  08/22/2020  . COVID-19 Vaccine (3 - Booster for Moderna series) 10/01/2020  . HEMOGLOBIN A1C  01/04/2021  . FOOT EXAM  04/03/2021  . Fecal DNA (Cologuard)  05/14/2023  . TETANUS/TDAP  12/10/2024  . PNEUMOCOCCAL POLYSACCHARIDE VACCINE AGE 30-64 HIGH RISK  Completed  . Hepatitis C Screening  Completed  . HIV Screening  Completed  .  HPV VACCINES  Aged Out     ----------------------------------------------------------------------------------------------------------------------------------------------------------------------------------------------------------------- Physical Exam BP 126/71 (BP Location: Left Arm, Patient Position: Sitting, Cuff Size: Normal)   Pulse (!) 109   Wt 208 lb 12.8 oz (94.7 kg)   SpO2 100%   BMI 34.79 kg/m   Physical Exam Constitutional:      Appearance: He is well-developed.  Eyes:     General: No scleral icterus. Cardiovascular:     Rate and Rhythm: Normal rate and regular rhythm.  Pulmonary:     Effort: Pulmonary effort is normal.     Breath sounds: Normal breath sounds.  Musculoskeletal:      Cervical back: Neck supple.  Neurological:     General: No focal deficit present.     Mental Status: He is alert.  Psychiatric:        Mood and Affect: Mood normal.        Behavior: Behavior normal.     ------------------------------------------------------------------------------------------------------------------------------------------------------------------------------------------------------------------- Assessment and Plan  Hypertension associated with diabetes (HCC) Blood pressure is at goal at for age and co-morbidities.  I recommend continuation of benazepril and amlodipine.  In addition they were instructed to follow a low sodium diet with regular exercise to help to maintain adequate control of blood pressure.    Hyperlipidemia associated with type 2 diabetes mellitus (HCC) Doing well with atorvastatin. Update lipid panel.   Type 2 diabetes mellitus with unspecified complications (HCC) He is doing better with controlling sugars and weight with current medications.  Will continue current medications and update labs/a1c.   Cervical stenosis of spinal canal He seems to be having some radicular symptoms down the R arm.  Will add on gabapentin.  He doesn't want to have updated imaging at this time.  BPH (benign prostatic hyperplasia) Update psa.  Symptoms stable with cialis and flomax, continue.     Meds ordered this encounter  Medications  . phentermine (ADIPEX-P) 37.5 MG tablet    Sig: Take 1 tablet (37.5 mg total) by mouth every morning.    Dispense:  30 tablet    Refill:  1  . Insulin Pen Needle (ADVOCATE INSULIN PEN NEEDLES) 33G X 4 MM MISC    Sig: 1 each by Does not apply route daily. Use with insulin pen daily    Dispense:  100 each    Refill:  12  . gabapentin (NEURONTIN) 300 MG capsule    Sig: Take 1 capsule (300 mg total) by mouth 3 (three) times daily. 300mg  PO qhs x1 week then may increase to tid    Dispense:  90 capsule    Refill:  3    Return in  about 6 months (around 08/09/2021) for HTN/T2DM.    This visit occurred during the SARS-CoV-2 public health emergency.  Safety protocols were in place, including screening questions prior to the visit, additional usage of staff PPE, and extensive cleaning of exam room while observing appropriate contact time as indicated for disinfecting solutions.

## 2021-02-06 NOTE — Assessment & Plan Note (Signed)
He is doing better with controlling sugars and weight with current medications.  Will continue current medications and update labs/a1c.

## 2021-02-06 NOTE — Assessment & Plan Note (Signed)
Doing well with atorvastatin.  Update lipid panel.  ?

## 2021-02-06 NOTE — Assessment & Plan Note (Signed)
He seems to be having some radicular symptoms down the R arm.  Will add on gabapentin.  He doesn't want to have updated imaging at this time.

## 2021-02-06 NOTE — Patient Instructions (Signed)
Have labs completed.  Let's try adding gabapentin for nerve pain.  See me again in 6 months.

## 2021-02-06 NOTE — Assessment & Plan Note (Signed)
Blood pressure is at goal at for age and co-morbidities.  I recommend continuation of benazepril and amlodipine.  In addition they were instructed to follow a low sodium diet with regular exercise to help to maintain adequate control of blood pressure.   

## 2021-02-07 ENCOUNTER — Telehealth: Payer: Self-pay

## 2021-02-07 ENCOUNTER — Other Ambulatory Visit: Payer: Self-pay

## 2021-02-10 NOTE — Telephone Encounter (Signed)
Erroneous entry

## 2021-02-18 ENCOUNTER — Encounter: Payer: Self-pay | Admitting: Family Medicine

## 2021-02-20 DIAGNOSIS — N401 Enlarged prostate with lower urinary tract symptoms: Secondary | ICD-10-CM | POA: Diagnosis not present

## 2021-02-20 DIAGNOSIS — I152 Hypertension secondary to endocrine disorders: Secondary | ICD-10-CM | POA: Diagnosis not present

## 2021-02-20 DIAGNOSIS — E1159 Type 2 diabetes mellitus with other circulatory complications: Secondary | ICD-10-CM | POA: Diagnosis not present

## 2021-02-20 DIAGNOSIS — E118 Type 2 diabetes mellitus with unspecified complications: Secondary | ICD-10-CM | POA: Diagnosis not present

## 2021-02-20 DIAGNOSIS — R338 Other retention of urine: Secondary | ICD-10-CM | POA: Diagnosis not present

## 2021-02-21 ENCOUNTER — Other Ambulatory Visit: Payer: Self-pay | Admitting: Family Medicine

## 2021-02-21 ENCOUNTER — Other Ambulatory Visit: Payer: Self-pay

## 2021-02-21 ENCOUNTER — Encounter: Payer: Self-pay | Admitting: Family Medicine

## 2021-02-21 DIAGNOSIS — N289 Disorder of kidney and ureter, unspecified: Secondary | ICD-10-CM

## 2021-02-21 DIAGNOSIS — R7989 Other specified abnormal findings of blood chemistry: Secondary | ICD-10-CM

## 2021-02-21 DIAGNOSIS — R748 Abnormal levels of other serum enzymes: Secondary | ICD-10-CM

## 2021-02-21 LAB — COMPLETE METABOLIC PANEL WITH GFR
AG Ratio: 1.5 (calc) (ref 1.0–2.5)
ALT: 44 U/L (ref 9–46)
AST: 23 U/L (ref 10–35)
Albumin: 4.1 g/dL (ref 3.6–5.1)
Alkaline phosphatase (APISO): 174 U/L — ABNORMAL HIGH (ref 35–144)
BUN/Creatinine Ratio: 19 (calc) (ref 6–22)
BUN: 34 mg/dL — ABNORMAL HIGH (ref 7–25)
CO2: 19 mmol/L — ABNORMAL LOW (ref 20–32)
Calcium: 8.2 mg/dL — ABNORMAL LOW (ref 8.6–10.3)
Chloride: 108 mmol/L (ref 98–110)
Creat: 1.79 mg/dL — ABNORMAL HIGH (ref 0.70–1.33)
GFR, Est African American: 48 mL/min/{1.73_m2} — ABNORMAL LOW (ref >=60)
GFR, Est Non African American: 41 mL/min/{1.73_m2} — ABNORMAL LOW (ref >=60)
Globulin: 2.8 g/dL (calc) (ref 1.9–3.7)
Glucose, Bld: 146 mg/dL — ABNORMAL HIGH (ref 65–99)
Potassium: 5.2 mmol/L (ref 3.5–5.3)
Sodium: 136 mmol/L (ref 135–146)
Total Bilirubin: 0.4 mg/dL (ref 0.2–1.2)
Total Protein: 6.9 g/dL (ref 6.1–8.1)

## 2021-02-21 LAB — LIPID PANEL W/REFLEX DIRECT LDL
Cholesterol: 162 mg/dL (ref <200)
HDL: 34 mg/dL — ABNORMAL LOW (ref >=40)
LDL Cholesterol (Calc): 102 mg/dL (calc) — ABNORMAL HIGH
Non-HDL Cholesterol (Calc): 128 mg/dL (calc) (ref <130)
Total CHOL/HDL Ratio: 4.8 (calc) (ref <5.0)
Triglycerides: 162 mg/dL — ABNORMAL HIGH (ref <150)

## 2021-02-21 LAB — CBC
HCT: 39.7 % (ref 38.5–50.0)
Hemoglobin: 13.4 g/dL (ref 13.2–17.1)
MCH: 29.4 pg (ref 27.0–33.0)
MCHC: 33.8 g/dL (ref 32.0–36.0)
MCV: 87.1 fL (ref 80.0–100.0)
MPV: 9.9 fL (ref 7.5–12.5)
Platelets: 323 10*3/uL (ref 140–400)
RBC: 4.56 10*6/uL (ref 4.20–5.80)
RDW: 13.8 % (ref 11.0–15.0)
WBC: 9.4 10*3/uL (ref 3.8–10.8)

## 2021-02-21 LAB — HEMOGLOBIN A1C
Hgb A1c MFr Bld: 7.4 % of total Hgb — ABNORMAL HIGH (ref <5.7)
Mean Plasma Glucose: 166 mg/dL
eAG (mmol/L): 9.2 mmol/L

## 2021-02-21 LAB — PSA: PSA: 5.52 ng/mL — ABNORMAL HIGH (ref <=4.0)

## 2021-02-21 NOTE — Telephone Encounter (Signed)
1 week 

## 2021-02-24 LAB — HM DIABETES EYE EXAM

## 2021-03-03 DIAGNOSIS — R7989 Other specified abnormal findings of blood chemistry: Secondary | ICD-10-CM | POA: Diagnosis not present

## 2021-03-03 DIAGNOSIS — R748 Abnormal levels of other serum enzymes: Secondary | ICD-10-CM | POA: Diagnosis not present

## 2021-03-04 ENCOUNTER — Telehealth (INDEPENDENT_AMBULATORY_CARE_PROVIDER_SITE_OTHER): Payer: BC Managed Care – PPO | Admitting: Family Medicine

## 2021-03-04 ENCOUNTER — Telehealth: Payer: Self-pay

## 2021-03-04 ENCOUNTER — Encounter: Payer: Self-pay | Admitting: Family Medicine

## 2021-03-04 ENCOUNTER — Other Ambulatory Visit: Payer: Self-pay | Admitting: Family Medicine

## 2021-03-04 DIAGNOSIS — E875 Hyperkalemia: Secondary | ICD-10-CM

## 2021-03-04 DIAGNOSIS — R7989 Other specified abnormal findings of blood chemistry: Secondary | ICD-10-CM

## 2021-03-04 LAB — BASIC METABOLIC PANEL
BUN/Creatinine Ratio: 19 (calc) (ref 6–22)
BUN: 30 mg/dL — ABNORMAL HIGH (ref 7–25)
CO2: 22 mmol/L (ref 20–32)
Calcium: 9.5 mg/dL (ref 8.6–10.3)
Chloride: 107 mmol/L (ref 98–110)
Creat: 1.59 mg/dL — ABNORMAL HIGH (ref 0.70–1.33)
Glucose, Bld: 179 mg/dL — ABNORMAL HIGH (ref 65–99)
Potassium: 5.9 mmol/L — ABNORMAL HIGH (ref 3.5–5.3)
Sodium: 140 mmol/L (ref 135–146)

## 2021-03-04 LAB — HEPATIC FUNCTION PANEL
AG Ratio: 1.4 (calc) (ref 1.0–2.5)
ALT: 72 U/L — ABNORMAL HIGH (ref 9–46)
AST: 36 U/L — ABNORMAL HIGH (ref 10–35)
Albumin: 4.1 g/dL (ref 3.6–5.1)
Alkaline phosphatase (APISO): 246 U/L — ABNORMAL HIGH (ref 35–144)
Bilirubin, Direct: 0.1 mg/dL (ref 0.0–0.2)
Globulin: 3 g/dL (calc) (ref 1.9–3.7)
Indirect Bilirubin: 0.3 mg/dL (calc) (ref 0.2–1.2)
Total Bilirubin: 0.4 mg/dL (ref 0.2–1.2)
Total Protein: 7.1 g/dL (ref 6.1–8.1)

## 2021-03-04 LAB — PARATHYROID HORMONE, INTACT (NO CA): PTH: 22 pg/mL (ref 16–77)

## 2021-03-04 LAB — CALCIUM, IONIZED: Calcium, Ion: 5.24 mg/dL (ref 4.8–5.6)

## 2021-03-04 LAB — GAMMA GT: GGT: 353 U/L — ABNORMAL HIGH (ref 3–85)

## 2021-03-04 NOTE — Assessment & Plan Note (Signed)
RUQ ordered as he had increased alkaline phosphatase levels.

## 2021-03-04 NOTE — Assessment & Plan Note (Signed)
Repeat BMP, instructed to have this completed ASAP.  He is out of town currently but will have this completed tomorrow.

## 2021-03-04 NOTE — Addendum Note (Signed)
Addended by: Mammie Lorenzo on: 03/04/2021 10:05 AM   Modules accepted: Orders

## 2021-03-04 NOTE — Progress Notes (Signed)
Jordan Taylor - 55 y.o. male MRN [id number]  Date of birth: 1966-07-05   This visit type was conducted due to national recommendations for restrictions regarding the COVID-19 Pandemic (e.g. social distancing).  This format is felt to be most appropriate for this patient at this time.  All issues noted in this document were discussed and addressed.  No physical exam was performed (except for noted visual exam findings with Video Visits).  I discussed the limitations of evaluation and management by telemedicine and the availability of in person appointments. The patient expressed understanding and agreed to proceed.  I connected with@ on 03/04/21 at  1:30 PM EDT by a video enabled telemedicine application and verified that I am speaking with the correct person using two identifiers.  Present at visit: Everrett Coombe, DO Wilder Glade   Patient Location: Home [address] [place] [address]   Provider location:   [facility]  Chief Complaint  Patient presents with  . Results    HPI  Jordan Taylor is a 55 y.o. male who presents via audio/video conferencing for a telehealth visit today.  He would like to discuss recent lab results.   Potassium returned elevated at 5.9.  He does have CKD but renal function has remained stable, actually improved some.  He has not had any previous issues with elevated potassium.    LFT"s also remain elevated.  Alkaline phosphatase is more elevated than it has been previously.  Elevated GGT as well. Had abdominal US in 2013 showing fatty liver changes.  He denies RUQ pain.    ROS:  A comprehensive ROS was completed and negative except as noted per HPI  Past Medical History:  Diagnosis Date  . Essential hypertension 12/04/2013  . Type 2 diabetes mellitus (HCC) 12/04/2013    Past Surgical History:  Procedure Laterality Date  . ANTERIOR CERVICAL DECOMPRESSION/DISCECTOMY FUSION 4 LEVELS N/A 09/07/2016   Procedure: Anterior Cervical  Diskectomy Fusion - Cervical three- Cervical four - Cervical four - Cervical five - Cervical five- Cervical six  - Cervical six - Cervical seven;  Surgeon: Julio Sicks, MD;  Location: [facility] OR;  Service: Neurosurgery;  Laterality: N/A;  . KNEE ARTHROSCOPY Left   . spinal cord surgery     20+ yrs ago    Family History  Problem Relation Age of Onset  . Alcohol abuse Mother   . Cancer Mother   . Alcohol abuse Father   . Diabetes Father   . Hypertension Father   . Cancer Other     Social History   Socioeconomic History  . Marital status: Legally Separated    Spouse name: [name]  . Number of children: 5  . Years of education: College  . Highest education level: Not on file  Occupational History  . Occupation: Clinical cytogeneticist: Artist  Tobacco Use  . Smoking status: Former Games developer  . Smokeless tobacco: Never Used  Substance and Sexual Activity  . Alcohol use: No    Alcohol/week: 0.0 standard drinks  . Drug use: No  . Sexual activity: Yes    Partners: Female  Other Topics Concern  . Not on file  Social History Narrative   Lives at home w/ his wife   Right-handed   Drinks about 10 cans of soda per day      Social Determinants of Health   Financial Resource Strain: Not on file  Food Insecurity: Not on file  Transportation Needs: Not on  file  Physical Activity: Not on file  Stress: Not on file  Social Connections: Not on file  Intimate Partner Violence: Not on file     Current Outpatient Medications:  .  amLODipine (NORVASC) 10 MG tablet, Take 1 tablet (10 mg total) by mouth daily., Disp: 90 tablet, Rfl: 2 .  atorvastatin (LIPITOR) 40 MG tablet, Take 1 tablet (40 mg total) by mouth daily., Disp: 90 tablet, Rfl: 2 .  benazepril (LOTENSIN) 20 MG tablet, TAKE ONE TABLET BY MOUTH DAILY, Disp: 90 tablet, Rfl: 2 .  dapagliflozin propanediol (FARXIGA) 10 MG TABS tablet, TAKE 10 MG BY MOUTH DAILY., Disp: 90 tablet, Rfl: 1 .  Dulaglutide (TRULICITY)  3 MG/0.5ML SOPN, Inject 3 mg as directed once a week., Disp: 6 mL, Rfl: 1 .  gabapentin (NEURONTIN) 300 MG capsule, Take 1 capsule (300 mg total) by mouth 3 (three) times daily. 300mg  PO qhs x1 week then may increase to tid, Disp: 90 capsule, Rfl: 3 .  Insulin Pen Needle (ADVOCATE INSULIN PEN NEEDLES) 33G X 4 MM MISC, 1 each by Does not apply route daily. Use with insulin pen daily, Disp: 100 each, Rfl: 12 .  metFORMIN (GLUCOPHAGE) 1000 MG tablet, TAKE ONE TABLET BY MOUTH TWICE A DAY, Disp: 180 tablet, Rfl: 3 .  phentermine (ADIPEX-P) 37.5 MG tablet, Take 1 tablet (37.5 mg total) by mouth every morning., Disp: 30 tablet, Rfl: 1 .  tadalafil (CIALIS) 5 MG tablet, Take 1 tablet (5 mg total) by mouth daily., Disp: 30 tablet, Rfl: 8 .  tamsulosin (FLOMAX) 0.4 MG CAPS capsule, Take 1 capsule (0.4 mg total) by mouth daily after breakfast., Disp: 90 capsule, Rfl: 3  EXAM:  VITALS per patient if applicable: Ht 5\' 5"  (1.651 m)   Wt 209 lb (94.8 kg)   BMI 34.78 kg/m   GENERAL: alert, oriented, appears well and in no acute distress  HEENT: atraumatic, conjunttiva clear, no obvious abnormalities on inspection of external nose and ears  NECK: normal movements of the head and neck  LUNGS: on inspection no signs of respiratory distress, breathing rate appears normal, no obvious gross SOB, gasping or wheezing  CV: no obvious cyanosis  MS: moves all visible extremities without noticeable abnormality  PSYCH/NEURO: pleasant and cooperative, no obvious depression or anxiety, speech and thought processing grossly intact  ASSESSMENT AND PLAN:  Discussed the following assessment and plan:  Hyperkalemia Repeat BMP, instructed to have this completed ASAP.  He is out of town currently but will have this completed tomorrow.   Elevated LFTs RUQ ordered as he had increased alkaline phosphatase levels.       I discussed the assessment and treatment plan with the patient. The patient was provided an  opportunity to ask questions and all were answered. The patient agreed with the plan and demonstrated an understanding of the instructions.   The patient was advised to call back or seek an in-person evaluation if the symptoms worsen or if the condition fails to improve as anticipated.    , DO

## 2021-03-04 NOTE — Addendum Note (Signed)
Addended by: Mammie Lorenzo on: 03/04/2021 09:55 AM   Modules accepted: Orders

## 2021-03-04 NOTE — Telephone Encounter (Signed)
LVM advising patient Dr. Anastasio Auerbach ordered repeat labs.

## 2021-03-05 ENCOUNTER — Other Ambulatory Visit (HOSPITAL_BASED_OUTPATIENT_CLINIC_OR_DEPARTMENT_OTHER)
Admission: RE | Admit: 2021-03-05 | Discharge: 2021-03-05 | Disposition: A | Discharge status: Home / Self Care | Payer: BC Managed Care – PPO | Source: Ambulatory Visit | Attending: Family Medicine | Admitting: Family Medicine

## 2021-03-05 ENCOUNTER — Other Ambulatory Visit: Payer: Self-pay

## 2021-03-05 ENCOUNTER — Encounter: Payer: Self-pay | Admitting: Family Medicine

## 2021-03-05 DIAGNOSIS — E875 Hyperkalemia: Secondary | ICD-10-CM | POA: Insufficient documentation

## 2021-03-05 LAB — BASIC METABOLIC PANEL
Anion gap: 8 (ref 5–15)
BUN: 29 mg/dL — ABNORMAL HIGH (ref 6–20)
CO2: 21 mmol/L — ABNORMAL LOW (ref 22–32)
Calcium: 8.9 mg/dL (ref 8.9–10.3)
Chloride: 109 mmol/L (ref 98–111)
Creatinine, Ser: 1.56 mg/dL — ABNORMAL HIGH (ref 0.61–1.24)
GFR, Estimated: 52 mL/min — ABNORMAL LOW (ref >60)
Glucose, Bld: 172 mg/dL — ABNORMAL HIGH (ref 70–99)
Potassium: 5.1 mmol/L (ref 3.5–5.1)
Sodium: 138 mmol/L (ref 135–145)

## 2021-03-06 ENCOUNTER — Other Ambulatory Visit: Payer: BC Managed Care – PPO

## 2021-03-10 ENCOUNTER — Ambulatory Visit (HOSPITAL_BASED_OUTPATIENT_CLINIC_OR_DEPARTMENT_OTHER)
Admission: RE | Admit: 2021-03-10 | Discharge: 2021-03-10 | Disposition: A | Discharge status: Home / Self Care | Payer: BC Managed Care – PPO | Source: Ambulatory Visit | Attending: Family Medicine | Admitting: Family Medicine

## 2021-03-10 ENCOUNTER — Other Ambulatory Visit: Payer: BC Managed Care – PPO

## 2021-03-10 ENCOUNTER — Other Ambulatory Visit: Payer: Self-pay

## 2021-03-10 DIAGNOSIS — R945 Abnormal results of liver function studies: Secondary | ICD-10-CM | POA: Diagnosis not present

## 2021-03-10 DIAGNOSIS — R7989 Other specified abnormal findings of blood chemistry: Secondary | ICD-10-CM | POA: Diagnosis not present

## 2021-03-17 ENCOUNTER — Encounter: Payer: Self-pay | Admitting: Family Medicine

## 2021-03-18 ENCOUNTER — Other Ambulatory Visit: Payer: Self-pay | Admitting: Family Medicine

## 2021-03-18 DIAGNOSIS — R748 Abnormal levels of other serum enzymes: Secondary | ICD-10-CM

## 2021-03-18 DIAGNOSIS — K824 Cholesterolosis of gallbladder: Secondary | ICD-10-CM

## 2021-04-17 ENCOUNTER — Other Ambulatory Visit: Payer: Self-pay

## 2021-04-17 ENCOUNTER — Ambulatory Visit (INDEPENDENT_AMBULATORY_CARE_PROVIDER_SITE_OTHER): Payer: BC Managed Care – PPO

## 2021-04-17 ENCOUNTER — Encounter: Payer: Self-pay | Admitting: Family Medicine

## 2021-04-17 ENCOUNTER — Ambulatory Visit (INDEPENDENT_AMBULATORY_CARE_PROVIDER_SITE_OTHER): Payer: BC Managed Care – PPO | Admitting: Family Medicine

## 2021-04-17 VITALS — BP 125/77 | HR 97 | Temp 97.8°F | Ht 65.0 in | Wt 201.9 lb

## 2021-04-17 DIAGNOSIS — M542 Cervicalgia: Secondary | ICD-10-CM

## 2021-04-17 DIAGNOSIS — M5412 Radiculopathy, cervical region: Secondary | ICD-10-CM

## 2021-04-17 MED ORDER — PREDNISONE 50 MG PO TABS: ORAL_TABLET | ORAL | 0 refills | Status: DC

## 2021-04-17 NOTE — Progress Notes (Signed)
Pt states noticed rt sided weakness x 1 week. Rt side was dominant side. Pain from neck to hand (intermittent).  3-4 days prior girlfriend noted slurred speech.  Mole on chest.

## 2021-04-17 NOTE — Patient Instructions (Signed)
Have xray completed.  Start baby aspirin daily.  Start prednisone 50mg  daily x5 days.

## 2021-04-20 DIAGNOSIS — M5412 Radiculopathy, cervical region: Secondary | ICD-10-CM | POA: Insufficient documentation

## 2021-04-20 NOTE — Assessment & Plan Note (Addendum)
His symptoms are more consistent with cervical radiculopathy.   Adding course of steroids. Xray of c-spine ordered.  No neuro deficits noted on exam. Low suspicion for TIA or stroke but discussed that if symptoms worsen or he develops additional neurological symptoms he should be seen in the ED.

## 2021-04-20 NOTE — Progress Notes (Signed)
Jordan Taylor - 55 y.o. male MRN [id number]  Date of birth: December 30, 1965  Subjective Chief Complaint  Patient presents with  . Extremity Weakness    HPI Jordan Taylor is a 55 y.o. male here today with complaint of R sided arm pain and weakness.  He has had this previously with increased symptoms over the past few days.  He has history of previous neck surgeries with some additional stenosis noted on previous imaging.  He does also report that his girlfriend told him that his speech may be a little slurred but he didn't notice this.  He has not had any facial droop, facial numbness/tingling, asymmetrical smile, vision changes, or lower extremity weakness.  ROS:  A comprehensive ROS was completed and negative except as noted per HPI    Allergies  Allergen Reactions  . Penicillins Swelling    Past Medical History:  Diagnosis Date  . Essential hypertension 12/04/2013  . Type 2 diabetes mellitus (HCC) 12/04/2013    Past Surgical History:  Procedure Laterality Date  . ANTERIOR CERVICAL DECOMPRESSION/DISCECTOMY FUSION 4 LEVELS N/A 09/07/2016   Procedure: Anterior Cervical Diskectomy Fusion - Cervical three- Cervical four - Cervical four - Cervical five - Cervical five- Cervical six  - Cervical six - Cervical seven;  Surgeon: Julio Sicks, MD;  Location: [facility];  Service: Neurosurgery;  Laterality: N/A;  . KNEE ARTHROSCOPY Left   . spinal cord surgery     20+ yrs ago    Social History   Socioeconomic History  . Marital status: Legally Separated    Spouse name: [name]  . Number of children: 5  . Years of education: College  . Highest education level: Not on file  Occupational History  . Occupation: Clinical cytogeneticist: Artist  Tobacco Use  . Smoking status: Former Games developer  . Smokeless tobacco: Never Used  Substance and Sexual Activity  . Alcohol use: No    Alcohol/week: 0.0 standard drinks  . Drug use: No  . Sexual activity: Yes    Partners: Female   Other Topics Concern  . Not on file  Social History Narrative   Lives at home w/ his wife   Right-handed   Drinks about 10 cans of soda per day      Social Determinants of Health   Financial Resource Strain: Not on file  Food Insecurity: Not on file  Transportation Needs: Not on file  Physical Activity: Not on file  Stress: Not on file  Social Connections: Not on file    Family History  Problem Relation Age of Onset  . Alcohol abuse Mother   . Cancer Mother   . Alcohol abuse Father   . Diabetes Father   . Hypertension Father   . Cancer Other     Health Maintenance  Topic Date Due  . OPHTHALMOLOGY EXAM  08/22/2020  . COVID-19 Vaccine (3 - Booster for Moderna series) 08/31/2020  . FOOT EXAM  04/03/2021  . INFLUENZA VACCINE  06/30/2021  . HEMOGLOBIN A1C  08/23/2021  . Fecal DNA (Cologuard)  05/14/2023  . TETANUS/TDAP  12/10/2024  . PNEUMOCOCCAL POLYSACCHARIDE VACCINE AGE 63-64 HIGH RISK  Completed  . Hepatitis C Screening  Completed  . HIV Screening  Completed  . HPV VACCINES  Aged Out     ----------------------------------------------------------------------------------------------------------------------------------------------------------------------------------------------------------------- Physical Exam BP 125/77 (BP Location: Left Arm, Patient Position: Sitting, Cuff Size: Normal)   Pulse 97   Temp 97.8 F (36.6 C)   Ht 5\' 5"  (1.651  m)   Wt 201 lb 14.4 oz (91.6 kg)   SpO2 100%   BMI 33.60 kg/m   Physical Exam Constitutional:      Appearance: Normal appearance.  HENT:     Head: Normocephalic and atraumatic.  Eyes:     General: No scleral icterus.    Extraocular Movements: Extraocular movements intact.     Pupils: Pupils are equal, round, and reactive to light.  Cardiovascular:     Rate and Rhythm: Normal rate and regular rhythm.  Pulmonary:     Effort: Pulmonary effort is normal.     Breath sounds: Normal breath sounds.  Musculoskeletal:      Cervical back: Neck supple.  Skin:    General: Skin is warm and dry.  Neurological:     General: No focal deficit present.     Mental Status: He is alert and oriented to person, place, and time.     Cranial Nerves: No cranial nerve deficit.     Sensory: No sensory deficit.     Motor: No weakness.     Coordination: Romberg sign negative. Coordination normal. Finger-Nose-Finger Test normal.     Gait: Gait and tandem walk normal.     Deep Tendon Reflexes: Reflexes normal.  Psychiatric:        Mood and Affect: Mood normal.        Behavior: Behavior normal.     ------------------------------------------------------------------------------------------------------------------------------------------------------------------------------------------------------------------- Assessment and Plan  Cervical radiculitis His symptoms are more consistent with cervical radiculopathy.   Adding course of steroids. Xray of c-spine ordered.  No neuro deficits noted on exam. Low suspicion for TIA or stroke but discussed that if symptoms worsen or he develops additional neurological symptoms he should be seen in the ED.     Meds ordered this encounter  Medications  . predniSONE (DELTASONE) 50 MG tablet    Sig: Take 5mg  PO daily x5 days.    Dispense:  5 tablet    Refill:  0    No follow-ups on file.    This visit occurred during the SARS-CoV-2 public health emergency.  Safety protocols were in place, including screening questions prior to the visit, additional usage of staff PPE, and extensive cleaning of exam room while observing appropriate contact time as indicated for disinfecting solutions.

## 2021-05-01 ENCOUNTER — Encounter: Payer: Self-pay | Admitting: Family Medicine

## 2021-05-06 ENCOUNTER — Other Ambulatory Visit: Payer: Self-pay | Admitting: Family Medicine

## 2021-05-06 DIAGNOSIS — R338 Other retention of urine: Secondary | ICD-10-CM

## 2021-05-06 DIAGNOSIS — N401 Enlarged prostate with lower urinary tract symptoms: Secondary | ICD-10-CM

## 2021-05-15 ENCOUNTER — Telehealth (INDEPENDENT_AMBULATORY_CARE_PROVIDER_SITE_OTHER): Payer: BC Managed Care – PPO | Admitting: Family Medicine

## 2021-05-15 ENCOUNTER — Encounter: Payer: Self-pay | Admitting: Family Medicine

## 2021-05-15 DIAGNOSIS — M5412 Radiculopathy, cervical region: Secondary | ICD-10-CM | POA: Diagnosis not present

## 2021-05-15 MED ORDER — PREGABALIN 75 MG PO CAPS: 75.0000 mg | ORAL_CAPSULE | Freq: Two times a day (BID) | ORAL | 3 refills | Status: DC

## 2021-05-15 NOTE — Assessment & Plan Note (Signed)
Gabapentin has not been helpful.  Will try lyrica to see if this is more effective for him.  If not improving we should repeat MRI for interventional planning and consider addition of PT.

## 2021-05-15 NOTE — Progress Notes (Signed)
Trying to find a medication for nerve pain that is non-narcotic.

## 2021-05-15 NOTE — Progress Notes (Signed)
Jordan Taylor - 55 y.o. male MRN [id number]  Date of birth: [birth year]   This visit type was conducted due to national recommendations for restrictions regarding the COVID-19 Pandemic (e.g. social distancing).  This format is felt to be most appropriate for this patient at this time.  All issues noted in this document were discussed and addressed.  No physical exam was performed (except for noted visual exam findings with Video Visits).  I discussed the limitations of evaluation and management by telemedicine and the availability of in person appointments. The patient expressed understanding and agreed to proceed.  I connected withNAME@ on 05/15/21 at  1:30 PM EDT by a video enabled telemedicine application and verified that I am speaking with the correct person using two identifiers.  Present at visit: Jordan Coombe, DO Wilder Glade   Patient Location: Home [address] [place] [address]   Provider location:   [facility]  Chief Complaint  Patient presents with   Medication Problem    HPI  Jordan Gallentine is a 55 y.o. male who presents via audio/video conferencing for a telehealth visit today.  He continues to have difficulty with management of neuropathy.  He has tried gabapentin without any relief. He has tried injections in his neck previously be these were not very effective.  He denies weakness in extremities.    ROS:  A comprehensive ROS was completed and negative except as noted per HPI  Past Medical History:  Diagnosis Date   Essential hypertension 12/04/2013   Type 2 diabetes mellitus (HCC) 12/04/2013    Past Surgical History:  Procedure Laterality Date   ANTERIOR CERVICAL DECOMPRESSION/DISCECTOMY FUSION 4 LEVELS N/A 09/07/2016   Procedure: Anterior Cervical Diskectomy Fusion - Cervical three- Cervical four - Cervical four - Cervical five - Cervical five- Cervical six  - Cervical six - Cervical seven;  Surgeon: Julio Sicks, MD;  Location: [facility] OR;  Service:  Neurosurgery;  Laterality: N/A;   KNEE ARTHROSCOPY Left    spinal cord surgery     20+ yrs ago    Family History  Problem Relation Age of Onset   Alcohol abuse Mother    Cancer Mother    Alcohol abuse Father    Diabetes Father    Hypertension Father    Cancer Other     Social History   Socioeconomic History   Marital status: Legally Separated    Spouse name: [name]   Number of children: 5   Years of education: College   Highest education level: Not on file  Occupational History   Occupation: Clinical cytogeneticist: Artist  Tobacco Use   Smoking status: Former    Pack years: 0.00   Smokeless tobacco: Never  Substance and Sexual Activity   Alcohol use: No    Alcohol/week: 0.0 standard drinks   Drug use: No   Sexual activity: Yes    Partners: Female  Other Topics Concern   Not on file  Social History Narrative   Lives at home w/ his wife   Right-handed   Drinks about 10 cans of soda per day      Social Determinants of Health   Financial Resource Strain: Not on file  Food Insecurity: Not on file  Transportation Needs: Not on file  Physical Activity: Not on file  Stress: Not on file  Social Connections: Not on file  Intimate Partner Violence: Not on file     Current Outpatient Medications:  amLODipine (NORVASC) 10 MG tablet, Take 1 tablet (10 mg total) by mouth daily., Disp: 90 tablet, Rfl: 2   atorvastatin (LIPITOR) 40 MG tablet, Take 1 tablet (40 mg total) by mouth daily., Disp: 90 tablet, Rfl: 2   benazepril (LOTENSIN) 20 MG tablet, TAKE ONE TABLET BY MOUTH DAILY, Disp: 90 tablet, Rfl: 2   dapagliflozin propanediol (FARXIGA) 10 MG TABS tablet, TAKE 10 MG BY MOUTH DAILY., Disp: 90 tablet, Rfl: 1   Dulaglutide (TRULICITY) 3 MG/0.5ML SOPN, Inject 3 mg as directed once a week., Disp: 6 mL, Rfl: 1   Insulin Pen Needle (ADVOCATE INSULIN PEN NEEDLES) 33G X 4 MM MISC, 1 each by Does not apply route daily. Use with insulin pen daily,  Disp: 100 each, Rfl: 12   metFORMIN (GLUCOPHAGE) 1000 MG tablet, TAKE ONE TABLET BY MOUTH TWICE A DAY, Disp: 180 tablet, Rfl: 3   phentermine (ADIPEX-P) 37.5 MG tablet, Take 1 tablet (37.5 mg total) by mouth every morning., Disp: 30 tablet, Rfl: 1   predniSONE (DELTASONE) 50 MG tablet, Take 5mg  PO daily x5 days., Disp: 5 tablet, Rfl: 0   pregabalin (LYRICA) 75 MG capsule, Take 1 capsule (75 mg total) by mouth 2 (two) times daily., Disp: 60 capsule, Rfl: 3   tadalafil (CIALIS) 5 MG tablet, Take 1 tablet (5 mg total) by mouth daily., Disp: 30 tablet, Rfl: 8   tamsulosin (FLOMAX) 0.4 MG CAPS capsule, TAKE ONE CAPSULE BY MOUTH DAILY AFTER BREAKFAST, Disp: 30 capsule, Rfl: 2  EXAM:  VITALS per patient if applicable: Temp 98.6 F (37 C)   Ht 5\' 5"  (1.651 m)   Wt 204 lb (92.5 kg)   BMI 33.95 kg/m   GENERAL: alert, oriented, appears well and in no acute distress  HEENT: atraumatic, conjunttiva clear, no obvious abnormalities on inspection of external nose and ears  NECK: normal movements of the head and neck  LUNGS: on inspection no signs of respiratory distress, breathing rate appears normal, no obvious gross SOB, gasping or wheezing  CV: no obvious cyanosis  MS: moves all visible extremities without noticeable abnormality  PSYCH/NEURO: pleasant and cooperative, no obvious depression or anxiety, speech and thought processing grossly intact  ASSESSMENT AND PLAN:  Discussed the following assessment and plan:  Cervical radiculitis Gabapentin has not been helpful.  Will try lyrica to see if this is more effective for him.  If not improving we should repeat MRI for interventional planning and consider addition of PT.         I discussed the assessment and treatment plan with the patient. The patient was provided an opportunity to ask questions and all were answered. The patient agreed with the plan and demonstrated an understanding of the instructions.   The patient was advised to  call back or seek an in-person evaluation if the symptoms worsen or if the condition fails to improve as anticipated.    , DO

## 2021-06-11 NOTE — Telephone Encounter (Signed)
Letter has been efaxed to [facility] on [address].

## 2021-06-27 ENCOUNTER — Encounter: Payer: Self-pay | Admitting: Family Medicine

## 2021-06-30 ENCOUNTER — Encounter: Payer: Self-pay | Admitting: Family Medicine

## 2021-06-30 NOTE — Telephone Encounter (Signed)
Agree with offering visit.

## 2021-07-03 ENCOUNTER — Encounter: Payer: Self-pay | Admitting: Osteopathic Medicine

## 2021-07-03 ENCOUNTER — Telehealth (INDEPENDENT_AMBULATORY_CARE_PROVIDER_SITE_OTHER): Payer: BC Managed Care – PPO | Admitting: Osteopathic Medicine

## 2021-07-03 VITALS — BP 140/90 | Wt 201.0 lb

## 2021-07-03 DIAGNOSIS — J069 Acute upper respiratory infection, unspecified: Secondary | ICD-10-CM | POA: Diagnosis not present

## 2021-07-03 MED ORDER — IPRATROPIUM BROMIDE 0.06 % NA SOLN: 2.0000 | Freq: Four times a day (QID) | NASAL | 1 refills | Status: DC

## 2021-07-03 MED ORDER — GUAIFENESIN-CODEINE 100-10 MG/5ML PO SYRP: 5.0000 mL | ORAL_SOLUTION | Freq: Four times a day (QID) | ORAL | 0 refills | Status: DC | PRN

## 2021-07-03 MED ORDER — ONDANSETRON 8 MG PO TBDP: 8.0000 mg | ORAL_TABLET | Freq: Three times a day (TID) | ORAL | 0 refills | Status: DC | PRN

## 2021-07-03 MED ORDER — PREDNISONE 20 MG PO TABS: 20.0000 mg | ORAL_TABLET | Freq: Two times a day (BID) | ORAL | 0 refills | Status: DC

## 2021-07-03 NOTE — Progress Notes (Signed)
Telemedicine Visit via  Video & Audio (App used: MyChart)  I connected with Jordan Taylor on 07/03/21 at 3:31 PM  by phone or  telemedicine application as noted above  I verified that I am speaking with or regarding  the correct patient using two identifiers.  Participants: Myself, Dr Sunnie Nielsen DO Patient: Jordan Taylor Patient proxy if applicable: none Other, if applicable: none  Patient is at home I am in office at [facility]    I discussed the limitations of evaluation and management  by telemedicine and the availability of in person appointments.  The participant(s) above expressed understanding and  agreed to proceed with this appointment via telemedicine.       History of Present Illness: Jordan Taylor is a 55 y.o. male who would like to discuss cough.   7 days coughing, TMax 99, (+)vomiting (maybe d/t Trulicity), night sweats, mucus coughing.  Reports negative self-COVID test, still working...  Staying in a hotel while out of town for work  Higher BP attributed to [employer]  Taking Coricidin, this seems to help temporarily       Observations/Objective: BP 140/90   Wt 201 lb (91.2 kg)   BMI 33.45 kg/m  BP Readings from Last 3 Encounters:  07/03/21 140/90  04/17/21 125/77  02/06/21 126/71   Exam: Normal Speech.    Lab and Radiology Results No results found for this or any previous visit (from the past 72 hour(s)). No results found.     Assessment and Plan: 55 y.o. male with The encounter diagnosis was Viral URI with cough. Ddx: influenza likelihood seems high, pt is on [place] Moffat and I am unable to arrange testing for him and he is outside the Tamiflu window anyway. Symptomatic treatment. Offered work note, he declined.    PDMP not reviewed this encounter. No orders of the defined types were placed in this encounter.  Meds ordered this encounter  Medications   guaiFENesin-codeine (ROBITUSSIN AC) 100-10 MG/5ML  syrup    Sig: Take 5-10 mLs by mouth 4 (four) times daily as needed for cough or congestion.    Dispense:  180 mL    Refill:  0   ipratropium (ATROVENT) 0.06 % nasal spray    Sig: Place 2 sprays into both nostrils 4 (four) times daily. As needed for runny nose / postnasal drip    Dispense:  15 mL    Refill:  1   ondansetron (ZOFRAN ODT) 8 MG disintegrating tablet    Sig: Take 1 tablet (8 mg total) by mouth every 8 (eight) hours as needed for nausea or vomiting.    Dispense:  20 tablet    Refill:  0   predniSONE (DELTASONE) 20 MG tablet    Sig: Take 1 tablet (20 mg total) by mouth 2 (two) times daily with a meal.    Dispense:  10 tablet    Refill:  0    Follow Up Instructions: Return if symptoms worsen or fail to improve - see [place] or UC/ER.    I discussed the assessment and treatment plan with the patient. The patient was provided an opportunity to ask questions and all were answered. The patient agreed with the plan and demonstrated an understanding of the instructions.   The patient was advised to call back or seek an in-person evaluation if any new concerns, if symptoms worsen or if the condition fails to improve as anticipated.  20 minutes of non-face-to-face time was provided during this encounter.      . . . . . . . . . . . . . Marland Kitchen  Historical information moved to improve visibility of documentation.  Past Medical History:  Diagnosis Date   Essential hypertension 12/04/2013   Type 2 diabetes mellitus (HCC) 12/04/2013   Past Surgical History:  Procedure Laterality Date   ANTERIOR CERVICAL DECOMPRESSION/DISCECTOMY FUSION 4 LEVELS N/A 09/07/2016   Procedure: Anterior Cervical Diskectomy Fusion - Cervical three- Cervical four - Cervical four - Cervical five - Cervical five- Cervical six  - Cervical six - Cervical seven;  Surgeon: Julio Sicks, MD;  Location: [facility] OR;  Service: Neurosurgery;  Laterality: N/A;   KNEE ARTHROSCOPY Left     spinal cord surgery     20+ yrs ago   Social History   Tobacco Use   Smoking status: Former   Smokeless tobacco: Never  Substance Use Topics   Alcohol use: No    Alcohol/week: 0.0 standard drinks   family history includes Alcohol abuse in his father and mother; Cancer in his mother and another family member; Diabetes in his father; Hypertension in his father.  Medications: Current Outpatient Medications  Medication Sig Dispense Refill   amLODipine (NORVASC) 10 MG tablet Take 1 tablet (10 mg total) by mouth daily. 90 tablet 2   atorvastatin (LIPITOR) 40 MG tablet Take 1 tablet (40 mg total) by mouth daily. 90 tablet 2   benazepril (LOTENSIN) 20 MG tablet TAKE ONE TABLET BY MOUTH DAILY 90 tablet 2   dapagliflozin propanediol (FARXIGA) 10 MG TABS tablet TAKE 10 MG BY MOUTH DAILY. 90 tablet 1   Dulaglutide (TRULICITY) 3 MG/0.5ML SOPN Inject 3 mg as directed once a week. 6 mL 1   guaiFENesin-codeine (ROBITUSSIN AC) 100-10 MG/5ML syrup Take 5-10 mLs by mouth 4 (four) times daily as needed for cough or congestion. 180 mL 0   Insulin Pen Needle (ADVOCATE INSULIN PEN NEEDLES) 33G X 4 MM MISC 1 each by Does not apply route daily. Use with insulin pen daily 100 each 12   ipratropium (ATROVENT) 0.06 % nasal spray Place 2 sprays into both nostrils 4 (four) times daily. As needed for runny nose / postnasal drip 15 mL 1   metFORMIN (GLUCOPHAGE) 1000 MG tablet TAKE ONE TABLET BY MOUTH TWICE A DAY 180 tablet 3   ondansetron (ZOFRAN ODT) 8 MG disintegrating tablet Take 1 tablet (8 mg total) by mouth every 8 (eight) hours as needed for nausea or vomiting. 20 tablet 0   phentermine (ADIPEX-P) 37.5 MG tablet Take 1 tablet (37.5 mg total) by mouth every morning. 30 tablet 1   predniSONE (DELTASONE) 20 MG tablet Take 1 tablet (20 mg total) by mouth 2 (two) times daily with a meal. 10 tablet 0   pregabalin (LYRICA) 75 MG capsule Take 1 capsule (75 mg total) by mouth 2 (two) times daily. 60 capsule 3    tadalafil (CIALIS) 5 MG tablet Take 1 tablet (5 mg total) by mouth daily. 30 tablet 8   tamsulosin (FLOMAX) 0.4 MG CAPS capsule TAKE ONE CAPSULE BY MOUTH DAILY AFTER BREAKFAST 30 capsule 2   No current facility-administered medications for this visit.   Allergies  Allergen Reactions   Penicillins Swelling     If phone visit, billing and coding can please add appropriate modifier if needed

## 2021-07-03 NOTE — Progress Notes (Signed)
Attempted to contact patient at 310 pm, no answer. Left a detailed vm msg with direct call back info.

## 2021-07-06 ENCOUNTER — Encounter: Payer: Self-pay | Admitting: Family Medicine

## 2021-07-07 ENCOUNTER — Other Ambulatory Visit: Payer: Self-pay

## 2021-07-07 DIAGNOSIS — E118 Type 2 diabetes mellitus with unspecified complications: Secondary | ICD-10-CM

## 2021-07-07 MED ORDER — DAPAGLIFLOZIN PROPANEDIOL 10 MG PO TABS: ORAL_TABLET | ORAL | 1 refills | Status: DC

## 2021-07-08 ENCOUNTER — Other Ambulatory Visit: Payer: Self-pay

## 2021-07-08 DIAGNOSIS — N401 Enlarged prostate with lower urinary tract symptoms: Secondary | ICD-10-CM

## 2021-07-08 DIAGNOSIS — I10 Essential (primary) hypertension: Secondary | ICD-10-CM

## 2021-07-08 MED ORDER — TAMSULOSIN HCL 0.4 MG PO CAPS: ORAL_CAPSULE | ORAL | 2 refills | Status: AC

## 2021-07-08 MED ORDER — ATORVASTATIN CALCIUM 40 MG PO TABS: 40.0000 mg | ORAL_TABLET | Freq: Every day | ORAL | 2 refills | Status: DC

## 2021-07-08 MED ORDER — METFORMIN HCL 1000 MG PO TABS: ORAL_TABLET | ORAL | 3 refills | Status: DC

## 2021-07-08 MED ORDER — BENAZEPRIL HCL 20 MG PO TABS
ORAL_TABLET | ORAL | 2 refills | Status: DC
Start: 2021-07-08 — End: 2021-09-29

## 2021-07-08 MED ORDER — PREGABALIN 75 MG PO CAPS: 75.0000 mg | ORAL_CAPSULE | Freq: Two times a day (BID) | ORAL | 3 refills | Status: DC

## 2021-07-08 MED ORDER — AMLODIPINE BESYLATE 10 MG PO TABS
10.0000 mg | ORAL_TABLET | Freq: Every day | ORAL | 2 refills | Status: DC
Start: 2021-07-08 — End: 2021-09-29

## 2021-07-14 ENCOUNTER — Telehealth: Payer: Self-pay

## 2021-07-14 NOTE — Telephone Encounter (Signed)
PA notice received for Farxiga 10 mg. Called [facility] and was told that a PA was not required because the pt had an open claim for metformin. Pt aware, pharmacy aware.

## 2021-07-21 ENCOUNTER — Other Ambulatory Visit: Payer: Self-pay

## 2021-07-21 DIAGNOSIS — E118 Type 2 diabetes mellitus with unspecified complications: Secondary | ICD-10-CM

## 2021-07-21 MED ORDER — DAPAGLIFLOZIN PROPANEDIOL 10 MG PO TABS: ORAL_TABLET | ORAL | 1 refills | Status: DC

## 2021-07-25 DIAGNOSIS — R351 Nocturia: Secondary | ICD-10-CM | POA: Diagnosis not present

## 2021-07-25 DIAGNOSIS — R972 Elevated prostate specific antigen [PSA]: Secondary | ICD-10-CM | POA: Diagnosis not present

## 2021-07-25 DIAGNOSIS — N486 Induration penis plastica: Secondary | ICD-10-CM | POA: Diagnosis not present

## 2021-07-25 DIAGNOSIS — N5201 Erectile dysfunction due to arterial insufficiency: Secondary | ICD-10-CM | POA: Diagnosis not present

## 2021-07-25 DIAGNOSIS — N401 Enlarged prostate with lower urinary tract symptoms: Secondary | ICD-10-CM | POA: Diagnosis not present

## 2021-07-29 DIAGNOSIS — E119 Type 2 diabetes mellitus without complications: Secondary | ICD-10-CM | POA: Diagnosis not present

## 2021-08-05 ENCOUNTER — Other Ambulatory Visit: Payer: Self-pay | Admitting: Family Medicine

## 2021-08-05 DIAGNOSIS — E118 Type 2 diabetes mellitus with unspecified complications: Secondary | ICD-10-CM

## 2021-08-06 ENCOUNTER — Encounter: Payer: Self-pay | Admitting: Family Medicine

## 2021-08-11 ENCOUNTER — Ambulatory Visit: Payer: BC Managed Care – PPO | Admitting: Family Medicine

## 2021-08-19 ENCOUNTER — Other Ambulatory Visit: Payer: Self-pay

## 2021-08-19 ENCOUNTER — Encounter: Payer: Self-pay | Admitting: Family Medicine

## 2021-08-19 ENCOUNTER — Ambulatory Visit (INDEPENDENT_AMBULATORY_CARE_PROVIDER_SITE_OTHER): Payer: BC Managed Care – PPO | Admitting: Family Medicine

## 2021-08-19 VITALS — BP 128/67 | HR 104 | Wt 204.0 lb

## 2021-08-19 DIAGNOSIS — Z23 Encounter for immunization: Secondary | ICD-10-CM | POA: Diagnosis not present

## 2021-08-19 DIAGNOSIS — E785 Hyperlipidemia, unspecified: Secondary | ICD-10-CM

## 2021-08-19 DIAGNOSIS — E1159 Type 2 diabetes mellitus with other circulatory complications: Secondary | ICD-10-CM | POA: Diagnosis not present

## 2021-08-19 DIAGNOSIS — E118 Type 2 diabetes mellitus with unspecified complications: Secondary | ICD-10-CM

## 2021-08-19 DIAGNOSIS — I152 Hypertension secondary to endocrine disorders: Secondary | ICD-10-CM

## 2021-08-19 DIAGNOSIS — E1169 Type 2 diabetes mellitus with other specified complication: Secondary | ICD-10-CM | POA: Diagnosis not present

## 2021-08-19 LAB — POCT GLYCOSYLATED HEMOGLOBIN (HGB A1C): HbA1c, POC (controlled diabetic range): 7.3 % — AB (ref 0.0–7.0)

## 2021-08-19 MED ORDER — PHENTERMINE HCL 37.5 MG PO TABS: 37.5000 mg | ORAL_TABLET | Freq: Every morning | ORAL | 2 refills | Status: DC

## 2021-08-19 NOTE — Assessment & Plan Note (Signed)
Blood pressure remains well controlled at this time.  Recommend continuation of current antihypertensive medications. ?

## 2021-08-19 NOTE — Assessment & Plan Note (Addendum)
Blood sugars are stable at this time.  He has discontinued Trulicity and will continue to work on dietary changes to improve blood sugars.  Continue metformin at current strength.  He will continue [place] as well.  He has no longer taking atorvastatin due to severe muscle aches.  He would like to continue to work on dietary changes to help with this.

## 2021-08-19 NOTE — Assessment & Plan Note (Signed)
He is doing well with phentermine in the past.  Renew this for a period of 3 months

## 2021-08-19 NOTE — Progress Notes (Signed)
Jordan Taylor - 55 y.o. male MRN [id number]  Date of birth: 07-08-1966  Subjective Chief Complaint  Patient presents with   Hypertension   Diabetes    HPI Jordan Taylor is a 55 year old male here today for follow-up visit.  He reports that overall he is doing well.    He has discontinued Trulicity due to continued nausea and diarrhea.  There has been some issues with affordability regarding [place] and [place].  He is working with [employer] to try and obtain this for free or discounted price.  He has been working on weight loss.  He would like to restart phentermine as he has had good success with this in the past.  He continues to do well with current blood pressure medications.  Tolerating well without side effects.  He denies chest pain, shortness of breath, palpitations, headache or vision changes.  ROS:  A comprehensive ROS was completed and negative except as noted per HPI  Allergies  Allergen Reactions   Atorvastatin Other (See Comments)    Severe muscle pain/Weakness   Penicillins Swelling    Past Medical History:  Diagnosis Date   Essential hypertension 12/04/2013   Type 2 diabetes mellitus (HCC) 12/04/2013    Past Surgical History:  Procedure Laterality Date   ANTERIOR CERVICAL DECOMPRESSION/DISCECTOMY FUSION 4 LEVELS N/A 09/07/2016   Procedure: Anterior Cervical Diskectomy Fusion - Cervical three- Cervical four - Cervical four - Cervical five - Cervical five- Cervical six  - Cervical six - Cervical seven;  Surgeon: Julio Sicks, MD;  Location: [facility] OR;  Service: Neurosurgery;  Laterality: N/A;   KNEE ARTHROSCOPY Left    spinal cord surgery     20+ yrs ago    Social History   Socioeconomic History   Marital status: Legally Separated    Spouse name: [name]   Number of children: 5   Years of education: College   Highest education level: Not on file  Occupational History   Occupation: Clinical cytogeneticist: Artist  Tobacco Use    Smoking status: Former   Smokeless tobacco: Never  Substance and Sexual Activity   Alcohol use: No    Alcohol/week: 0.0 standard drinks   Drug use: No   Sexual activity: Yes    Partners: Female  Other Topics Concern   Not on file  Social History Narrative   Lives at home w/ his wife   Right-handed   Drinks about 10 cans of soda per day      Social Determinants of Health   Financial Resource Strain: Not on file  Food Insecurity: Not on file  Transportation Needs: Not on file  Physical Activity: Not on file  Stress: Not on file  Social Connections: Not on file    Family History  Problem Relation Age of Onset   Alcohol abuse Mother    Cancer Mother    Alcohol abuse Father    Diabetes Father    Hypertension Father    Cancer Other     Health Maintenance  Topic Date Due   COVID-19 Vaccine (3 - Booster for Moderna series) 08/31/2020   FOOT EXAM  04/03/2021   Zoster Vaccines- Shingrix (1 of 2) 10/03/2021 (Originally 04/22/2014)   HEMOGLOBIN A1C  02/16/2022   OPHTHALMOLOGY EXAM  02/24/2022   Fecal DNA (Cologuard)  05/14/2023   TETANUS/TDAP  12/10/2024   INFLUENZA VACCINE  Completed   Hepatitis C Screening  Completed   HIV Screening  Completed   HPV VACCINES  Aged Out     ----------------------------------------------------------------------------------------------------------------------------------------------------------------------------------------------------------------- Physical Exam BP 128/67   Pulse (!) 104   Wt 204 lb (92.5 kg)   SpO2 100% Comment: on RA  BMI 33.95 kg/m   Physical Exam Constitutional:      Appearance: Normal appearance.  HENT:     Head: Normocephalic and atraumatic.  Eyes:     General: No scleral icterus. Cardiovascular:     Rate and Rhythm: Normal rate and regular rhythm.  Musculoskeletal:     Cervical back: Neck supple.  Neurological:     General: No focal deficit present.     Mental Status: He is alert.  Psychiatric:         Mood and Affect: Mood normal.        Behavior: Behavior normal.    ------------------------------------------------------------------------------------------------------------------------------------------------------------------------------------------------------------------- Assessment and Plan  Hypertension associated with diabetes (HCC) Blood pressure remains well controlled at this time.  Recommend continuation of current antihypertensive medications.  Hyperlipidemia associated with type 2 diabetes mellitus (HCC) Blood sugars are stable at this time.  He has discontinued Trulicity and will continue to work on dietary changes to improve blood sugars.  Continue metformin at current strength.  He will continue [place] as well.  He has no longer taking atorvastatin due to severe muscle aches.  He would like to continue to work on dietary changes to help with this.  Morbid obesity (HCC) He is doing well with phentermine in the past.  Renew this for a period of 3 months   Meds ordered this encounter  Medications   phentermine (ADIPEX-P) 37.5 MG tablet    Sig: Take 1 tablet (37.5 mg total) by mouth every morning.    Dispense:  30 tablet    Refill:  2    Return in about 4 months (around 12/19/2021) for HTN/DM.    This visit occurred during the SARS-CoV-2 public health emergency.  Safety protocols were in place, including screening questions prior to the visit, additional usage of staff PPE, and extensive cleaning of exam room while observing appropriate contact time as indicated for disinfecting solutions.

## 2021-09-28 ENCOUNTER — Other Ambulatory Visit: Payer: Self-pay | Admitting: Family Medicine

## 2021-09-28 DIAGNOSIS — I10 Essential (primary) hypertension: Secondary | ICD-10-CM

## 2021-10-20 ENCOUNTER — Encounter: Payer: Self-pay | Admitting: Family Medicine

## 2021-10-21 ENCOUNTER — Telehealth (INDEPENDENT_AMBULATORY_CARE_PROVIDER_SITE_OTHER): Payer: BC Managed Care – PPO | Admitting: Family Medicine

## 2021-10-21 ENCOUNTER — Encounter: Payer: Self-pay | Admitting: Family Medicine

## 2021-10-21 DIAGNOSIS — J329 Chronic sinusitis, unspecified: Secondary | ICD-10-CM | POA: Diagnosis not present

## 2021-10-21 DIAGNOSIS — J40 Bronchitis, not specified as acute or chronic: Secondary | ICD-10-CM

## 2021-10-21 MED ORDER — PREDNISONE 20 MG PO TABS: 40.0000 mg | ORAL_TABLET | Freq: Every day | ORAL | 0 refills | Status: AC

## 2021-10-21 MED ORDER — BENZONATATE 200 MG PO CAPS
200.0000 mg | ORAL_CAPSULE | Freq: Two times a day (BID) | ORAL | 0 refills | Status: DC | PRN
Start: 2021-10-21 — End: 2021-10-31

## 2021-10-21 MED ORDER — DOXYCYCLINE HYCLATE 100 MG PO TABS: 100.0000 mg | ORAL_TABLET | Freq: Two times a day (BID) | ORAL | 0 refills | Status: AC

## 2021-10-21 NOTE — Progress Notes (Signed)
Virtual Video Visit via MyChart Note  I connected with  Jordan Taylor on 10/21/21 at  2:20 PM EST by the video enabled telemedicine application for MyChart, and verified that I am speaking with the correct person using two identifiers.   I introduced myself as a Publishing rights manager with the practice. We discussed the limitations of evaluation and management by telemedicine and the availability of in person appointments. The patient expressed understanding and agreed to proceed.  Participating parties in this visit include: The patient and the nurse practitioner listed.  The patient is: At home I am: In the office - [name]  Subjective:    CC:  Chief Complaint  Patient presents with   Cough    HPI: Jordan Taylor is a 55 y.o. year old male presenting today via MyChart today for cough and URI.  Patient reports about 1.5 to 2 weeks ago he started showing upper respiratory symptoms after his son was sick with RSV.  Patient states he did start improving about a week ago however the last several days his symptoms returned and he has begun to feel worse.  He reports that he feels like he has a terrible cold that he " cannot shake."  He is reporting productive cough with green phlegm, low-grade fevers, sinus pressure, headaches, occasional shortness of breath and dyspnea on exertion, occasional wheezing mostly at night, fatigue.  He denies any chest pain, nausea, vomiting, diarrhea, body aches, lethargy, confusion.  So far he has tried TheraFlu, Mucinex, and Coricidin with minimal improvement.    Past medical history, Surgical history, Family history not pertinant except as noted below, Social history, Allergies, and medications have been entered into the medical record, reviewed, and corrections made.   Review of Systems:  All review of systems negative except what is listed in the HPI   Objective:    General:  Speaking clearly in complete sentences. Minimal  shortness of breath noted. Productive cough heard. Alert and oriented x3.   Normal judgment.  Absent acute distress.   Impression and Recommendations:    1. Sinobronchitis Duration and double sickening consistent with bacterial infection. Treating with doxycycline and adding prednisone given his occasional dyspnea and wheezing. Benzonatate added for cough - discussed pulmonary hygiene and significant of cough and deep breathing. Continue supportive measures including rest, hydration, humidifier use, steam showers, warm compresses, warm liquids with honey and lemon, and OTC cough/cold/analgesics. Patient aware of signs/symptoms requiring further/urgent evaluation.    - doxycycline (VIBRA-TABS) 100 MG tablet; Take 1 tablet (100 mg total) by mouth 2 (two) times daily for 7 days.  Dispense: 14 tablet; Refill: 0 - predniSONE (DELTASONE) 20 MG tablet; Take 2 tablets (40 mg total) by mouth daily with breakfast for 5 days.  Dispense: 10 tablet; Refill: 0 - benzonatate (TESSALON) 200 MG capsule; Take 1 capsule (200 mg total) by mouth 2 (two) times daily as needed for cough.  Dispense: 20 capsule; Refill: 0     Follow-up if symptoms worsen or fail to improve.    I discussed the assessment and treatment plan with the patient. The patient was provided an opportunity to ask questions and all were answered. The patient agreed with the plan and demonstrated an understanding of the instructions.   The patient was advised to call back or seek an in-person evaluation if the symptoms worsen or if the condition fails to improve as anticipated.  I spent 20 minutes dedicated to the care of this patient on the date of this  encounter to include pre-visit chart review of prior notes and results, face-to-face time with the patient, and post-visit ordering of testing as indicated.   Clayborne Dana, NP

## 2021-10-31 ENCOUNTER — Encounter: Payer: Self-pay | Admitting: Family Medicine

## 2021-10-31 ENCOUNTER — Other Ambulatory Visit: Payer: Self-pay

## 2021-10-31 ENCOUNTER — Ambulatory Visit (INDEPENDENT_AMBULATORY_CARE_PROVIDER_SITE_OTHER): Payer: BC Managed Care – PPO | Admitting: Family Medicine

## 2021-10-31 VITALS — BP 139/92 | HR 92 | Wt 212.0 lb

## 2021-10-31 DIAGNOSIS — R109 Unspecified abdominal pain: Secondary | ICD-10-CM | POA: Diagnosis not present

## 2021-10-31 DIAGNOSIS — R051 Acute cough: Secondary | ICD-10-CM | POA: Diagnosis not present

## 2021-10-31 MED ORDER — BENZONATATE 200 MG PO CAPS: 200.0000 mg | ORAL_CAPSULE | Freq: Two times a day (BID) | ORAL | 0 refills | Status: DC | PRN

## 2021-10-31 MED ORDER — HYDROCOD POLST-CPM POLST ER 10-8 MG/5ML PO SUER: 5.0000 mL | Freq: Two times a day (BID) | ORAL | 0 refills | Status: AC | PRN

## 2021-10-31 NOTE — Progress Notes (Signed)
Acute Office Visit  Subjective:    Patient ID: Jordan Taylor, male    DOB: 09-26-1966, 55 y.o.   MRN: [id number]  Chief Complaint  Patient presents with   Abdominal Pain     HPI Patient is in today for abdominal discomfort.   10/21/21 - Virtual Visit - Sinobronchitis, treated with prednisone, benzonatate and doxycycline  Today patient reports that overall he is doing much better. Most URI symptoms have resolved, but he continues to have a lingering, "annoying" cough that is mostly dry now. He reports that for the past week or so he has noticed some right mid-abdomen discomfort that occurs in an area about the size of a 50 cent piece and only occurs when coughing. He describes the pain as a tightness/sharp discomfort. If he pushes around on his abdomen he can find the tender spot. He has not yet taken anything for the discomfort. He denies any nausea, vomiting, diarrhea, distention/bulge, skin changes, fevers, URI symptoms, chest pain, shortness of breath, wheezing.     Past Medical History:  Diagnosis Date   Essential hypertension 12/04/2013   Type 2 diabetes mellitus (HCC) 12/04/2013    Past Surgical History:  Procedure Laterality Date   ANTERIOR CERVICAL DECOMPRESSION/DISCECTOMY FUSION 4 LEVELS N/A 09/07/2016   Procedure: Anterior Cervical Diskectomy Fusion - Cervical three- Cervical four - Cervical four - Cervical five - Cervical five- Cervical six  - Cervical six - Cervical seven;  Surgeon: Julio Sicks, MD;  Location: [facility] OR;  Service: Neurosurgery;  Laterality: N/A;   KNEE ARTHROSCOPY Left    spinal cord surgery     20+ yrs ago    Family History  Problem Relation Age of Onset   Alcohol abuse Mother    Cancer Mother    Alcohol abuse Father    Diabetes Father    Hypertension Father    Cancer Other     Social History   Socioeconomic History   Marital status: Legally Separated    Spouse name: [name]   Number of children: 5   Years of education: College   Highest  education level: Not on file  Occupational History   Occupation: Clinical cytogeneticist: Artist  Tobacco Use   Smoking status: Former   Smokeless tobacco: Never  Substance and Sexual Activity   Alcohol use: No    Alcohol/week: 0.0 standard drinks   Drug use: No   Sexual activity: Yes    Partners: Female  Other Topics Concern   Not on file  Social History Narrative   Lives at home w/ his wife   Right-handed   Drinks about 10 cans of soda per day      Social Determinants of Health   Financial Resource Strain: Not on file  Food Insecurity: Not on file  Transportation Needs: Not on file  Physical Activity: Not on file  Stress: Not on file  Social Connections: Not on file  Intimate Partner Violence: Not on file    Outpatient Medications Prior to Visit  Medication Sig Dispense Refill   amLODipine (NORVASC) 10 MG tablet TAKE ONE TABLET BY MOUTH DAILY 90 tablet 2   benazepril (LOTENSIN) 20 MG tablet TAKE ONE TABLET BY MOUTH DAILY 90 tablet 2   FARXIGA 10 MG TABS tablet TAKE ONE TABLET BY MOUTH DAILY 90 tablet 1   metFORMIN (GLUCOPHAGE) 1000 MG tablet TAKE ONE TABLET BY MOUTH TWICE A DAY 180 tablet 3   phentermine (ADIPEX-P) 37.5 MG tablet Take  1 tablet (37.5 mg total) by mouth every morning. 30 tablet 2   pregabalin (LYRICA) 75 MG capsule Take 1 capsule (75 mg total) by mouth 2 (two) times daily. 60 capsule 3   tamsulosin (FLOMAX) 0.4 MG CAPS capsule TAKE ONE CAPSULE BY MOUTH DAILY AFTER BREAKFAST 30 capsule 2   benzonatate (TESSALON) 200 MG capsule Take 1 capsule (200 mg total) by mouth 2 (two) times daily as needed for cough. 20 capsule 0   No facility-administered medications prior to visit.    Allergies  Allergen Reactions   Atorvastatin Other (See Comments)    Severe muscle pain/Weakness   Penicillins Swelling    Review of Systems All review of systems negative except what is listed in the HPI     Objective:    Physical Exam Vitals  reviewed.  Constitutional:      Appearance: Normal appearance.  HENT:     Head: Normocephalic and atraumatic.  Cardiovascular:     Rate and Rhythm: Normal rate and regular rhythm.     Pulses: Normal pulses.     Heart sounds: Normal heart sounds.  Pulmonary:     Effort: Pulmonary effort is normal.     Breath sounds: Normal breath sounds.  Abdominal:     General: Abdomen is flat. Bowel sounds are normal. There is no distension.     Palpations: Abdomen is soft. There is no mass.     Tenderness: There is abdominal tenderness. There is no right CVA tenderness, left CVA tenderness, guarding or rebound.     Hernia: No hernia is present.    Musculoskeletal:        General: Normal range of motion.     Cervical back: Normal range of motion and neck supple.  Skin:    General: Skin is warm and dry.     Findings: No bruising, erythema or rash.  Neurological:     General: No focal deficit present.     Mental Status: He is alert and oriented to person, place, and time. Mental status is at baseline.  Psychiatric:        Mood and Affect: Mood normal.        Behavior: Behavior normal.        Thought Content: Thought content normal.        Judgment: Judgment normal.    BP (!) 139/92   Pulse 92   Wt 212 lb (96.2 kg)   SpO2 97%   BMI 35.28 kg/m  Wt Readings from Last 3 Encounters:  10/31/21 212 lb (96.2 kg)  08/19/21 204 lb (92.5 kg)  07/03/21 201 lb (91.2 kg)    Health Maintenance Due  Topic Date Due   Zoster Vaccines- Shingrix (1 of 2) Never done   Pneumococcal Vaccine 64-48 Years old (2 - PCV) 09/24/2017   COVID-19 Vaccine (3 - Booster for Moderna series) 05/26/2020   FOOT EXAM  04/03/2021    There are no preventive care reminders to display for this patient.   Lab Results  Component Value Date   TSH 0.99 07/26/2019   Lab Results  Component Value Date   WBC 9.4 02/20/2021   HGB 13.4 02/20/2021   HCT 39.7 02/20/2021   MCV 87.1 02/20/2021   PLT 323 02/20/2021   Lab  Results  Component Value Date   NA 138 03/05/2021   K 5.1 03/05/2021   CO2 21 (L) 03/05/2021   GLUCOSE 172 (H) 03/05/2021   BUN 29 (H) 03/05/2021   CREATININE 1.56 (H)  03/05/2021   BILITOT 0.4 03/03/2021   ALKPHOS 109 01/28/2015   AST 36 (H) 03/03/2021   ALT 72 (H) 03/03/2021   PROT 7.1 03/03/2021   ALBUMIN 3.8 04/07/2017   CALCIUM 8.9 03/05/2021   ANIONGAP 8 03/05/2021   Lab Results  Component Value Date   CHOL 162 02/20/2021   Lab Results  Component Value Date   HDL 34 (L) 02/20/2021   Lab Results  Component Value Date   LDLCALC 102 (H) 02/20/2021   Lab Results  Component Value Date   TRIG 162 (H) 02/20/2021   Lab Results  Component Value Date   CHOLHDL 4.8 02/20/2021   Lab Results  Component Value Date   HGBA1C 7.3 (A) 08/19/2021       Assessment & Plan:   1. Acute cough Refilling tessalon perles for cough and adding some tussionex for bedtime relief. Continue warm liquids with honey/lemon and voice rest. - chlorpheniramine-HYDROcodone (TUSSIONEX) 10-8 MG/5ML SUER; Take 5 mLs by mouth every 12 (twelve) hours as needed for up to 5 days for cough (cough, will cause drowsiness.).  Dispense: 50 mL; Refill: 0 - benzonatate (TESSALON) 200 MG capsule; Take 1 capsule (200 mg total) by mouth 2 (two) times daily as needed for cough.  Dispense: 20 capsule; Refill: 0  2. Right sided abdominal pain Possibly a muscle strain from coughing, but would like to get labs to rule out other potential causes. No alarm findings on exam today. Can try some NSAIDs for inflammation. Brace the area with arms when coughing. Will update him with lab results and any changes to plan of care.  - CBC - Comprehensive metabolic panel - Amylase - Lipase   Patient aware of signs/symptoms requiring further/urgent evaluation.  Follow-up pending results or sooner if symptoms worsen or fail to improve.   Lollie Marrow Reola Calkins, DNP, FNP-C

## 2021-10-31 NOTE — Patient Instructions (Signed)
Labs today to rule out other causes.  Likely a muscle strain. Try taking an antiinflammatory like ibuprofen or Aleve. Scheduled once or twice a day for the next week or two as long as you do not have any adverse effects (stomach upset, bleeding, etc).  Try to brace the area with your hands when you feel yourself about to cough.  We will continue working on getting the lingering cough to clear, sometimes this can take a little while to fully resolve. Glad to hear most of your symptoms have significantly improved!

## 2021-11-01 LAB — COMPREHENSIVE METABOLIC PANEL
AG Ratio: 1.3 (calc) (ref 1.0–2.5)
ALT: 54 U/L — ABNORMAL HIGH (ref 9–46)
AST: 23 U/L (ref 10–35)
Albumin: 3.6 g/dL (ref 3.6–5.1)
Alkaline phosphatase (APISO): 249 U/L — ABNORMAL HIGH (ref 35–144)
BUN/Creatinine Ratio: 15 (calc) (ref 6–22)
BUN: 22 mg/dL (ref 7–25)
CO2: 20 mmol/L (ref 20–32)
Calcium: 8.6 mg/dL (ref 8.6–10.3)
Chloride: 106 mmol/L (ref 98–110)
Creat: 1.44 mg/dL — ABNORMAL HIGH (ref 0.70–1.30)
Globulin: 2.8 g/dL (calc) (ref 1.9–3.7)
Glucose, Bld: 336 mg/dL — ABNORMAL HIGH (ref 65–99)
Potassium: 4.7 mmol/L (ref 3.5–5.3)
Sodium: 136 mmol/L (ref 135–146)
Total Bilirubin: 0.6 mg/dL (ref 0.2–1.2)
Total Protein: 6.4 g/dL (ref 6.1–8.1)

## 2021-11-01 LAB — AMYLASE: Amylase: 70 U/L (ref 21–101)

## 2021-11-01 LAB — CBC
HCT: 33.9 % — ABNORMAL LOW (ref 38.5–50.0)
Hemoglobin: 11.2 g/dL — ABNORMAL LOW (ref 13.2–17.1)
MCH: 28.8 pg (ref 27.0–33.0)
MCHC: 33 g/dL (ref 32.0–36.0)
MCV: 87.1 fL (ref 80.0–100.0)
MPV: 10.7 fL (ref 7.5–12.5)
Platelets: 259 10*3/uL (ref 140–400)
RBC: 3.89 10*6/uL — ABNORMAL LOW (ref 4.20–5.80)
RDW: 13.1 % (ref 11.0–15.0)
WBC: 12.1 10*3/uL — ABNORMAL HIGH (ref 3.8–10.8)

## 2021-11-01 LAB — LIPASE: Lipase: 70 U/L — ABNORMAL HIGH (ref 7–60)

## 2021-11-03 ENCOUNTER — Encounter: Payer: Self-pay | Admitting: Family Medicine

## 2021-11-03 DIAGNOSIS — K824 Cholesterolosis of gallbladder: Secondary | ICD-10-CM

## 2021-11-03 DIAGNOSIS — R748 Abnormal levels of other serum enzymes: Secondary | ICD-10-CM

## 2021-11-10 ENCOUNTER — Telehealth: Payer: Self-pay

## 2021-11-10 ENCOUNTER — Other Ambulatory Visit: Payer: Self-pay

## 2021-11-10 ENCOUNTER — Encounter: Payer: Self-pay | Admitting: Family Medicine

## 2021-11-10 ENCOUNTER — Encounter (HOSPITAL_BASED_OUTPATIENT_CLINIC_OR_DEPARTMENT_OTHER): Payer: Self-pay

## 2021-11-10 ENCOUNTER — Emergency Department (HOSPITAL_BASED_OUTPATIENT_CLINIC_OR_DEPARTMENT_OTHER)
Admission: EM | Admit: 2021-11-10 | Discharge: 2021-11-11 | Discharge status: Against Medical Advice | Payer: BC Managed Care – PPO | Attending: Emergency Medicine | Admitting: Emergency Medicine

## 2021-11-10 ENCOUNTER — Ambulatory Visit (INDEPENDENT_AMBULATORY_CARE_PROVIDER_SITE_OTHER): Payer: BC Managed Care – PPO

## 2021-11-10 ENCOUNTER — Ambulatory Visit (INDEPENDENT_AMBULATORY_CARE_PROVIDER_SITE_OTHER): Payer: BC Managed Care – PPO | Admitting: Family Medicine

## 2021-11-10 ENCOUNTER — Emergency Department (HOSPITAL_BASED_OUTPATIENT_CLINIC_OR_DEPARTMENT_OTHER): Payer: BC Managed Care – PPO

## 2021-11-10 VITALS — BP 145/76 | HR 116 | Temp 97.9°F | Resp 21 | Ht 65.0 in | Wt 209.0 lb

## 2021-11-10 DIAGNOSIS — R5383 Other fatigue: Secondary | ICD-10-CM

## 2021-11-10 DIAGNOSIS — R0602 Shortness of breath: Secondary | ICD-10-CM | POA: Insufficient documentation

## 2021-11-10 DIAGNOSIS — Z87891 Personal history of nicotine dependence: Secondary | ICD-10-CM | POA: Insufficient documentation

## 2021-11-10 DIAGNOSIS — R059 Cough, unspecified: Secondary | ICD-10-CM

## 2021-11-10 DIAGNOSIS — R0902 Hypoxemia: Secondary | ICD-10-CM

## 2021-11-10 DIAGNOSIS — I3139 Other pericardial effusion (noninflammatory): Secondary | ICD-10-CM | POA: Diagnosis not present

## 2021-11-10 DIAGNOSIS — I10 Essential (primary) hypertension: Secondary | ICD-10-CM | POA: Diagnosis not present

## 2021-11-10 DIAGNOSIS — R0609 Other forms of dyspnea: Secondary | ICD-10-CM

## 2021-11-10 DIAGNOSIS — Z20822 Contact with and (suspected) exposure to covid-19: Secondary | ICD-10-CM | POA: Diagnosis not present

## 2021-11-10 DIAGNOSIS — E119 Type 2 diabetes mellitus without complications: Secondary | ICD-10-CM | POA: Diagnosis not present

## 2021-11-10 DIAGNOSIS — R000 Tachycardia, unspecified: Secondary | ICD-10-CM | POA: Insufficient documentation

## 2021-11-10 DIAGNOSIS — Z7984 Long term (current) use of oral hypoglycemic drugs: Secondary | ICD-10-CM | POA: Diagnosis not present

## 2021-11-10 DIAGNOSIS — Z79899 Other long term (current) drug therapy: Secondary | ICD-10-CM | POA: Insufficient documentation

## 2021-11-10 DIAGNOSIS — J90 Pleural effusion, not elsewhere classified: Secondary | ICD-10-CM | POA: Diagnosis not present

## 2021-11-10 DIAGNOSIS — J9811 Atelectasis: Secondary | ICD-10-CM | POA: Diagnosis not present

## 2021-11-10 DIAGNOSIS — I3131 Malignant pericardial effusion in diseases classified elsewhere: Secondary | ICD-10-CM | POA: Diagnosis not present

## 2021-11-10 LAB — CBC WITH DIFFERENTIAL/PLATELET
Abs Immature Granulocytes: 0.06 10*3/uL (ref 0.00–0.07)
Absolute Monocytes: 601 cells/uL (ref 200–950)
Basophils Absolute: 58 cells/uL (ref 0–200)
Basophils Absolute: 0.1 10*3/uL (ref 0.0–0.1)
Basophils Relative: 0.6 %
Basophils Relative: 1 %
Eosinophils Absolute: 301 cells/uL (ref 15–500)
Eosinophils Absolute: 0.3 10*3/uL (ref 0.0–0.5)
Eosinophils Relative: 3.1 %
Eosinophils Relative: 3 %
HCT: 31.7 % — ABNORMAL LOW (ref 38.5–50.0)
HCT: 30.5 % — ABNORMAL LOW (ref 39.0–52.0)
Hemoglobin: 10.4 g/dL — ABNORMAL LOW (ref 13.2–17.1)
Hemoglobin: 10.2 g/dL — ABNORMAL LOW (ref 13.0–17.0)
Immature Granulocytes: 1 %
Lymphocytes Relative: 14 %
Lymphs Abs: 1174 cells/uL (ref 850–3900)
Lymphs Abs: 1.3 10*3/uL (ref 0.7–4.0)
MCH: 29 pg (ref 27.0–33.0)
MCH: 28.6 pg (ref 26.0–34.0)
MCHC: 32.8 g/dL (ref 32.0–36.0)
MCHC: 33.4 g/dL (ref 30.0–36.0)
MCV: 88.3 fL (ref 80.0–100.0)
MCV: 85.4 fL (ref 80.0–100.0)
MPV: 10.2 fL (ref 7.5–12.5)
Monocytes Absolute: 0.7 10*3/uL (ref 0.1–1.0)
Monocytes Relative: 6.2 %
Monocytes Relative: 8 %
Neutro Abs: 7566 cells/uL (ref 1500–7800)
Neutro Abs: 7 10*3/uL (ref 1.7–7.7)
Neutrophils Relative %: 78 %
Neutrophils Relative %: 73 %
Platelets: 349 10*3/uL (ref 140–400)
Platelets: 359 10*3/uL (ref 150–400)
RBC: 3.59 10*6/uL — ABNORMAL LOW (ref 4.20–5.80)
RBC: 3.57 MIL/uL — ABNORMAL LOW (ref 4.22–5.81)
RDW: 12.9 % (ref 11.0–15.0)
RDW: 13.5 % (ref 11.5–15.5)
Total Lymphocyte: 12.1 %
WBC: 9.7 10*3/uL (ref 3.8–10.8)
WBC: 9.4 10*3/uL (ref 4.0–10.5)
nRBC: 0 % (ref 0.0–0.2)

## 2021-11-10 LAB — BASIC METABOLIC PANEL
Anion gap: 11 (ref 5–15)
BUN: 24 mg/dL — ABNORMAL HIGH (ref 6–20)
CO2: 19 mmol/L — ABNORMAL LOW (ref 22–32)
Calcium: 8.5 mg/dL — ABNORMAL LOW (ref 8.9–10.3)
Chloride: 103 mmol/L (ref 98–111)
Creatinine, Ser: 1.71 mg/dL — ABNORMAL HIGH (ref 0.61–1.24)
GFR, Estimated: 46 mL/min — ABNORMAL LOW (ref >60)
Glucose, Bld: 571 mg/dL (ref 70–99)
Potassium: 4.9 mmol/L (ref 3.5–5.1)
Sodium: 133 mmol/L — ABNORMAL LOW (ref 135–145)

## 2021-11-10 LAB — COMPLETE METABOLIC PANEL WITH GFR
AG Ratio: 1.2 (calc) (ref 1.0–2.5)
ALT: 39 U/L (ref 9–46)
AST: 14 U/L (ref 10–35)
Albumin: 3.5 g/dL — ABNORMAL LOW (ref 3.6–5.1)
Alkaline phosphatase (APISO): 204 U/L — ABNORMAL HIGH (ref 35–144)
BUN/Creatinine Ratio: 14 (calc) (ref 6–22)
BUN: 24 mg/dL (ref 7–25)
CO2: 20 mmol/L (ref 20–32)
Calcium: 8.7 mg/dL (ref 8.6–10.3)
Chloride: 104 mmol/L (ref 98–110)
Creat: 1.68 mg/dL — ABNORMAL HIGH (ref 0.70–1.30)
Globulin: 3 g/dL (calc) (ref 1.9–3.7)
Glucose, Bld: 485 mg/dL — ABNORMAL HIGH (ref 65–99)
Potassium: 5.6 mmol/L — ABNORMAL HIGH (ref 3.5–5.3)
Sodium: 133 mmol/L — ABNORMAL LOW (ref 135–146)
Total Bilirubin: 0.5 mg/dL (ref 0.2–1.2)
Total Protein: 6.5 g/dL (ref 6.1–8.1)
eGFR: 47 mL/min/{1.73_m2} — ABNORMAL LOW (ref >=60)

## 2021-11-10 LAB — TROPONIN I (HIGH SENSITIVITY)
Troponin I (High Sensitivity): 13 ng/L (ref <18)
Troponin I (High Sensitivity): 14 ng/L (ref <18)

## 2021-11-10 LAB — BRAIN NATRIURETIC PEPTIDE: Brain Natriuretic Peptide: 21 pg/mL (ref <100)

## 2021-11-10 LAB — D-DIMER, QUANTITATIVE: D-Dimer, Quant: 1.7 mcg/mL FEU — ABNORMAL HIGH (ref <0.50)

## 2021-11-10 MED ORDER — LACTATED RINGERS IV BOLUS
1000.0000 mL | Freq: Once | INTRAVENOUS | Status: AC
  Administered 2021-11-10: 1000 mL via INTRAVENOUS

## 2021-11-10 MED ORDER — AZITHROMYCIN 250 MG PO TABS: ORAL_TABLET | ORAL | 0 refills | Status: DC

## 2021-11-10 MED ORDER — ALBUTEROL SULFATE HFA 108 (90 BASE) MCG/ACT IN AERS: 2.0000 | INHALATION_SPRAY | Freq: Four times a day (QID) | RESPIRATORY_TRACT | 11 refills | Status: AC | PRN

## 2021-11-10 MED ORDER — FLOVENT DISKUS 50 MCG/ACT IN AEPB: 1.0000 | INHALATION_SPRAY | Freq: Every day | RESPIRATORY_TRACT | 1 refills | Status: DC

## 2021-11-10 MED ORDER — IOHEXOL 350 MG/ML SOLN
80.0000 mL | Freq: Once | INTRAVENOUS | Status: AC | PRN
  Administered 2021-11-10: 80 mL via INTRAVENOUS

## 2021-11-10 NOTE — ED Provider Notes (Cosign Needed)
[facility] EMERGENCY DEPT Provider Note   CSN: [id number] Arrival date & time: 11/10/21  2015     History Chief Complaint  Patient presents with   Shortness of Breath   abnormal labs    Jordan Taylor is a 55 y.o. male.  With past medical history of type 2 diabetes, hypertension who presents to the emergency department at the instruction of primary care due to abnormal labs.  Patient presents at the instruction of primary care after being seen today for dyspnea on exertion.  Patient had chest x-ray and blood work done today.  Chest x-ray showed small right pleural effusion concerning for atelectasis versus pneumonia.  D-dimer outpatient was 1.7, BNP 21.  Patient states that primary care provider called him and instructed him to present to the emergency department for PE study.  Patient states that son was diagnosed with RSV around Thanksgiving.  He states that both him and his wife had RSV symptoms and upper respiratory infections.  They state that they left for Thanksgiving and returned and was feeling better until early last week.  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  He states that he noticed he was having increasing fatigue, sleeping later than usual and becoming short of breath when walking around the restaurant.  He states that he was having difficulty walking from the parking lot to the primary care office.  He denies chest pain or palpitations during this time.  He states that in total they drove 8 hours over about 3 days during Thanksgiving.  He denies any unilateral leg swelling, hemoptysis, flights, recent surgeries, tobacco use, malignancy..  Denies previous DVT or PE.  Denies history of clotting disorders.  Denies family history of clotting disorders.   Shortness of Breath Associated symptoms: no abdominal pain, no chest pain, no cough, no fever and no vomiting       Past Medical History:  Diagnosis Date   Essential  hypertension 12/04/2013   Type 2 diabetes mellitus (HCC) 12/04/2013    Patient Active Problem List   Diagnosis Date Noted   Cervical radiculitis 04/20/2021   Hyperkalemia 03/04/2021   Elevated LFTs 03/04/2021   Left hand weakness 02/17/2018   BPH (benign prostatic hyperplasia) 02/17/2018   Left hand tendonitis 06/09/2017   Cervical stenosis of spinal canal 09/07/2016   Hypogonadism in male 02/26/2016   Erectile dysfunction 07/02/2014   Morbid obesity (HCC) 12/04/2013   Type 2 diabetes mellitus with unspecified complications (HCC) 12/04/2013   Hypertension associated with diabetes (HCC) 12/04/2013   Hyperlipidemia associated with type 2 diabetes mellitus (HCC) 12/04/2013    Past Surgical History:  Procedure Laterality Date   ANTERIOR CERVICAL DECOMPRESSION/DISCECTOMY FUSION 4 LEVELS N/A 09/07/2016   Procedure: Anterior Cervical Diskectomy Fusion - Cervical three- Cervical four - Cervical four - Cervical five - Cervical five- Cervical six  - Cervical six - Cervical seven;  Surgeon: Julio Sicks, MD;  Location: [facility] OR;  Service: Neurosurgery;  Laterality: N/A;   KNEE ARTHROSCOPY Left    spinal cord surgery     20+ yrs ago       Family History  Problem Relation Age of Onset   Alcohol abuse Mother    Cancer Mother    Alcohol abuse Father    Diabetes Father    Hypertension Father    Cancer Other     Social History   Tobacco Use   Smoking status: Former   Smokeless tobacco: Never  Advertising account planner  Vaping Use: Never used  Substance Use Topics   Alcohol use: No    Alcohol/week: 0.0 standard drinks   Drug use: No    Comment: use CBD    Home Medications Prior to Admission medications   Medication Sig Start Date End Date Taking? Authorizing Provider  albuterol (VENTOLIN HFA) 108 (90 Base) MCG/ACT inhaler Inhale 2 puffs into the lungs every 6 (six) hours as needed for wheezing. 11/10/21  Yes Clayborne Dana, NP  amLODipine (NORVASC) 10 MG tablet TAKE ONE TABLET BY MOUTH DAILY  09/29/21  Yes Everrett Coombe, DO  benazepril (LOTENSIN) 20 MG tablet TAKE ONE TABLET BY MOUTH DAILY 09/29/21  Yes Everrett Coombe, DO  metFORMIN (GLUCOPHAGE) 1000 MG tablet TAKE ONE TABLET BY MOUTH TWICE A DAY 07/08/21  Yes Everrett Coombe, DO  tamsulosin (FLOMAX) 0.4 MG CAPS capsule TAKE ONE CAPSULE BY MOUTH DAILY AFTER BREAKFAST 07/08/21  Yes Everrett Coombe, DO  azithromycin (ZITHROMAX Z-PAK) 250 MG tablet Take 2 tablets (500 mg) on  Day 1,  followed by 1 tablet (250 mg) once daily on Days 2 through 5. 11/10/21   Clayborne Dana, NP  benzonatate (TESSALON) 200 MG capsule Take 1 capsule (200 mg total) by mouth 2 (two) times daily as needed for cough. 10/31/21   Clayborne Dana, NP  FARXIGA 10 MG TABS tablet TAKE ONE TABLET BY MOUTH DAILY 08/05/21   Everrett Coombe, DO  Fluticasone Propionate, Inhal, (FLOVENT DISKUS) 50 MCG/ACT AEPB Inhale 1 puff into the lungs daily. 11/10/21   Clayborne Dana, NP  phentermine (ADIPEX-P) 37.5 MG tablet Take 1 tablet (37.5 mg total) by mouth every morning. 08/19/21   Everrett Coombe, DO  pregabalin (LYRICA) 75 MG capsule Take 1 capsule (75 mg total) by mouth 2 (two) times daily. 07/08/21   Everrett Coombe, DO    Allergies    Atorvastatin and Penicillins  Review of Systems   Review of Systems  Constitutional:  Positive for activity change and fatigue. Negative for fever.  Respiratory:  Positive for shortness of breath. Negative for cough.   Cardiovascular:  Negative for chest pain, palpitations and leg swelling.  Gastrointestinal:  Negative for abdominal pain, diarrhea, nausea and vomiting.  Neurological:  Negative for dizziness, syncope and light-headedness.  All other systems reviewed and are negative.  Physical Exam Updated Vital Signs BP (!) 148/85 (BP Location: Right Arm)   Pulse (!) 116   Temp 98.2 F (36.8 C)   Resp 18   Ht 5\' 5"  (1.651 m)   Wt 93 kg   SpO2 96%   BMI 34.11 kg/m   Physical Exam Vitals and nursing note reviewed.  Constitutional:       General: He is not in acute distress.    Appearance: Normal appearance. He is well-developed. He is not toxic-appearing.  HENT:     Head: Normocephalic and atraumatic.     Mouth/Throat:     Mouth: Mucous membranes are moist.     Pharynx: Oropharynx is clear.  Eyes:     General: No scleral icterus.    Extraocular Movements: Extraocular movements intact.     Pupils: Pupils are equal, round, and reactive to light.  Neck:     Vascular: No JVD.  Cardiovascular:     Rate and Rhythm: Regular rhythm. Tachycardia present.     Pulses: Normal pulses.     Heart sounds: No murmur heard. Pulmonary:     Effort: Pulmonary effort is normal. No tachypnea or respiratory distress.  Breath sounds: Examination of the right-lower field reveals decreased breath sounds. Decreased breath sounds present. No rhonchi or rales.  Chest:     Chest wall: No tenderness.  Abdominal:     General: Bowel sounds are normal. There is no distension.     Palpations: Abdomen is soft.     Tenderness: There is no abdominal tenderness.  Musculoskeletal:        General: Normal range of motion.     Cervical back: Normal range of motion.     Right lower leg: No tenderness. No edema.     Left lower leg: No tenderness. No edema.  Skin:    General: Skin is warm and dry.     Capillary Refill: Capillary refill takes less than 2 seconds.  Neurological:     General: No focal deficit present.     Mental Status: He is alert and oriented to person, place, and time. Mental status is at baseline.  Psychiatric:        Mood and Affect: Mood normal.        Behavior: Behavior normal.        Thought Content: Thought content normal.        Judgment: Judgment normal.    ED Results / Procedures / Treatments   Labs (all labs ordered are listed, but only abnormal results are displayed) Labs Reviewed  BASIC METABOLIC PANEL - Abnormal; Notable for the following components:      Result Value   Sodium 133 (*)    CO2 19 (*)    Glucose,  Bld 571 (*)    BUN 24 (*)    Creatinine, Ser 1.71 (*)    Calcium 8.5 (*)    GFR, Estimated 46 (*)    All other components within normal limits  CBC WITH DIFFERENTIAL/PLATELET - Abnormal; Notable for the following components:   RBC 3.57 (*)    Hemoglobin 10.2 (*)    HCT 30.5 (*)    All other components within normal limits  TROPONIN I (HIGH SENSITIVITY)   EKG EKG Interpretation  Date/Time:  Monday November 10 2021 20:50:43 EST Ventricular Rate:  114 PR Interval:  160 QRS Duration: 96 QT Interval:  331 QTC Calculation: 456 R Axis:   -51 Text Interpretation: Sinus tachycardia Probable left atrial enlargement Left anterior fascicular block Low voltage, precordial leads Probable anteroseptal infarct, old Confirmed by Gloris Manchester [phone]) on 11/10/2021 10:34:39 PM  Radiology DG Chest 2 View  Result Date: 11/10/2021 CLINICAL DATA:  Cough; assess intra-thoracic pathology. Shortness of breath for 2 weeks. EXAM: CHEST - 2 VIEW COMPARISON:  None. FINDINGS: Partially visualized surgical hardware from ACDF. Normal heart size. Normal mediastinal contour. No pneumothorax. Small right pleural effusion. No left pleural effusion. Faint hazy right lung base opacity. IMPRESSION: Small right pleural effusion. Faint hazy right lung base opacity, nonspecific, potentially atelectasis or mild pneumonia. Chest radiograph follow-up suggested. These results will be called to the ordering clinician or representative by the Radiologist Assistant, and communication documented in the PACS or [employer]. Electronically Signed   By: Delbert Phenix M.D.   On: 11/10/2021 15:58   CT Angio Chest PE W/Cm &/Or Wo Cm  Result Date: 11/10/2021 CLINICAL DATA:  Pulmonary embolism (PE) suspected, high prob. Shortness of breath. Elevated D-dimer. EXAM: CT ANGIOGRAPHY CHEST WITH CONTRAST TECHNIQUE: Multidetector CT imaging of the chest was performed using the standard protocol during bolus administration of intravenous contrast.  Multiplanar CT image reconstructions and MIPs were obtained to evaluate the vascular  anatomy. CONTRAST:  80mL OMNIPAQUE IOHEXOL 350 MG/ML SOLN COMPARISON:  None. FINDINGS: Cardiovascular: No filling defects in the pulmonary arteries to suggest pulmonary emboli. Heart is normal size. Aorta normal caliber. Small to moderate pericardial effusion. Mediastinum/Nodes: Mediastinal adenopathy. Right paratracheal lymph node has a short axis diameter of 12 mm. Prevascular lymph node has a short axis diameter of 13 mm. AP window lymph node has a short axis diameter of 12 mm. Bilateral hilar adenopathy with index right hilar lymph node having a short axis diameter of 16 mm and left hilar lymph node having a short axis diameter of 13 mm. No axillary adenopathy. Trachea and esophagus are unremarkable. Thyroid unremarkable. Lungs/Pleura: Small right pleural effusion.  Bibasilar atelectasis. Upper Abdomen: Imaging into the upper abdomen demonstrates no acute findings. Musculoskeletal: Chest wall soft tissues are unremarkable. No acute bony abnormality. Review of the MIP images confirms the above findings. IMPRESSION: No evidence of pulmonary embolus. Mild mediastinal and bilateral hilar adenopathy. While this could reflect reactive, recommend follow-up CT in 3-6 months to assess stability. Small right pleural effusion. Bibasilar atelectasis. Small to moderate pericardial effusion. Electronically Signed   By: Charlett Nose M.D.   On: 11/10/2021 22:05    Procedures Procedures   Medications Ordered in ED Medications  lactated ringers bolus 1,000 mL (has no administration in time range)  iohexol (OMNIPAQUE) 350 MG/ML injection 80 mL (80 mLs Intravenous Contrast Given 11/10/21 2146)    ED Course  I have reviewed the triage vital signs and the nursing notes.  Pertinent labs & imaging results that were available during my care of the patient were reviewed by me and considered in my medical decision making (see chart for  details).    MDM Rules/Calculators/A&P 55 year old male who presents to the emergency department dyspnea on exertion and elevated D-dimer at primary care.  CT angio here in the emergency department shows no evidence of pulmonary embolus.  It does show mild mediastinal and bilateral hilar adenopathy with recommendation for follow-up in 3 to 6 months.  There is a small to moderate pericardial effusion. EKG with sinus tachycardia BMP with glucose to 571 Patient getting 1 L IV fluids.  Handoff to Dr. Durwin Nora at this time.  Patient pending fluid bolus, delta troponin.  Disposition pending response to fluids. Final Clinical Impression(s) / ED Diagnoses Final diagnoses:  None    Rx / DC Orders ED Discharge Orders     None        Cristopher Peru, PA-C 11/10/21 2238

## 2021-11-10 NOTE — ED Triage Notes (Signed)
Pt arrives POV.   Was seen earlier today by PCP due to shortness of breath x2 weeks.  PCP later contacted pt by phone to advise d-dimer was elevated and instructed pt to go to nearest ED for CT scan.

## 2021-11-10 NOTE — Patient Instructions (Addendum)
Blood work today.  Chest xray today.  We will update you with these results as they come in.  For now, prefer to hold of on oral steroids because you just did a course and we don't want to raise your blood sugars even higher.  Will try a daily inhaler (Flovent) and a rescue inhaler (Albuterol) Your oxygen drops significantly with ambulation, you need to take it easy. If you start dropping below 88% at rest, let us know because you may need supplemental oxygen.

## 2021-11-10 NOTE — Progress Notes (Signed)
3-minute walk 0 min = 93%, 123bpm 1 min = 90%, 126bpm 2 min = 86%, 131bpm 3 min = 85%, 134bpm

## 2021-11-10 NOTE — Progress Notes (Signed)
While reviewing charts tonight, I saw patient's labs were back with multiple abnormalities. Most alarming was elevated d-dimer. Given his presentation at the office (dyspneic, tachycardic, etc). I personally called the patient and recommended he go to the ED for further evaluation and treatment. Patient was agreeable. He plans to go to a [facility] facility tonight.   Lollie Marrow Reola Calkins, DNP, FNP-C

## 2021-11-10 NOTE — Telephone Encounter (Signed)
Tracey with [facility] called with CXR results:  Small right pleural effusion, faint, hazy right lung base opacity, non specific. Potential atelectasis or mild pneumonia.  Chest radiograph suggested.

## 2021-11-10 NOTE — Telephone Encounter (Signed)
Pt informed of results.  Pt expressed understanding and is agreeable.  Pt states that the Flovent inhaler was going to cost him $185, therefore he did not get it.  Pt would like to know if there is another steroid inhaler that you can send in for him.  Please advise.  Tiajuana Amass, CMA

## 2021-11-10 NOTE — Progress Notes (Signed)
Acute Office Visit  Subjective:    Patient ID: Jordan Taylor, male    DOB: Oct 13, 1966, 55 y.o.   MRN: [id number]  Chief Complaint  Patient presents with   Shortness of Breath    Shortness of Breath  Patient is in today for shortness of breath.  He reports his cough/abdominal pain from last visit has improved.  States over the past week he has had a worsening time breathing.  States he feels like he is having trouble taking a deep breath.  He gets short of breath with minimal exertion.  Also reporting he is easily fatigued.  States he cannot walk from the parking lot to our building without getting short of breath and needing to take a break.  He denies any chest pain, edema, cough, headaches, swelling.      Past Medical History:  Diagnosis Date   Essential hypertension 12/04/2013   Type 2 diabetes mellitus (HCC) 12/04/2013    Past Surgical History:  Procedure Laterality Date   ANTERIOR CERVICAL DECOMPRESSION/DISCECTOMY FUSION 4 LEVELS N/A 09/07/2016   Procedure: Anterior Cervical Diskectomy Fusion - Cervical three- Cervical four - Cervical four - Cervical five - Cervical five- Cervical six  - Cervical six - Cervical seven;  Surgeon: Julio Sicks, MD;  Location: [facility] OR;  Service: Neurosurgery;  Laterality: N/A;   KNEE ARTHROSCOPY Left    spinal cord surgery     20+ yrs ago    Family History  Problem Relation Age of Onset   Alcohol abuse Mother    Cancer Mother    Alcohol abuse Father    Diabetes Father    Hypertension Father    Cancer Other     Social History   Socioeconomic History   Marital status: Legally Separated    Spouse name: [name]   Number of children: 5   Years of education: College   Highest education level: Not on file  Occupational History   Occupation: Clinical cytogeneticist: Artist  Tobacco Use   Smoking status: Former   Smokeless tobacco: Never  Substance and Sexual Activity   Alcohol use: No    Alcohol/week: 0.0  standard drinks   Drug use: No   Sexual activity: Yes    Partners: Female  Other Topics Concern   Not on file  Social History Narrative   Lives at home w/ his wife   Right-handed   Drinks about 10 cans of soda per day      Social Determinants of Health   Financial Resource Strain: Not on file  Food Insecurity: Not on file  Transportation Needs: Not on file  Physical Activity: Not on file  Stress: Not on file  Social Connections: Not on file  Intimate Partner Violence: Not on file    Outpatient Medications Prior to Visit  Medication Sig Dispense Refill   amLODipine (NORVASC) 10 MG tablet TAKE ONE TABLET BY MOUTH DAILY 90 tablet 2   benazepril (LOTENSIN) 20 MG tablet TAKE ONE TABLET BY MOUTH DAILY 90 tablet 2   benzonatate (TESSALON) 200 MG capsule Take 1 capsule (200 mg total) by mouth 2 (two) times daily as needed for cough. 20 capsule 0   FARXIGA 10 MG TABS tablet TAKE ONE TABLET BY MOUTH DAILY 90 tablet 1   metFORMIN (GLUCOPHAGE) 1000 MG tablet TAKE ONE TABLET BY MOUTH TWICE A DAY 180 tablet 3   phentermine (ADIPEX-P) 37.5 MG tablet Take 1 tablet (37.5 mg total) by mouth every morning.  30 tablet 2   pregabalin (LYRICA) 75 MG capsule Take 1 capsule (75 mg total) by mouth 2 (two) times daily. 60 capsule 3   tamsulosin (FLOMAX) 0.4 MG CAPS capsule TAKE ONE CAPSULE BY MOUTH DAILY AFTER BREAKFAST 30 capsule 2   No facility-administered medications prior to visit.    Allergies  Allergen Reactions   Atorvastatin Other (See Comments)    Severe muscle pain/Weakness   Penicillins Swelling    Review of Systems  Respiratory:  Positive for shortness of breath.   All review of systems negative except what is listed in the HPI     Objective:    Physical Exam Constitutional:      Appearance: He is well-developed.  HENT:     Head: Normocephalic and atraumatic.  Cardiovascular:     Rate and Rhythm: Normal rate and regular rhythm.  Pulmonary:     Effort: Pulmonary effort is  normal. Tachypnea present.     Breath sounds: Normal breath sounds. No wheezing or rhonchi.     Comments: Mildly diminished breath sounds Skin:    General: Skin is warm and dry.  Neurological:     General: No focal deficit present.     Mental Status: He is alert and oriented to person, place, and time.  Psychiatric:        Mood and Affect: Mood normal.        Behavior: Behavior normal.    BP (!) 155/76 (BP Location: Left Arm, Patient Position: Sitting, Cuff Size: Normal)   Pulse (!) 120   Temp 97.9 F (36.6 C)   Ht 5\' 5"  (1.651 m)   Wt 209 lb (94.8 kg)   SpO2 98%   BMI 34.78 kg/m  Wt Readings from Last 3 Encounters:  11/10/21 209 lb (94.8 kg)  10/31/21 212 lb (96.2 kg)  08/19/21 204 lb (92.5 kg)    Health Maintenance Due  Topic Date Due   Zoster Vaccines- Shingrix (1 of 2) Never done   Pneumococcal Vaccine 76-56 Years old (2 - PCV) 09/24/2017   COVID-19 Vaccine (3 - Booster for Moderna series) 05/26/2020   FOOT EXAM  04/03/2021    There are no preventive care reminders to display for this patient.   Lab Results  Component Value Date   TSH 0.99 07/26/2019   Lab Results  Component Value Date   WBC 12.1 (H) 10/31/2021   HGB 11.2 (L) 10/31/2021   HCT 33.9 (L) 10/31/2021   MCV 87.1 10/31/2021   PLT 259 10/31/2021   Lab Results  Component Value Date   NA 136 10/31/2021   K 4.7 10/31/2021   CO2 20 10/31/2021   GLUCOSE 336 (H) 10/31/2021   BUN 22 10/31/2021   CREATININE 1.44 (H) 10/31/2021   BILITOT 0.6 10/31/2021   ALKPHOS 109 01/28/2015   AST 23 10/31/2021   ALT 54 (H) 10/31/2021   PROT 6.4 10/31/2021   ALBUMIN 3.8 04/07/2017   CALCIUM 8.6 10/31/2021   ANIONGAP 8 03/05/2021   Lab Results  Component Value Date   CHOL 162 02/20/2021   Lab Results  Component Value Date   HDL 34 (L) 02/20/2021   Lab Results  Component Value Date   LDLCALC 102 (H) 02/20/2021   Lab Results  Component Value Date   TRIG 162 (H) 02/20/2021   Lab Results  Component  Value Date   CHOLHDL 4.8 02/20/2021   Lab Results  Component Value Date   HGBA1C 7.3 (A) 08/19/2021  Assessment & Plan:   1. Dyspnea on exertion 2. Fatigue, unspecified type Blood work today.  Chest xray today.  We will update you with these results as they come in.  For now, prefer to hold off on oral steroids because you just did a course and we don't want to raise your blood sugars even higher.  Will try a daily inhaler (Flovent) and a rescue inhaler (Albuterol) Your oxygen drops significantly with ambulation, you need to take it easy. If you start dropping below 88% at rest, let us know because you may need supplemental oxygen. If consistently below 88% or if chest pain develops, go to the ED. Work note provided for very light duty (patient did not want to skip work altogether).   Strict ED precautions discussed.   - albuterol (VENTOLIN HFA) 108 (90 Base) MCG/ACT inhaler; Inhale 2 puffs into the lungs every 6 (six) hours as needed for wheezing.  Dispense: 2 each; Refill: 11 - Fluticasone Propionate, Inhal, (FLOVENT DISKUS) 50 MCG/ACT AEPB; Inhale 1 puff into the lungs daily.  Dispense: 1 each; Refill: 1 - DG Chest 2 View; Future - CBC with Differential - D-dimer, quantitative - Brain natriuretic peptide - COMPLETE METABOLIC PANEL WITH GFR    Follow-up by the end of the week or sooner if needed.   Discussed case with Dr. Benjamin Stain.     Ladona Ridgel B. Reola Calkins, DNP, FNP-C

## 2021-11-10 NOTE — Telephone Encounter (Signed)
Patient is being seen in office today

## 2021-11-11 ENCOUNTER — Ambulatory Visit (INDEPENDENT_AMBULATORY_CARE_PROVIDER_SITE_OTHER): Payer: BC Managed Care – PPO | Admitting: Cardiology

## 2021-11-11 ENCOUNTER — Telehealth (HOSPITAL_BASED_OUTPATIENT_CLINIC_OR_DEPARTMENT_OTHER): Payer: Self-pay | Admitting: Family Medicine

## 2021-11-11 ENCOUNTER — Encounter: Payer: Self-pay | Admitting: Cardiology

## 2021-11-11 VITALS — BP 148/70 | HR 80 | Resp 20 | Ht 65.0 in | Wt 206.8 lb

## 2021-11-11 DIAGNOSIS — I3139 Other pericardial effusion (noninflammatory): Secondary | ICD-10-CM

## 2021-11-11 DIAGNOSIS — J205 Acute bronchitis due to respiratory syncytial virus: Secondary | ICD-10-CM

## 2021-11-11 DIAGNOSIS — R0602 Shortness of breath: Secondary | ICD-10-CM | POA: Diagnosis present

## 2021-11-11 LAB — RESP PANEL BY RT-PCR (FLU A&B, COVID) ARPGX2
Influenza A by PCR: NEGATIVE
Influenza B by PCR: NEGATIVE
SARS Coronavirus 2 by RT PCR: NEGATIVE

## 2021-11-11 LAB — URINALYSIS, ROUTINE W REFLEX MICROSCOPIC
Bilirubin Urine: NEGATIVE
Glucose, UA: >1000 mg/dL — AB
Ketones, ur: NEGATIVE mg/dL
Leukocytes,Ua: NEGATIVE
Nitrite: NEGATIVE
Specific Gravity, Urine: 1.025 (ref 1.005–1.030)
pH: 5 (ref 5.0–8.0)

## 2021-11-11 LAB — I-STAT VENOUS BLOOD GAS, ED
Acid-base deficit: 7 mmol/L — ABNORMAL HIGH (ref 0.0–2.0)
Bicarbonate: 16.9 mmol/L — ABNORMAL LOW (ref 20.0–28.0)
Calcium, Ion: 1.27 mmol/L (ref 1.15–1.40)
HCT: 28 % — ABNORMAL LOW (ref 39.0–52.0)
Hemoglobin: 9.5 g/dL — ABNORMAL LOW (ref 13.0–17.0)
O2 Saturation: 78 %
Potassium: 4.8 mmol/L (ref 3.5–5.1)
Sodium: 137 mmol/L (ref 135–145)
TCO2: 18 mmol/L — ABNORMAL LOW (ref 22–32)
pCO2, Ven: 29.1 mmHg — ABNORMAL LOW (ref 44.0–60.0)
pH, Ven: 7.372 (ref 7.250–7.430)
pO2, Ven: 43 mmHg (ref 32.0–45.0)

## 2021-11-11 LAB — CBG MONITORING, ED: Glucose-Capillary: 252 mg/dL — ABNORMAL HIGH (ref 70–99)

## 2021-11-11 LAB — LACTIC ACID, PLASMA
Lactic Acid, Venous: 2.2 mmol/L (ref 0.5–1.9)
Lactic Acid, Venous: 1.9 mmol/L (ref 0.5–1.9)

## 2021-11-11 LAB — TSH: TSH: 1.321 u[IU]/mL (ref 0.350–4.500)

## 2021-11-11 LAB — BRAIN NATRIURETIC PEPTIDE: B Natriuretic Peptide: 23.8 pg/mL (ref 0.0–100.0)

## 2021-11-11 MED ORDER — SODIUM CHLORIDE 0.9 % IV BOLUS
1000.0000 mL | Freq: Once | INTRAVENOUS | Status: AC
  Administered 2021-11-11: 1000 mL via INTRAVENOUS

## 2021-11-11 MED ORDER — AEROCHAMBER PLUS FLO-VU LARGE MISC
1.0000 | Freq: Once | Status: AC
  Administered 2021-11-11: 1
  Filled 2021-11-11: qty 1

## 2021-11-11 MED ORDER — IPRATROPIUM BROMIDE 0.02 % IN SOLN
0.5000 mg | Freq: Once | RESPIRATORY_TRACT | Status: AC
  Administered 2021-11-11: 0.5 mg via RESPIRATORY_TRACT
  Filled 2021-11-11: qty 2.5

## 2021-11-11 MED ORDER — AMLODIPINE BESYLATE 5 MG PO TABS: 10.0000 mg | ORAL_TABLET | Freq: Every day | ORAL | Status: DC

## 2021-11-11 MED ORDER — ALBUTEROL SULFATE HFA 108 (90 BASE) MCG/ACT IN AERS
4.0000 | INHALATION_SPRAY | Freq: Once | RESPIRATORY_TRACT | Status: AC
  Administered 2021-11-11: 4 via RESPIRATORY_TRACT
  Filled 2021-11-11: qty 6.7

## 2021-11-11 NOTE — Progress Notes (Addendum)
Inpatient Diabetes Program Recommendations  AACE/ADA: New Consensus Statement on Inpatient Glycemic Control (2015)  Target Ranges:  Prepandial:   less than 140 mg/dL      Peak postprandial:   less than 180 mg/dL (1-2 hours)      Critically ill patients:  140 - 180 mg/dL   Lab Results  Component Value Date   GLUCAP 252 (H) 11/11/2021   HGBA1C 7.3 (A) 08/19/2021    Review of Glycemic Control PE/hyperglycemia Diabetes history: DM 2 Outpatient Diabetes Medications: Farxiga 10 mg Daily, metformin 1000 mg bid Current orders for Inpatient glycemic control:  None  Note: Glucose 571 on presentation, possible mild DKA CO2 19 gap normal, most likely from [place] medication (SGLT-2)  Inpatient Diabetes Program Recommendations:    -  Check CBG achs -  Start Semglee 10 units Q24 hours -  Start Novolog 0-15 units tid + hs scale  Last A1c 7.3% on 08/19/2021  Would d/c Farxiga at d/c and follow up with prescriber. Will follow pt.  Thanks,  Christena Deem RN, MSN, BC-ADM Inpatient Diabetes Coordinator Team Pager [phone] (8a-5p)

## 2021-11-11 NOTE — ED Notes (Signed)
Patient verbalizes understanding of discharge instructions and signed AMA form. Opportunity for questioning and answers were provided by this RN and Dr. Lockie Mola. Patient discharged from ED.

## 2021-11-11 NOTE — ED Notes (Signed)
Spouse Logan would like to be contacted if patient is admitted before she comes back at in the morning

## 2021-11-11 NOTE — ED Provider Notes (Signed)
Patient has decided to leave AGAINST MEDICAL ADVICE.  Overnight team at signout I just talked to him about staying in as he wanted to leave AGAINST MEDICAL ADVICE prior to my arrival.  He was amenable to staying at that time but he no longer wants to stay in the hospital.  He understands the risks and benefits of staying versus leaving.  He understands his current medical process and need for admission for further work-up.  He understands that he could die or have permanent disability.  At this time he prefers to leave AGAINST MEDICAL ADVICE.  I will give him information to follow-up with cardiology and he understands return if symptoms worsen.   Virgina Norfolk, DO 11/11/21 [phone]

## 2021-11-11 NOTE — ED Provider Notes (Signed)
I assumed care of this patient.  Please see previous provider note for further details of Hx, PE.  Briefly patient is a 55 y.o. male who presented for worsening shortness of breath.  Sent here by PCP to rule out PE.  He was noted to be tachycardic and hypoxic with an elevated D-dimer.  Patient apparently had a viral respiratory infection 1 month ago and has been having continued cough and worsening shortness of breath since then.  He has been treated with Tessalon Perles, steroids, albuterol, and antibiotics.  No improvement.  Work-up today has been negative for PE or pneumonia.  CT scan did reveal mild to moderate pericardial effusion with a mild to moderate right pleural effusion.  Noted to have atelectasis but no evidence of pneumonia.  CT also notable for mediastinal and hilar adenopathy.  Favored to be reactive given recent infection. Will also need to consider infiltrative process.  Patient was noted to be hyperglycemic without evidence of DKA.  Receiving IV fluids.  Plan is to hydrate patient and reassess.  After 2 L of IV fluids, patient had no improvement.  Ambulated again and became tachycardic to the 130s and hypoxic to the mid 80s, similar to the clinic.  On med rec, noted phentermine on list. Patient states he only took this once in Sept and stopped due to side effects.  Work-up has been negative for infectious process.  Will require admission for continued work-up and management.  Marland Kitchen1-3 Lead EKG Interpretation Performed by: Nira Conn, MD Authorized by: Nira Conn, MD     Interpretation: abnormal     ECG rate:  118   ECG rate assessment: tachycardic     Rhythm: sinus tachycardia     Ectopy: none     Conduction: normal   .Critical Care Performed by: Nira Conn, MD Authorized by: Nira Conn, MD   Critical care provider statement:    Critical care time (minutes):  45   Critical care time was exclusive of:  Separately billable  procedures and treating other patients   Critical care was necessary to treat or prevent imminent or life-threatening deterioration of the following conditions:  Endocrine crisis, metabolic crisis and circulatory failure   Critical care was time spent personally by me on the following activities:  Development of treatment plan with patient or surrogate, discussions with consultants, evaluation of patient's response to treatment, examination of patient, obtaining history from patient or surrogate, review of old charts, re-evaluation of patient's condition, pulse oximetry, ordering and review of radiographic studies, ordering and review of laboratory studies and ordering and performing treatments and interventions   Care discussed with: admitting provider             Nira Conn, MD 11/11/21 [phone]

## 2021-11-11 NOTE — Progress Notes (Signed)
Cardiology Office Note   Date:  11/11/2021   ID:  Jordan Taylor, Jordan Taylor 11/11/1966, MRN [id number]  PCP:  Everrett Coombe, DO  Cardiologist:   Harmoni Lucus [place], MD   Chief Complaint  Patient presents with   Shortness of Breath      History of Present Illness: Jordan Taylor is a 55 y.o. male who is seen at the request of Dr Ashley Royalty for evaluation of pericardial effusion. He has a history of HTN and DM. In November he developed an upper respiratory infection with persistent cough, SOB. States he tested positive for RSV. Initially cough was productive. No fever. Seen by PCP and prescribed doxycycline and a steroid taper. Had continue cough and on follow up 12/2 given cough meds. Seen yesterday with persistent symptoms. Noted increased HR and desaturation with walking. D dimer ordered and patient instructed to go to ED. CXR showed some atx. CT done showing no PE but he did have a small pleural effusion and small to moderate pericardial effusion. Reactive mediastinal and hilar adenopathy. BNP and troponins negative. Patient reports a bedside Echo done but no mention of this in EDP notes. He denies any chest pain. No edema. Still SOB with activity.     Past Medical History:  Diagnosis Date   Essential hypertension 12/04/2013   Hyperlipidemia    Type 2 diabetes mellitus (HCC) 12/04/2013    Past Surgical History:  Procedure Laterality Date   ANTERIOR CERVICAL DECOMPRESSION/DISCECTOMY FUSION 4 LEVELS N/A 09/07/2016   Procedure: Anterior Cervical Diskectomy Fusion - Cervical three- Cervical four - Cervical four - Cervical five - Cervical five- Cervical six  - Cervical six - Cervical seven;  Surgeon: Julio Sicks, MD;  Location: [facility] OR;  Service: Neurosurgery;  Laterality: N/A;   CERVICAL SPINE SURGERY N/A    KNEE ARTHROSCOPY Left    spinal cord surgery     20+ yrs ago     Current Outpatient Medications  Medication Sig Dispense Refill   albuterol (VENTOLIN HFA) 108 (90 Base) MCG/ACT  inhaler Inhale 2 puffs into the lungs every 6 (six) hours as needed for wheezing. 2 each 11   amLODipine (NORVASC) 10 MG tablet TAKE ONE TABLET BY MOUTH DAILY 90 tablet 2   azithromycin (ZITHROMAX Z-PAK) 250 MG tablet Take 2 tablets (500 mg) on  Day 1,  followed by 1 tablet (250 mg) once daily on Days 2 through 5. 6 tablet 0   benazepril (LOTENSIN) 20 MG tablet TAKE ONE TABLET BY MOUTH DAILY 90 tablet 2   benzonatate (TESSALON) 200 MG capsule Take 1 capsule (200 mg total) by mouth 2 (two) times daily as needed for cough. 20 capsule 0   FARXIGA 10 MG TABS tablet TAKE ONE TABLET BY MOUTH DAILY 90 tablet 1   Fluticasone Propionate, Inhal, (FLOVENT DISKUS) 50 MCG/ACT AEPB Inhale 1 puff into the lungs daily. 1 each 1   metFORMIN (GLUCOPHAGE) 1000 MG tablet TAKE ONE TABLET BY MOUTH TWICE A DAY 180 tablet 3   phentermine (ADIPEX-P) 37.5 MG tablet Take 1 tablet (37.5 mg total) by mouth every morning. 30 tablet 2   pregabalin (LYRICA) 75 MG capsule Take 1 capsule (75 mg total) by mouth 2 (two) times daily. 60 capsule 3   tamsulosin (FLOMAX) 0.4 MG CAPS capsule TAKE ONE CAPSULE BY MOUTH DAILY AFTER BREAKFAST 30 capsule 2   No current facility-administered medications for this visit.    Allergies:   Atorvastatin and Penicillins    Social History:  The patient  reports  that he has quit smoking. He has never used smokeless tobacco. He reports that he does not drink alcohol and does not use drugs.   Family History:  The patient's family history includes Alcohol abuse in his father and mother; Cancer in his mother, sister, and another family member; Diabetes in his father; Hypertension in his father.    ROS:  Please see the history of present illness.   Otherwise, review of systems are positive for none.   All other systems are reviewed and negative.    PHYSICAL EXAM: VS:  BP (!) 148/70 (BP Location: Left Arm, Patient Position: Sitting, Cuff Size: Normal)    Pulse 80    Resp 20    Ht 5\' 5"  (1.651 m)     Wt 206 lb 12.8 oz (93.8 kg)    SpO2 97%    BMI 34.41 kg/m  , BMI Body mass index is 34.41 kg/m. GEN: Well nourished, well developed, in no acute distress HEENT: normal Neck: no JVD, carotid bruits, or masses Cardiac: RRR; no murmurs, rubs, or gallops,no edema  Respiratory:  clear to auscultation bilaterally, normal work of breathing GI: soft, nontender, nondistended, + BS MS: no deformity or atrophy Skin: warm and dry, no rash Neuro:  Strength and sensation are intact Psych: euthymic mood, full affect   EKG:  EKG is not ordered today. The ekg ordered yesterday demonstrates sinus tachy with low voltage. LAD. No acute change. I have personally reviewed and interpreted this study.    Recent Labs: 11/10/2021: ALT 39; BUN 24; Creatinine, Ser 1.71; Platelets 359 11/11/2021: B Natriuretic Peptide 23.8; Hemoglobin 9.5; Potassium 4.8; Sodium 137; TSH 1.321    Lipid Panel    Component Value Date/Time   CHOL 162 02/20/2021 0957   TRIG 162 (H) 02/20/2021 0957   HDL 34 (L) 02/20/2021 0957   CHOLHDL 4.8 02/20/2021 0957   VLDL 42 (H) 01/28/2015 0831   LDLCALC 102 (H) 02/20/2021 0957      Wt Readings from Last 3 Encounters:  11/11/21 206 lb 12.8 oz (93.8 kg)  11/10/21 205 lb (93 kg)  11/10/21 209 lb (94.8 kg)      Other studies Reviewed: Additional studies/ records that were reviewed today include:  CLINICAL DATA:  Cough; assess intra-thoracic pathology. Shortness of breath for 2 weeks.   EXAM: CHEST - 2 VIEW   COMPARISON:  None.   FINDINGS: Partially visualized surgical hardware from ACDF. Normal heart size. Normal mediastinal contour. No pneumothorax. Small right pleural effusion. No left pleural effusion. Faint hazy right lung base opacity.   IMPRESSION: Small right pleural effusion. Faint hazy right lung base opacity, nonspecific, potentially atelectasis or mild pneumonia. Chest radiograph follow-up suggested.   These results will be called to the ordering  clinician or representative by the Radiologist Assistant, and communication documented in the PACS or [employer].     Electronically Signed   By: Ilona Sorrel M.D.   On: 11/10/2021 15:58   CLINICAL DATA:  Pulmonary embolism (PE) suspected, high prob. Shortness of breath. Elevated D-dimer.   EXAM: CT ANGIOGRAPHY CHEST WITH CONTRAST   TECHNIQUE: Multidetector CT imaging of the chest was performed using the standard protocol during bolus administration of intravenous contrast. Multiplanar CT image reconstructions and MIPs were obtained to evaluate the vascular anatomy.   CONTRAST:  17mL OMNIPAQUE IOHEXOL 350 MG/ML SOLN   COMPARISON:  None.   FINDINGS: Cardiovascular: No filling defects in the pulmonary arteries to suggest pulmonary emboli. Heart is normal size. Aorta normal  caliber. Small to moderate pericardial effusion.   Mediastinum/Nodes: Mediastinal adenopathy. Right paratracheal lymph node has a short axis diameter of 12 mm. Prevascular lymph node has a short axis diameter of 13 mm. AP window lymph node has a short axis diameter of 12 mm. Bilateral hilar adenopathy with index right hilar lymph node having a short axis diameter of 16 mm and left hilar lymph node having a short axis diameter of 13 mm. No axillary adenopathy. Trachea and esophagus are unremarkable. Thyroid unremarkable.   Lungs/Pleura: Small right pleural effusion.  Bibasilar atelectasis.   Upper Abdomen: Imaging into the upper abdomen demonstrates no acute findings.   Musculoskeletal: Chest wall soft tissues are unremarkable. No acute bony abnormality.   Review of the MIP images confirms the above findings.   IMPRESSION: No evidence of pulmonary embolus.   Mild mediastinal and bilateral hilar adenopathy. While this could reflect reactive, recommend follow-up CT in 3-6 months to assess stability.   Small right pleural effusion.   Bibasilar atelectasis.   Small to moderate pericardial  effusion.     Electronically Signed   By: Rolm Baptise M.D.   On: 11/10/2021 22:05   ASSESSMENT AND PLAN:  1.  Small to moderate pericardial effusion noted on CT. No evidence of pericarditis. No rub. No chest pain. No ST changes on Ecg. This is all post inflammatory due to recent pulmonary infection. No evidence of tamponade. I would recommend a formal Echo to assess but I think it is unlikely that this effusion will cause problems.  2. Recent acute bronchitis with apparent RSV. Needs to follow up with PCP 3. HTN.    Current medicines are reviewed at length with the patient today.  The patient does not have concerns regarding medicines.  The following changes have been made:  no change  Labs/ tests ordered today include:   Orders Placed This Encounter  Procedures   ECHOCARDIOGRAM COMPLETE         Disposition:   FU pending results of Echo.  Signed, Avry Monteleone Martinique, MD  11/11/2021 3:46 PM    [facility] [address], [place], [place], [address] Phone [phone], Fax [phone]

## 2021-11-11 NOTE — Discharge Instructions (Signed)
Please return to the emergency department if symptoms worsen as discussed. 

## 2021-11-11 NOTE — Patient Instructions (Signed)
Medication Instructions:  Continue same medications *If you need a refill on your cardiac medications before your next appointment, please call your pharmacy*   Lab Work: None ordered   Testing/Procedures: Echo   Follow-Up: At [facility], you and your health needs are our priority.  As part of our continuing mission to provide you with exceptional heart care, we have created designated Provider Care Teams.  These Care Teams include your primary Cardiologist (physician) and Advanced Practice Providers (APPs -  Physician Assistants and Nurse Practitioners) who all work together to provide you with the care you need, when you need it.  We recommend signing up for the patient portal called "MyChart".  Sign up information is provided on this After Visit Summary.  MyChart is used to connect with patients for Virtual Visits (Telemedicine).  Patients are able to view lab/test results, encounter notes, upcoming appointments, etc.  Non-urgent messages can be sent to your provider as well.   To learn more about what you can do with MyChart, go to [email or web address].      Your next appointment:  To Be Determined after test    The format for your next appointment: Office   Provider:  Dr.Jordan

## 2021-11-11 NOTE — Telephone Encounter (Signed)
Received call from on-call nurse regarding abnormal labs.  Patient's glucose was found to be 485 on outpatient labs.  On-call nurse had contacted the patient to assess current status and patient's point-of-care blood sugar reading was higher than labs drawn earlier in the afternoon and was currently at 515 and patient was eating dinner.  Given trend of blood sugars and that patient was currently eating with suspicion of increasing postprandial blood sugar readings, advised he present to emergency department for further evaluation and management.

## 2021-11-11 NOTE — ED Notes (Signed)
Pt wanting to leave AMA. Dr. Lockie Mola at bedside to talk with  patient.

## 2021-11-11 NOTE — ED Notes (Signed)
In room to meet patient.  Reports he thinks he should leave and go to [facility] by himself.  Explained that the best choice would be to stay and wait for room assignment.  Explained rist of leaving on his own.  Dr. Eudelia Bunch notified and at the bedside speaking with the patient.

## 2021-11-12 ENCOUNTER — Telehealth: Payer: Self-pay | Admitting: General Practice

## 2021-11-12 NOTE — Telephone Encounter (Signed)
Transition Care Management Follow-up Telephone Call Date of discharge and from where: 11/11/21 from [facility] How have you been since you were released from the hospital?  Any questions or concerns? No  Items Reviewed: Did the pt receive and understand the discharge instructions provided? Yes  Medications obtained and verified? No  Other? No  Any new allergies since your discharge? No  Dietary orders reviewed? No Do you have support at home? Yes   Home Care and Equipment/Supplies: Were home health services ordered? no  Functional Questionnaire: (I = Independent and D = Dependent) ADLs: I  Bathing/Dressing- I  Meal Prep- I  Eating- I  Maintaining continence- I  Transferring/Ambulation- I  Managing Meds- I  Follow up appointments reviewed:  PCP Hospital f/u appt confirmed? Yes  Scheduled to see Jordan Hopes, NP on 11/14/21. (Appt was scheduled a while ago). Specialist Hospital f/u appt confirmed? Yes  He saw the Cardiologist on 11/11/21. He is scheduled to have another heart ultrasound.  Are transportation arrangements needed? No  If their condition worsens, is the pt aware to call PCP or go to the Emergency Dept.? Yes Was the patient provided with contact information for the PCP's office or ED? Yes Was to pt encouraged to call back with questions or concerns? Yes

## 2021-11-14 ENCOUNTER — Other Ambulatory Visit: Payer: Self-pay

## 2021-11-14 ENCOUNTER — Encounter: Payer: Self-pay | Admitting: Family Medicine

## 2021-11-14 ENCOUNTER — Ambulatory Visit (INDEPENDENT_AMBULATORY_CARE_PROVIDER_SITE_OTHER): Payer: BC Managed Care – PPO | Admitting: Family Medicine

## 2021-11-14 VITALS — BP 142/78 | HR 114 | Temp 98.5°F | Ht 65.0 in | Wt 204.1 lb

## 2021-11-14 DIAGNOSIS — R7989 Other specified abnormal findings of blood chemistry: Secondary | ICD-10-CM

## 2021-11-14 DIAGNOSIS — Z09 Encounter for follow-up examination after completed treatment for conditions other than malignant neoplasm: Secondary | ICD-10-CM | POA: Diagnosis not present

## 2021-11-14 DIAGNOSIS — N289 Disorder of kidney and ureter, unspecified: Secondary | ICD-10-CM

## 2021-11-14 NOTE — Progress Notes (Signed)
Acute Office Visit  Subjective:    Patient ID: Jordan Taylor, male    DOB: December 18, 1965, 55 y.o.   MRN: [id number]  Chief Complaint  Patient presents with   Shortness of Breath     Patient is in today for shortness of breath follow-up.   11/10/21 Office visit: Patient was seen for dyspnea. Labs and chest xray done - small pleural effusion and atelectasis vs infection - started Zpak and inhalers. D-dimer came back elevated that evening and patient was called and told to go to ED.  11/20/21 ED visit: CT negative for PE but showed small pleural effusion and small pericardial effusion and atelectasis. Glucose and electrolytes were also off. He was given fluids. Remained tachycardic and hypoxic, but patient did not want to be admitted.  11/11/21 Cardiologist visit: Echo was scheduled for after Christmas. No med changes. Vitals stable.    Today he reports he is still short of breath at times, especially with exertion. States his energy level is better - he is able to go to work and complete a 10-hour shift, but has had to delegate several responsibilities to ensure he doesn't over exert himself. He bought a pulse oximeter and has been keeping a close eye on his O2 and HR. States HR is still staying low 100s, but O2 is staying >90% even with exertion. Reports he feels about 20-25% better. He denies any chest pain, sinus pressure, wheezing, headaches, muscle/joint pain.       Past Medical History:  Diagnosis Date   Essential hypertension 12/04/2013   Hyperlipidemia    Type 2 diabetes mellitus ([place]) 12/04/2013    Past Surgical History:  Procedure Laterality Date   ANTERIOR CERVICAL DECOMPRESSION/DISCECTOMY FUSION 4 LEVELS N/A 09/07/2016   Procedure: Anterior Cervical Diskectomy Fusion - Cervical three- Cervical four - Cervical four - Cervical five - Cervical five- Cervical six  - Cervical six - Cervical seven;  Surgeon: Earnie Larsson, MD;  Location: [facility];  Service: Neurosurgery;   Laterality: N/A;   CERVICAL SPINE SURGERY N/A    KNEE ARTHROSCOPY Left    spinal cord surgery     20+ yrs ago    Family History  Problem Relation Age of Onset   Alcohol abuse Mother    Cancer Mother    Alcohol abuse Father    Diabetes Father    Hypertension Father    Cancer Sister    Cancer Other     Social History   Socioeconomic History   Marital status: Legally Separated    Spouse name: [name]   Number of children: 5   Years of education: College   Highest education level: Not on file  Occupational History   Occupation: Engineer, water: Therapist, music  Tobacco Use   Smoking status: Former   Smokeless tobacco: Never  Scientific laboratory technician Use: Never used  Substance and Sexual Activity   Alcohol use: No    Alcohol/week: 0.0 standard drinks   Drug use: No    Comment: use CBD   Sexual activity: Yes    Partners: Female  Other Topics Concern   Not on file  Social History Narrative   Lives at home w/ his wife   Right-handed   Drinks about 10 cans of soda per day      Lobbyist.    Social Determinants of Health   Financial Resource Strain: Not on file  Food Insecurity: Not on file  Transportation Needs:  Not on file  Physical Activity: Not on file  Stress: Not on file  Social Connections: Not on file  Intimate Partner Violence: Not on file    Outpatient Medications Prior to Visit  Medication Sig Dispense Refill   albuterol (VENTOLIN HFA) 108 (90 Base) MCG/ACT inhaler Inhale 2 puffs into the lungs every 6 (six) hours as needed for wheezing. 2 each 11   amLODipine (NORVASC) 10 MG tablet TAKE ONE TABLET BY MOUTH DAILY 90 tablet 2   azithromycin (ZITHROMAX Z-PAK) 250 MG tablet Take 2 tablets (500 mg) on  Day 1,  followed by 1 tablet (250 mg) once daily on Days 2 through 5. 6 tablet 0   benazepril (LOTENSIN) 20 MG tablet TAKE ONE TABLET BY MOUTH DAILY 90 tablet 2   benzonatate (TESSALON) 200 MG capsule Take 1 capsule (200 mg  total) by mouth 2 (two) times daily as needed for cough. 20 capsule 0   FARXIGA 10 MG TABS tablet TAKE ONE TABLET BY MOUTH DAILY 90 tablet 1   Fluticasone Propionate, Inhal, (FLOVENT DISKUS) 50 MCG/ACT AEPB Inhale 1 puff into the lungs daily. 1 each 1   metFORMIN (GLUCOPHAGE) 1000 MG tablet TAKE ONE TABLET BY MOUTH TWICE A DAY 180 tablet 3   phentermine (ADIPEX-P) 37.5 MG tablet Take 1 tablet (37.5 mg total) by mouth every morning. 30 tablet 2   pregabalin (LYRICA) 75 MG capsule Take 1 capsule (75 mg total) by mouth 2 (two) times daily. 60 capsule 3   tamsulosin (FLOMAX) 0.4 MG CAPS capsule TAKE ONE CAPSULE BY MOUTH DAILY AFTER BREAKFAST 30 capsule 2   No facility-administered medications prior to visit.    Allergies  Allergen Reactions   Atorvastatin Other (See Comments)    Severe muscle pain/Weakness   Penicillins Swelling    Review of systems: All review of systems negative except what is listed in the HPI      Objective:    Physical Exam Vitals reviewed.  Constitutional:      Appearance: He is well-developed.  HENT:     Head: Normocephalic and atraumatic.  Cardiovascular:     Rate and Rhythm: Normal rate and regular rhythm.  Pulmonary:     Effort: Pulmonary effort is normal.     Breath sounds: Normal breath sounds.  Musculoskeletal:     Right lower leg: No edema.     Left lower leg: No edema.  Skin:    General: Skin is warm and dry.  Neurological:     General: No focal deficit present.     Mental Status: He is alert.  Psychiatric:        Mood and Affect: Mood normal.        Behavior: Behavior normal.    BP (!) 142/78 (BP Location: Left Arm, Patient Position: Sitting, Cuff Size: Normal)    Pulse (!) 114    Temp 98.5 F (36.9 C) (Oral)    Ht 5' 5" (1.651 m)    Wt 204 lb 1.9 oz (92.6 kg)    SpO2 96%    BMI 33.97 kg/m  Wt Readings from Last 3 Encounters:  11/14/21 204 lb 1.9 oz (92.6 kg)  11/11/21 206 lb 12.8 oz (93.8 kg)  11/10/21 205 lb (93 kg)    Health  Maintenance Due  Topic Date Due   FOOT EXAM  04/03/2021    There are no preventive care reminders to display for this patient.   Lab Results  Component Value Date   TSH 1.321 11/11/2021  Lab Results  Component Value Date   WBC 9.4 11/10/2021   HGB 9.5 (L) 11/11/2021   HCT 28.0 (L) 11/11/2021   MCV 85.4 11/10/2021   PLT 359 11/10/2021   Lab Results  Component Value Date   NA 137 11/11/2021   K 4.8 11/11/2021   CO2 19 (L) 11/10/2021   GLUCOSE 571 (HH) 11/10/2021   BUN 24 (H) 11/10/2021   CREATININE 1.71 (H) 11/10/2021   BILITOT 0.5 11/10/2021   ALKPHOS 109 01/28/2015   AST 14 11/10/2021   ALT 39 11/10/2021   PROT 6.5 11/10/2021   ALBUMIN 3.8 04/07/2017   CALCIUM 8.5 (L) 11/10/2021   ANIONGAP 11 11/10/2021   EGFR 47 (L) 11/10/2021   Lab Results  Component Value Date   CHOL 162 02/20/2021   Lab Results  Component Value Date   HDL 34 (L) 02/20/2021   Lab Results  Component Value Date   LDLCALC 102 (H) 02/20/2021   Lab Results  Component Value Date   TRIG 162 (H) 02/20/2021   Lab Results  Component Value Date   CHOLHDL 4.8 02/20/2021   Lab Results  Component Value Date   HGBA1C 7.3 (A) 08/19/2021       Assessment & Plan:   1. Hospital discharge follow-up 2. Abnormal CBC 3. Abnormal kidney function Patient has improved since last visit. States he is feeling at least 20-25% better. Repeating labs today to ensure return to baseline. Keep upcoming appointment for Echo. Scheduled to see PCP in about 4 weeks. Discussed pulmonary hygiene. Continue monitoring HR and O2. Patient aware of signs/symptoms requiring further/urgent evaluation - strict ED precautions.   - CBC with Differential/Platelet - Comprehensive metabolic panel   Please contact office for sooner follow-up if symptoms do not improve or worsen. Seek emergency care if symptoms become severe.   Purcell Nails Olevia Bowens, DNP, FNP-C

## 2021-11-14 NOTE — Patient Instructions (Signed)
Rechecking labs today to ensure they are returning to baseline.  Continue monitoring your oxygen level and heart rate Keep upcoming appointments Take it easy, do not over exert yourself  Please contact office for sooner follow-up if symptoms do not improve or worsen. Seek emergency care if symptoms become severe.

## 2021-11-24 ENCOUNTER — Encounter: Payer: Self-pay | Admitting: Family Medicine

## 2021-11-25 MED ORDER — METFORMIN HCL 1000 MG PO TABS: ORAL_TABLET | ORAL | 3 refills | Status: DC

## 2021-11-26 ENCOUNTER — Ambulatory Visit (HOSPITAL_COMMUNITY): Payer: BC Managed Care – PPO | Attending: Cardiology

## 2021-11-26 ENCOUNTER — Other Ambulatory Visit: Payer: Self-pay

## 2021-11-26 DIAGNOSIS — I3139 Other pericardial effusion (noninflammatory): Secondary | ICD-10-CM

## 2021-11-26 DIAGNOSIS — J205 Acute bronchitis due to respiratory syncytial virus: Secondary | ICD-10-CM

## 2021-11-26 LAB — ECHOCARDIOGRAM COMPLETE
Area-P 1/2: 4.8 cm2
S' Lateral: 3.5 cm

## 2021-11-26 NOTE — Progress Notes (Unsigned)
Consulted Dr. Lalla Brothers (DOD0. Per Dr. Lalla Brothers, patient okay to be discharged.

## 2021-12-02 ENCOUNTER — Telehealth: Payer: Self-pay | Admitting: Cardiology

## 2021-12-02 NOTE — Telephone Encounter (Signed)
Pt returning call for Echo results

## 2021-12-02 NOTE — Telephone Encounter (Signed)
Called patient, advised of ECHO results.  Patient aware.   Will route to primary nurse.  Thanks!

## 2021-12-11 DIAGNOSIS — K824 Cholesterolosis of gallbladder: Secondary | ICD-10-CM | POA: Diagnosis not present

## 2021-12-12 ENCOUNTER — Emergency Department (HOSPITAL_BASED_OUTPATIENT_CLINIC_OR_DEPARTMENT_OTHER)
Admission: EM | Admit: 2021-12-12 | Discharge: 2021-12-12 | Disposition: A | Discharge status: Home / Self Care | Payer: No Typology Code available for payment source | Attending: Emergency Medicine | Admitting: Emergency Medicine

## 2021-12-12 ENCOUNTER — Other Ambulatory Visit: Payer: Self-pay

## 2021-12-12 ENCOUNTER — Emergency Department (HOSPITAL_BASED_OUTPATIENT_CLINIC_OR_DEPARTMENT_OTHER): Payer: No Typology Code available for payment source

## 2021-12-12 ENCOUNTER — Encounter (HOSPITAL_BASED_OUTPATIENT_CLINIC_OR_DEPARTMENT_OTHER): Payer: Self-pay

## 2021-12-12 DIAGNOSIS — W01198A Fall on same level from slipping, tripping and stumbling with subsequent striking against other object, initial encounter: Secondary | ICD-10-CM | POA: Diagnosis not present

## 2021-12-12 DIAGNOSIS — I10 Essential (primary) hypertension: Secondary | ICD-10-CM | POA: Insufficient documentation

## 2021-12-12 DIAGNOSIS — S0101XA Laceration without foreign body of scalp, initial encounter: Secondary | ICD-10-CM | POA: Insufficient documentation

## 2021-12-12 DIAGNOSIS — Z79899 Other long term (current) drug therapy: Secondary | ICD-10-CM | POA: Diagnosis not present

## 2021-12-12 DIAGNOSIS — Z23 Encounter for immunization: Secondary | ICD-10-CM | POA: Diagnosis not present

## 2021-12-12 DIAGNOSIS — E119 Type 2 diabetes mellitus without complications: Secondary | ICD-10-CM | POA: Diagnosis not present

## 2021-12-12 DIAGNOSIS — Y990 Civilian activity done for income or pay: Secondary | ICD-10-CM | POA: Insufficient documentation

## 2021-12-12 DIAGNOSIS — R000 Tachycardia, unspecified: Secondary | ICD-10-CM | POA: Diagnosis not present

## 2021-12-12 DIAGNOSIS — Z7984 Long term (current) use of oral hypoglycemic drugs: Secondary | ICD-10-CM | POA: Diagnosis not present

## 2021-12-12 DIAGNOSIS — S0990XA Unspecified injury of head, initial encounter: Secondary | ICD-10-CM | POA: Diagnosis not present

## 2021-12-12 MED ORDER — OXYCODONE-ACETAMINOPHEN 5-325 MG PO TABS
1.0000 | ORAL_TABLET | Freq: Once | ORAL | Status: AC
  Administered 2021-12-12: 1 via ORAL
  Filled 2021-12-12: qty 1

## 2021-12-12 MED ORDER — LIDOCAINE HCL (PF) 1 % IJ SOLN
5.0000 mL | Freq: Once | INTRAMUSCULAR | Status: AC
  Administered 2021-12-12: 5 mL
  Filled 2021-12-12: qty 5

## 2021-12-12 MED ORDER — TETANUS-DIPHTH-ACELL PERTUSSIS 5-2.5-18.5 LF-MCG/0.5 IM SUSY
0.5000 mL | PREFILLED_SYRINGE | Freq: Once | INTRAMUSCULAR | Status: AC
Start: 2021-12-12 — End: 2021-12-12
  Administered 2021-12-12: 0.5 mL via INTRAMUSCULAR
  Filled 2021-12-12: qty 0.5

## 2021-12-12 MED ORDER — ONDANSETRON 4 MG PO TBDP
8.0000 mg | ORAL_TABLET | Freq: Once | ORAL | Status: AC
  Administered 2021-12-12: 8 mg via ORAL
  Filled 2021-12-12: qty 2

## 2021-12-12 NOTE — ED Provider Notes (Signed)
[facility]   CSN: [id number] Arrival date & time: 12/12/21  1231     History  Chief Complaint  Patient presents with   Head Injury    Jordan Taylor is a 56 y.o. male.   Head Injury Associated symptoms: no nausea, no neck pain and no vomiting    Patient with medical history of hypertension, hyperlipidemia, type 2 diabetes presents due to head injury.  Head injury happened acutely while the patient was at work, he dropped a bottle and fell backwards hitting his head against a table.  Denies stepping on any glass, did not lose consciousness.  He is not on blood thinners.  Tetanus not up-to-date.  Fell at 11 AM, its been about 3 hours since injury.  Patient denies any vision changes, headache, nausea, vomiting, dizziness.  He is not having any pain in the neck or elsewhere.  No prodromal symptoms.  Past Medical History:  Diagnosis Date   Essential hypertension 12/04/2013   Hyperlipidemia    Type 2 diabetes mellitus (HCC) 12/04/2013     Home Medications Prior to Admission medications   Medication Sig Start Date End Date Taking? Authorizing Provider  albuterol (VENTOLIN HFA) 108 (90 Base) MCG/ACT inhaler Inhale 2 puffs into the lungs every 6 (six) hours as needed for wheezing. 11/10/21   Clayborne DanaBeck, Taylor B, NP  amLODipine (NORVASC) 10 MG tablet TAKE ONE TABLET BY MOUTH DAILY 09/29/21   Everrett CoombeMatthews, Cody, DO  azithromycin (ZITHROMAX Z-PAK) 250 MG tablet Take 2 tablets (500 mg) on  Day 1,  followed by 1 tablet (250 mg) once daily on Days 2 through 5. 11/10/21   Clayborne DanaBeck, Taylor B, NP  benazepril (LOTENSIN) 20 MG tablet TAKE ONE TABLET BY MOUTH DAILY 09/29/21   Everrett CoombeMatthews, Cody, DO  benzonatate (TESSALON) 200 MG capsule Take 1 capsule (200 mg total) by mouth 2 (two) times daily as needed for cough. 10/31/21   Clayborne DanaBeck, Taylor B, NP  FARXIGA 10 MG TABS tablet TAKE ONE TABLET BY MOUTH DAILY 08/05/21   Everrett CoombeMatthews, Cody, DO  Fluticasone Propionate, Inhal,  (FLOVENT DISKUS) 50 MCG/ACT AEPB Inhale 1 puff into the lungs daily. 11/10/21   Clayborne DanaBeck, Taylor B, NP  metFORMIN (GLUCOPHAGE) 1000 MG tablet TAKE ONE TABLET BY MOUTH TWICE A DAY 11/25/21   Everrett CoombeMatthews, Cody, DO  phentermine (ADIPEX-P) 37.5 MG tablet Take 1 tablet (37.5 mg total) by mouth every morning. 08/19/21   Everrett CoombeMatthews, Cody, DO  pregabalin (LYRICA) 75 MG capsule Take 1 capsule (75 mg total) by mouth 2 (two) times daily. 07/08/21   Everrett CoombeMatthews, Cody, DO  tamsulosin (FLOMAX) 0.4 MG CAPS capsule TAKE ONE CAPSULE BY MOUTH DAILY AFTER BREAKFAST 07/08/21   Everrett CoombeMatthews, Cody, DO      Allergies    Atorvastatin and Penicillins    Review of Systems   Review of Systems  Constitutional:  Negative for fever.  Gastrointestinal:  Negative for nausea and vomiting.  Musculoskeletal:  Negative for neck pain.  Skin:  Positive for wound.  Neurological:  Negative for syncope.   Physical Exam Updated Vital Signs BP (!) 129/103 (BP Location: Right Arm)    Pulse (!) 116    Temp 97.8 F (36.6 C) (Oral)    Resp 20    Ht 5\' 5"  (1.651 m)    Wt 92.1 kg    SpO2 98%    BMI 33.78 kg/m  Physical Exam Vitals and nursing note reviewed. Exam conducted with a chaperone present.  Constitutional:  General: He is not in acute distress.    Appearance: Normal appearance.  HENT:     Head: Normocephalic.     Comments: Laceration to the posterior scalp without any surrounding erythema, no hematoma    Right Ear: Tympanic membrane normal.     Left Ear: Tympanic membrane normal.     Ears:     Comments: No auricular hematoma. No hemotympanum. Negative battle sign.     Nose: Nose normal.     Comments: No clear nasal drainage concerning for CSF leak. No septal hematoma. No nasal crepitus.     Mouth/Throat:     Mouth: Mucous membranes are moist.     Pharynx: Oropharynx is clear.     Comments: No malocclusion or dental fractures. No dental avulsions appreciated.  Eyes:     General: No scleral icterus.    Extraocular Movements:  Extraocular movements intact.     Pupils: Pupils are equal, round, and reactive to light.     Comments: Negative for hyphemia, normal shaped pupil. No restrictions of EOM or pain with EOMs.  Neck:     Comments: No midline tenderness.  Cardiovascular:     Rate and Rhythm: Regular rhythm. Tachycardia present.  Musculoskeletal:     Cervical back: Normal range of motion. No tenderness.  Skin:    Coloration: Skin is not jaundiced.     Comments: See picture.  Roughly 5 cm laceration without any active bleeding or surrounding erythema.  Neurological:     General: No focal deficit present.     Mental Status: He is alert and oriented to person, place, and time. Mental status is at baseline.     Coordination: Coordination normal.     Comments: No facial numbness. The patient is alert and oriented to person, place, and time with normal speech. Cranial nerves III-XII are grossly intact.   Psychiatric:        Mood and Affect: Mood normal.    ED Results / Procedures / Treatments   Labs (all labs ordered are listed, but only abnormal results are displayed) Labs Reviewed - No data to display  EKG None  Radiology No results found.  Procedures .Marland KitchenLaceration Repair  Date/Time: 12/12/2021 3:01 PM Performed by: Theron Arista, PA-C Authorized by: Theron Arista, PA-C   Consent:    Consent obtained:  Verbal   Consent given by:  Patient   Risks discussed:  Infection, need for additional repair, pain, poor cosmetic result and poor wound healing   Alternatives discussed:  No treatment and delayed treatment Universal protocol:    Procedure explained and questions answered to patient or proxy's satisfaction: yes     Relevant documents present and verified: yes     Test results available: yes     Imaging studies available: yes     Required blood products, implants, devices, and special equipment available: yes     Site/side marked: yes     Immediately prior to procedure, a time out was called: yes      Patient identity confirmed:  Verbally with patient Anesthesia:    Anesthesia method:  Local infiltration   Local anesthetic:  Lidocaine 1% w/o epi Laceration details:    Location:  Scalp   Scalp location:  Frontal   Length (cm):  5   Depth (mm):  2 Pre-procedure details:    Preparation:  Patient was prepped and draped in usual sterile fashion Exploration:    Hemostasis achieved with:  Direct pressure   Imaging outcome: foreign body  not noted     Wound exploration: entire depth of wound visualized     Contaminated: no   Treatment:    Area cleansed with:  Povidone-iodine   Amount of cleaning:  Standard   Irrigation solution:  Sterile water   Irrigation volume:  500 ml   Irrigation method:  Pressure wash   Visualized foreign bodies/material removed: no     Debridement:  None Skin repair:    Repair method:  Sutures   Suture size:  4-0   Suture material:  Prolene   Suture technique:  Simple interrupted   Number of sutures:  9 Approximation:    Approximation:  Close Repair type:    Repair type:  Simple Post-procedure details:    Dressing:  Open (no dressing)   Procedure completion:  Tolerated well, no immediate complications    Medications Ordered in ED Medications  ondansetron (ZOFRAN-ODT) disintegrating tablet 8 mg (8 mg Oral Given 12/12/21 1411)  oxyCODONE-acetaminophen (PERCOCET/ROXICET) 5-325 MG per tablet 1 tablet (1 tablet Oral Given 12/12/21 1411)  Tdap (BOOSTRIX) injection 0.5 mL (0.5 mLs Intramuscular Given 12/12/21 1410)  lidocaine (PF) (XYLOCAINE) 1 % injection 5 mL (5 mLs Infiltration Given 12/12/21 1411)    ED Course/ Medical Decision Making/ A&P                           Medical Decision Making  Patient is a 56 year old male presenting due to head injury.  His vitals are stable although he is mildly tachycardic likely secondary to pain.  He is neurologically intact, laceration to the scalp as detailed in physical exam.  It has been greater than 4 hours since  injury occurred at this time, he is without any nausea, vomiting, headache, blurry vision.  Initially I did try to order a CT head and neck, patient declined.  We engaged in shared decision-making and given he is asymptomatic 4 hours after the fact I do not feel strongly he needs CT head and neck at this time.  There is no midline tenderness, no focal deficits.  Strict return precautions were discussed with the patient and he verbalized understanding.  Patient tetanus was updated.  Patient tolerated laceration repair well, we discussed wound care.  Patient questions were answered.  Do not think he needs antibiotics.             Final Clinical Impression(s) / ED Diagnoses Final diagnoses:  Laceration of scalp, initial encounter    Rx / DC Orders ED Discharge Orders     None         Theron Arista, PA-C 12/12/21 1505    Arby Barrette, MD 12/13/21 1332

## 2021-12-12 NOTE — Discharge Instructions (Addendum)
Wash the wound with soap and water.  You can apply topical antibiotic ointment such as mupirocin or Neosporin to the scar.  Keep it out of the sun to help reduce scarring.  After the sutures are removed you can try applying coconut oil or vitamin E oil for 2 months to help reduce the scarring.  If you have signs of infection such as fever, pus, spreading redness please return back to the ED for evaluation. We did not do any imaging of your head or neck.  If you start vomiting, having blurry vision or loss of vision, severe headache or loss of balance please return back to the ED for imaging. Have the sutures removed in 7 days.

## 2021-12-12 NOTE — ED Triage Notes (Addendum)
Pt slipped on a wet floor while at work. Pt struck his head and has approx a 4 inch lac to the back of the head. Wound appears superficial and his hemostatic on arrival. Bandage placed in triage. Denies being on blood thinners.

## 2021-12-12 NOTE — ED Notes (Signed)
EMT-P provided AVS using Teachback Method. Patient verbalizes understanding of Discharge Instructions. Opportunity for Questioning and Answers were provided by EMT-P. Patient Discharged from ED.  ? ?

## 2021-12-16 ENCOUNTER — Telehealth: Payer: Self-pay

## 2021-12-16 NOTE — Telephone Encounter (Signed)
Transition Care Management Unsuccessful Follow-up Telephone Call  Date of discharge and from where:  12/12/2021 from [place]  Attempts:  1st Attempt  Reason for unsuccessful TCM follow-up call:  Left voice message

## 2021-12-17 ENCOUNTER — Other Ambulatory Visit: Payer: Self-pay | Admitting: Surgery

## 2021-12-17 DIAGNOSIS — K824 Cholesterolosis of gallbladder: Secondary | ICD-10-CM

## 2021-12-17 NOTE — Telephone Encounter (Signed)
Transition Care Management Unsuccessful Follow-up Telephone Call  Date of discharge and from where:  12/12/21 from [facility]  Attempts:  2nd Attempt  Reason for unsuccessful TCM follow-up call:  Left voice message

## 2021-12-19 ENCOUNTER — Encounter (HOSPITAL_BASED_OUTPATIENT_CLINIC_OR_DEPARTMENT_OTHER): Payer: Self-pay | Admitting: *Deleted

## 2021-12-19 ENCOUNTER — Other Ambulatory Visit: Payer: Self-pay

## 2021-12-19 ENCOUNTER — Emergency Department (HOSPITAL_BASED_OUTPATIENT_CLINIC_OR_DEPARTMENT_OTHER)
Admission: EM | Admit: 2021-12-19 | Discharge: 2021-12-19 | Disposition: A | Discharge status: Home / Self Care | Payer: No Typology Code available for payment source | Attending: Emergency Medicine | Admitting: Emergency Medicine

## 2021-12-19 DIAGNOSIS — Z79899 Other long term (current) drug therapy: Secondary | ICD-10-CM | POA: Diagnosis not present

## 2021-12-19 DIAGNOSIS — E119 Type 2 diabetes mellitus without complications: Secondary | ICD-10-CM | POA: Insufficient documentation

## 2021-12-19 DIAGNOSIS — I10 Essential (primary) hypertension: Secondary | ICD-10-CM | POA: Diagnosis not present

## 2021-12-19 DIAGNOSIS — Z4802 Encounter for removal of sutures: Secondary | ICD-10-CM | POA: Diagnosis not present

## 2021-12-19 DIAGNOSIS — Z7984 Long term (current) use of oral hypoglycemic drugs: Secondary | ICD-10-CM | POA: Insufficient documentation

## 2021-12-19 DIAGNOSIS — W19XXXD Unspecified fall, subsequent encounter: Secondary | ICD-10-CM | POA: Diagnosis not present

## 2021-12-19 DIAGNOSIS — S0101XD Laceration without foreign body of scalp, subsequent encounter: Secondary | ICD-10-CM | POA: Diagnosis not present

## 2021-12-19 DIAGNOSIS — S0990XD Unspecified injury of head, subsequent encounter: Secondary | ICD-10-CM | POA: Diagnosis present

## 2021-12-19 NOTE — Telephone Encounter (Signed)
Transition Care Management Unsuccessful Follow-up Telephone Call  Date of discharge and from where:  12/12/21 from [facility]  Attempts:  3rd Attempt  Reason for unsuccessful TCM follow-up call:  No answer/busy

## 2021-12-19 NOTE — ED Provider Notes (Signed)
[place] EMERGENCY DEPT Provider Note   CSN: [id number] Arrival date & time: 12/19/21  [id number]     History  Chief Complaint  Patient presents with   Suture / Staple Removal    Branndon Dorsett is a 56 y.o. male.  This is a 56 y.o. male  with significant medical history as below, including hypertension hyperlipidemia, diabetes who presents to the ED with complaint of suture removal.  Patient had a fall approximately 1 week ago, seen in the emergency department, sutures were placed.  He is here today to have them removed.  Since the fall he has had no acute problems.  No headaches, vomiting, nausea, lightheadedness, dizziness, vision changes, hearing changes, numbness or tingling.  He has been ambulatory with a steady gait.  No chest pain or dyspnea.  No fevers or chills.  No drainage from the wound.  He has been keeping the wound clean as recommended by previous treatment team.  His tetanus was updated during previous visit.  He has no acute complaints at this time.    Past Medical History: 12/04/2013: Essential hypertension No date: Hyperlipidemia 12/04/2013: Type 2 diabetes mellitus ([place])     The history is provided by the patient. No language interpreter was used.  Suture / Staple Removal Pertinent negatives include no chest pain, no abdominal pain, no headaches and no shortness of breath.      Home Medications Prior to Admission medications   Medication Sig Start Date End Date Taking? Authorizing Provider  albuterol (VENTOLIN HFA) 108 (90 Base) MCG/ACT inhaler Inhale 2 puffs into the lungs every 6 (six) hours as needed for wheezing. 11/10/21   Terrilyn Saver, NP  amLODipine (NORVASC) 10 MG tablet TAKE ONE TABLET BY MOUTH DAILY 09/29/21   Luetta Nutting, DO  azithromycin (ZITHROMAX Z-PAK) 250 MG tablet Take 2 tablets (500 mg) on  Day 1,  followed by 1 tablet (250 mg) once daily on Days 2 through 5. 11/10/21   Terrilyn Saver, NP  benazepril (LOTENSIN) 20 MG  tablet TAKE ONE TABLET BY MOUTH DAILY 09/29/21   Luetta Nutting, DO  benzonatate (TESSALON) 200 MG capsule Take 1 capsule (200 mg total) by mouth 2 (two) times daily as needed for cough. 10/31/21   Terrilyn Saver, NP  FARXIGA 10 MG TABS tablet TAKE ONE TABLET BY MOUTH DAILY 08/05/21   Luetta Nutting, DO  Fluticasone Propionate, Inhal, (FLOVENT DISKUS) 50 MCG/ACT AEPB Inhale 1 puff into the lungs daily. 11/10/21   Terrilyn Saver, NP  metFORMIN (GLUCOPHAGE) 1000 MG tablet TAKE ONE TABLET BY MOUTH TWICE A DAY 11/25/21   Luetta Nutting, DO  phentermine (ADIPEX-P) 37.5 MG tablet Take 1 tablet (37.5 mg total) by mouth every morning. 08/19/21   Luetta Nutting, DO  pregabalin (LYRICA) 75 MG capsule Take 1 capsule (75 mg total) by mouth 2 (two) times daily. 07/08/21   Luetta Nutting, DO  tamsulosin (FLOMAX) 0.4 MG CAPS capsule TAKE ONE CAPSULE BY MOUTH DAILY AFTER BREAKFAST 07/08/21   Luetta Nutting, DO      Allergies    Atorvastatin and Penicillins    Review of Systems   Review of Systems  Constitutional:  Negative for chills and fever.  HENT:  Negative for facial swelling and trouble swallowing.   Eyes:  Negative for photophobia and visual disturbance.  Respiratory:  Negative for cough and shortness of breath.   Cardiovascular:  Negative for chest pain and palpitations.  Gastrointestinal:  Negative for abdominal pain, nausea and vomiting.  Endocrine: Negative for polydipsia and polyuria.  Genitourinary:  Negative for difficulty urinating and hematuria.  Musculoskeletal:  Negative for gait problem and joint swelling.  Skin:  Positive for wound. Negative for pallor and rash.  Neurological:  Negative for syncope and headaches.  Psychiatric/Behavioral:  Negative for agitation and confusion.    Physical Exam Updated Vital Signs BP 140/74 (BP Location: Right Arm)    Pulse 96    Temp 97.8 F (36.6 C)    Resp 18    SpO2 99%  Physical Exam Vitals and nursing note reviewed.  Constitutional:      General:  He is not in acute distress.    Appearance: He is well-developed.  HENT:     Head: Normocephalic.     Comments: Appropriately healing laceration to posterior portion of scalp, sutures are in place, wound appears to be approximate appropriately.  No evidence of infection or cellulitis.  No drainage.  Sutures appear intact.    Right Ear: External ear normal.     Left Ear: External ear normal.     Mouth/Throat:     Mouth: Mucous membranes are moist.  Eyes:     General: No scleral icterus.       Right eye: No discharge.        Left eye: No discharge.     Extraocular Movements: Extraocular movements intact.     Pupils: Pupils are equal, round, and reactive to light.  Cardiovascular:     Rate and Rhythm: Normal rate.     Pulses: Normal pulses.  Pulmonary:     Effort: Pulmonary effort is normal. No respiratory distress.     Breath sounds: Normal breath sounds. No wheezing.  Abdominal:     General: Abdomen is flat. There is no distension.     Palpations: Abdomen is soft.  Musculoskeletal:        General: Normal range of motion.     Cervical back: Normal range of motion.     Right lower leg: No edema.     Left lower leg: No edema.  Skin:    General: Skin is warm and dry.     Capillary Refill: Capillary refill takes less than 2 seconds.  Neurological:     Mental Status: He is alert and oriented to person, place, and time.     GCS: GCS eye subscore is 4. GCS verbal subscore is 5. GCS motor subscore is 6.     Gait: Gait is intact. Gait normal.  Psychiatric:        Mood and Affect: Mood normal.        Behavior: Behavior normal.    ED Results / Procedures / Treatments   Labs (all labs ordered are listed, but only abnormal results are displayed) Labs Reviewed - No data to display  EKG None  Radiology No results found.  Procedures Procedures    Medications Ordered in ED Medications - No data to display  ED Course/ Medical Decision Making/ A&P                            Medical Decision Making   CC: Suture removal  Patient resents emerged part for removal of sutures.   Vital signs were reviewed. Serious etiologies considered.  Record review:  Previous records obtained and reviewed  Sutures removed successfully at bedside.  There is no evidence of secondary infection.  He feels that he is at his baseline.  He has  no headaches, neurologic changes.  He is ambulatory.  Vital signs reviewed and are stable. No HA. No symptoms described concerning for concussion.   Instructed patient on ongoing wound care if needed.  The patient improved significantly and was discharged in stable condition. Detailed discussions were had with the patient regarding current findings, and need for close f/u with PCP or on call doctor. The patient has been instructed to return immediately if the symptoms worsen in any way for re-evaluation. Patient verbalized understanding and is in agreement with current care plan. All questions answered prior to discharge.        This chart was dictated using voice recognition software.  Despite best efforts to proofread,  errors can occur which can change the documentation meaning.         Final Clinical Impression(s) / ED Diagnoses Final diagnoses:  Visit for suture removal    Rx / DC Orders ED Discharge Orders     None         Jeanell Sparrow, DO 12/19/21 1024

## 2021-12-19 NOTE — Discharge Instructions (Addendum)
It was a pleasure caring for you today in the emergency department. ° °Please return to the emergency department for any worsening or worrisome symptoms. ° ° °

## 2021-12-19 NOTE — ED Triage Notes (Signed)
Pt is here to have stitches removed from head.  Suture line appears well healed and approximated, pt has no complaints

## 2021-12-22 ENCOUNTER — Telehealth: Payer: Self-pay | Admitting: General Practice

## 2021-12-22 NOTE — Telephone Encounter (Signed)
Transition Care Management Follow-up Telephone Call Date of discharge and from where: 12/19/21 from [facility] How have you been since you were released from the hospital? Doing good Any questions or concerns? No  Items Reviewed: Did the pt receive and understand the discharge instructions provided? Yes  Medications obtained and verified? No  Other? No  Any new allergies since your discharge? No  Dietary orders reviewed? Yes Do you have support at home? Yes   Home Care and Equipment/Supplies: Were home health services ordered? no  Functional Questionnaire: (I = Independent and D = Dependent) ADLs: I  Bathing/Dressing- I  Meal Prep- I  Eating- I  Maintaining continence- I  Transferring/Ambulation- I  Managing Meds- I  Follow up appointments reviewed:  PCP Hospital f/u appt confirmed? No   Specialist Hospital f/u appt confirmed? No   Are transportation arrangements needed? No  If their condition worsens, is the pt aware to call PCP or go to the Emergency Dept.? Yes Was the patient provided with contact information for the PCP's office or ED? Yes Was to pt encouraged to call back with questions or concerns? Yes

## 2021-12-23 ENCOUNTER — Other Ambulatory Visit: Payer: Self-pay

## 2021-12-23 ENCOUNTER — Encounter: Payer: Self-pay | Admitting: Family Medicine

## 2021-12-23 ENCOUNTER — Ambulatory Visit (INDEPENDENT_AMBULATORY_CARE_PROVIDER_SITE_OTHER): Payer: BC Managed Care – PPO | Admitting: Family Medicine

## 2021-12-23 VITALS — BP 133/73 | HR 103 | Temp 98.4°F | Ht 65.0 in | Wt 206.0 lb

## 2021-12-23 DIAGNOSIS — R051 Acute cough: Secondary | ICD-10-CM

## 2021-12-23 DIAGNOSIS — E1159 Type 2 diabetes mellitus with other circulatory complications: Secondary | ICD-10-CM

## 2021-12-23 DIAGNOSIS — I152 Hypertension secondary to endocrine disorders: Secondary | ICD-10-CM

## 2021-12-23 DIAGNOSIS — I3139 Other pericardial effusion (noninflammatory): Secondary | ICD-10-CM | POA: Diagnosis not present

## 2021-12-23 DIAGNOSIS — E118 Type 2 diabetes mellitus with unspecified complications: Secondary | ICD-10-CM | POA: Diagnosis not present

## 2021-12-23 LAB — POCT GLYCOSYLATED HEMOGLOBIN (HGB A1C): HbA1c, POC (controlled diabetic range): 10.7 % — AB (ref 0.0–7.0)

## 2021-12-23 MED ORDER — DAPAGLIFLOZIN PROPANEDIOL 10 MG PO TABS: 10.0000 mg | ORAL_TABLET | Freq: Every day | ORAL | 1 refills | Status: DC

## 2021-12-23 NOTE — Patient Instructions (Signed)
Let's try restarting farxiga.  If this is too expensive with discount let me know.

## 2021-12-23 NOTE — Assessment & Plan Note (Signed)
Blood pressure remains well controlled.  Recommend continuation of current medications for management of his hypertension.

## 2021-12-23 NOTE — Assessment & Plan Note (Signed)
Diabetes is not well controlled at this time.  Sounds like his most recent sugars are improved compared to sugars a few weeks ago.  Encouraged to work on dietary change.  Adding Farxiga back on, printed coupon out for him to try.  If this remains cost prohibitive we can look at alternative options.  I do think he may benefit from addition of GLP-1 in the future as well.

## 2021-12-23 NOTE — Progress Notes (Signed)
Jordan Taylor - 56 y.o. male MRN [id number]  Date of birth: Dec 24, 1965  Subjective Chief Complaint  Patient presents with   Diabetes   Fall    HPI Jordan Taylor is a 56 year old male here today for follow-up visit.  Reports that overall he is doing okay.  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  This resulted in a laceration to the back of his scalp.  He was seen in the ED and had this repaired.  No problems since then.  He has followed up with cardiologist and pericardial effusion has resolved at this point.   A1c is elevated today.  He is not taking [place] due to cost.  He has not looked into trying a coupon for this.  Blood sugars during November and December were significantly elevated however seem to be improving at this point based on home readings.  Blood pressure well controlled at this time.  Doing well with current medications.  Denies chest pain, shortness of breath, palpitations, headaches or vision changes.  ROS:  A comprehensive ROS was completed and negative except as noted per HPI  Allergies  Allergen Reactions   Atorvastatin Other (See Comments)    Severe muscle pain/Weakness   Penicillins Swelling    Past Medical History:  Diagnosis Date   Essential hypertension 12/04/2013   Hyperlipidemia    Type 2 diabetes mellitus (HCC) 12/04/2013    Past Surgical History:  Procedure Laterality Date   ANTERIOR CERVICAL DECOMPRESSION/DISCECTOMY FUSION 4 LEVELS N/A 09/07/2016   Procedure: Anterior Cervical Diskectomy Fusion - Cervical three- Cervical four - Cervical four - Cervical five - Cervical five- Cervical six  - Cervical six - Cervical seven;  Surgeon: Julio Sicks, MD;  Location: [facility] OR;  Service: Neurosurgery;  Laterality: N/A;   CERVICAL SPINE SURGERY N/A    KNEE ARTHROSCOPY Left    spinal cord surgery     20+ yrs ago    Social History   Socioeconomic History   Marital status: Divorced    Spouse name: [name]   Number of  children: 5   Years of education: College   Highest education level: Not on file  Occupational History   Occupation: Clinical cytogeneticist: Artist  Tobacco Use   Smoking status: Former   Smokeless tobacco: Never  Building services engineer Use: Never used  Substance and Sexual Activity   Alcohol use: No    Alcohol/week: 0.0 standard drinks   Drug use: No    Comment: use CBD   Sexual activity: Yes    Partners: Female  Other Topics Concern   Not on file  Social History Narrative   Lives at home w/ his wife   Right-handed   Drinks about 10 cans of soda per day      Engineer, water.    Social Determinants of Health   Financial Resource Strain: Not on file  Food Insecurity: Not on file  Transportation Needs: Not on file  Physical Activity: Not on file  Stress: Not on file  Social Connections: Not on file    Family History  Problem Relation Age of Onset   Alcohol abuse Mother    Cancer Mother    Alcohol abuse Father    Diabetes Father    Hypertension Father    Cancer Sister    Cancer Other     Health Maintenance  Topic Date Due   COVID-19 Vaccine (3 - Booster for  Moderna series) 05/26/2020   Zoster Vaccines- Shingrix (1 of 2) 02/12/2022 (Originally 04/22/2014)   OPHTHALMOLOGY EXAM  02/24/2022   HEMOGLOBIN A1C  06/22/2022   FOOT EXAM  12/23/2022   Fecal DNA (Cologuard)  05/14/2023   TETANUS/TDAP  12/13/2031   INFLUENZA VACCINE  Completed   Hepatitis C Screening  Completed   HIV Screening  Completed   HPV VACCINES  Aged Out     ----------------------------------------------------------------------------------------------------------------------------------------------------------------------------------------------------------------- Physical Exam BP 133/73 (BP Location: Left Arm, Patient Position: Sitting, Cuff Size: Normal)    Pulse (!) 103    Temp 98.4 F (36.9 C)    Ht 5\' 5"  (1.651 m)    Wt 206 lb (93.4 kg)    SpO2 100%    BMI  34.28 kg/m   Physical Exam Constitutional:      Appearance: Normal appearance.  Eyes:     General: No scleral icterus. Cardiovascular:     Rate and Rhythm: Normal rate and regular rhythm.  Pulmonary:     Effort: Pulmonary effort is normal.     Breath sounds: Normal breath sounds.  Musculoskeletal:     Cervical back: Neck supple.  Neurological:     Mental Status: He is alert.  Psychiatric:        Mood and Affect: Mood normal.        Behavior: Behavior normal.    ------------------------------------------------------------------------------------------------------------------------------------------------------------------------------------------------------------------- Assessment and Plan  Hypertension associated with diabetes (HCC) Blood pressure remains well controlled.  Recommend continuation of current medications for management of his hypertension.  Type 2 diabetes mellitus with unspecified complications (HCC) Diabetes is not well controlled at this time.  Sounds like his most recent sugars are improved compared to sugars a few weeks ago.  Encouraged to work on dietary change.  Adding Farxiga back on, printed coupon out for him to try.  If this remains cost prohibitive we can look at alternative options.  I do think he may benefit from addition of GLP-1 in the future as well.  Pericardial effusion Followed by cardiology.  For last visit this has essentially resolved.   Meds ordered this encounter  Medications   dapagliflozin propanediol (FARXIGA) 10 MG TABS tablet    Sig: Take 1 tablet (10 mg total) by mouth daily.    Dispense:  90 tablet    Refill:  1    Return in about 3 months (around 03/23/2022) for HTN/T2DM.    This visit occurred during the SARS-CoV-2 public health emergency.  Safety protocols were in place, including screening questions prior to the visit, additional usage of staff PPE, and extensive cleaning of exam room while observing appropriate contact  time as indicated for disinfecting solutions.

## 2021-12-23 NOTE — Assessment & Plan Note (Signed)
Followed by cardiology.  For last visit this has essentially resolved.

## 2022-01-15 DIAGNOSIS — E1142 Type 2 diabetes mellitus with diabetic polyneuropathy: Secondary | ICD-10-CM | POA: Diagnosis not present

## 2022-01-31 ENCOUNTER — Other Ambulatory Visit: Payer: Self-pay | Admitting: Family Medicine

## 2022-01-31 ENCOUNTER — Encounter: Payer: Self-pay | Admitting: Family Medicine

## 2022-02-05 ENCOUNTER — Other Ambulatory Visit: Payer: Self-pay | Admitting: Internal Medicine

## 2022-02-05 DIAGNOSIS — I429 Cardiomyopathy, unspecified: Secondary | ICD-10-CM | POA: Diagnosis not present

## 2022-02-05 DIAGNOSIS — E1142 Type 2 diabetes mellitus with diabetic polyneuropathy: Secondary | ICD-10-CM | POA: Diagnosis not present

## 2022-02-05 DIAGNOSIS — N419 Inflammatory disease of prostate, unspecified: Secondary | ICD-10-CM | POA: Diagnosis not present

## 2022-02-05 DIAGNOSIS — E559 Vitamin D deficiency, unspecified: Secondary | ICD-10-CM | POA: Diagnosis not present

## 2022-02-05 DIAGNOSIS — N401 Enlarged prostate with lower urinary tract symptoms: Secondary | ICD-10-CM | POA: Diagnosis not present

## 2022-02-05 DIAGNOSIS — I10 Essential (primary) hypertension: Secondary | ICD-10-CM | POA: Diagnosis not present

## 2022-02-06 LAB — CBC
HCT: 32.3 % — ABNORMAL LOW (ref 38.5–50.0)
Hemoglobin: 11 g/dL — ABNORMAL LOW (ref 13.2–17.1)
MCH: 29.7 pg (ref 27.0–33.0)
MCHC: 34.1 g/dL (ref 32.0–36.0)
MCV: 87.3 fL (ref 80.0–100.0)
MPV: 10.2 fL (ref 7.5–12.5)
Platelets: 269 10*3/uL (ref 140–400)
RBC: 3.7 10*6/uL — ABNORMAL LOW (ref 4.20–5.80)
RDW: 14.3 % (ref 11.0–15.0)
WBC: 8.5 10*3/uL (ref 3.8–10.8)

## 2022-02-06 LAB — COMPLETE METABOLIC PANEL WITH GFR
AG Ratio: 1.4 (calc) (ref 1.0–2.5)
ALT: 62 U/L — ABNORMAL HIGH (ref 9–46)
AST: 39 U/L — ABNORMAL HIGH (ref 10–35)
Albumin: 3.8 g/dL (ref 3.6–5.1)
Alkaline phosphatase (APISO): 184 U/L — ABNORMAL HIGH (ref 35–144)
BUN/Creatinine Ratio: 19 (calc) (ref 6–22)
BUN: 33 mg/dL — ABNORMAL HIGH (ref 7–25)
CO2: 14 mmol/L — ABNORMAL LOW (ref 20–32)
Calcium: 9 mg/dL (ref 8.6–10.3)
Chloride: 112 mmol/L — ABNORMAL HIGH (ref 98–110)
Creat: 1.75 mg/dL — ABNORMAL HIGH (ref 0.70–1.30)
Globulin: 2.7 g/dL (calc) (ref 1.9–3.7)
Glucose, Bld: 167 mg/dL — ABNORMAL HIGH (ref 65–99)
Potassium: 5.9 mmol/L — ABNORMAL HIGH (ref 3.5–5.3)
Sodium: 137 mmol/L (ref 135–146)
Total Bilirubin: 0.4 mg/dL (ref 0.2–1.2)
Total Protein: 6.5 g/dL (ref 6.1–8.1)
eGFR: 45 mL/min/{1.73_m2} — ABNORMAL LOW (ref >=60)

## 2022-02-06 LAB — VITAMIN D 25 HYDROXY (VIT D DEFICIENCY, FRACTURES): Vit D, 25-Hydroxy: 29 ng/mL — ABNORMAL LOW (ref 30–100)

## 2022-02-06 LAB — LIPID PANEL
Cholesterol: 161 mg/dL (ref <200)
HDL: 40 mg/dL (ref >=40)
LDL Cholesterol (Calc): 96 mg/dL (calc)
Non-HDL Cholesterol (Calc): 121 mg/dL (calc) (ref <130)
Total CHOL/HDL Ratio: 4 (calc) (ref <5.0)
Triglycerides: 155 mg/dL — ABNORMAL HIGH (ref <150)

## 2022-02-06 LAB — PSA: PSA: 3.63 ng/mL (ref <=4.00)

## 2022-02-06 LAB — TSH: TSH: 1.03 mIU/L (ref 0.40–4.50)

## 2022-03-23 DIAGNOSIS — E119 Type 2 diabetes mellitus without complications: Secondary | ICD-10-CM | POA: Diagnosis not present

## 2022-03-23 DIAGNOSIS — E1142 Type 2 diabetes mellitus with diabetic polyneuropathy: Secondary | ICD-10-CM | POA: Diagnosis not present

## 2022-03-23 DIAGNOSIS — E668 Other obesity: Secondary | ICD-10-CM | POA: Diagnosis not present

## 2022-03-23 DIAGNOSIS — R7401 Elevation of levels of liver transaminase levels: Secondary | ICD-10-CM | POA: Diagnosis not present

## 2022-03-23 DIAGNOSIS — I10 Essential (primary) hypertension: Secondary | ICD-10-CM | POA: Diagnosis not present

## 2022-03-24 ENCOUNTER — Ambulatory Visit: Payer: BC Managed Care – PPO | Admitting: Family Medicine

## 2022-03-27 DIAGNOSIS — I10 Essential (primary) hypertension: Secondary | ICD-10-CM | POA: Diagnosis not present

## 2022-03-27 DIAGNOSIS — E119 Type 2 diabetes mellitus without complications: Secondary | ICD-10-CM | POA: Diagnosis not present

## 2022-03-27 DIAGNOSIS — R7401 Elevation of levels of liver transaminase levels: Secondary | ICD-10-CM | POA: Diagnosis not present

## 2022-03-27 DIAGNOSIS — N183 Chronic kidney disease, stage 3 unspecified: Secondary | ICD-10-CM | POA: Diagnosis not present

## 2022-04-03 DIAGNOSIS — N183 Chronic kidney disease, stage 3 unspecified: Secondary | ICD-10-CM | POA: Diagnosis not present

## 2022-04-03 DIAGNOSIS — E1142 Type 2 diabetes mellitus with diabetic polyneuropathy: Secondary | ICD-10-CM | POA: Diagnosis not present

## 2022-04-03 DIAGNOSIS — I10 Essential (primary) hypertension: Secondary | ICD-10-CM | POA: Diagnosis not present

## 2022-04-13 DIAGNOSIS — E119 Type 2 diabetes mellitus without complications: Secondary | ICD-10-CM | POA: Diagnosis not present

## 2022-04-13 DIAGNOSIS — N183 Chronic kidney disease, stage 3 unspecified: Secondary | ICD-10-CM | POA: Diagnosis not present

## 2022-04-13 DIAGNOSIS — E1142 Type 2 diabetes mellitus with diabetic polyneuropathy: Secondary | ICD-10-CM | POA: Diagnosis not present

## 2022-04-13 DIAGNOSIS — I10 Essential (primary) hypertension: Secondary | ICD-10-CM | POA: Diagnosis not present

## 2022-05-20 DIAGNOSIS — E1122 Type 2 diabetes mellitus with diabetic chronic kidney disease: Secondary | ICD-10-CM | POA: Diagnosis not present

## 2022-05-20 DIAGNOSIS — E668 Other obesity: Secondary | ICD-10-CM | POA: Diagnosis not present

## 2022-05-20 DIAGNOSIS — N401 Enlarged prostate with lower urinary tract symptoms: Secondary | ICD-10-CM | POA: Diagnosis not present

## 2022-05-20 DIAGNOSIS — E1142 Type 2 diabetes mellitus with diabetic polyneuropathy: Secondary | ICD-10-CM | POA: Diagnosis not present

## 2022-05-20 DIAGNOSIS — I10 Essential (primary) hypertension: Secondary | ICD-10-CM | POA: Diagnosis not present

## 2022-06-03 DIAGNOSIS — E1142 Type 2 diabetes mellitus with diabetic polyneuropathy: Secondary | ICD-10-CM | POA: Diagnosis not present

## 2022-06-15 ENCOUNTER — Other Ambulatory Visit: Payer: Self-pay | Admitting: Internal Medicine

## 2022-06-15 DIAGNOSIS — M5126 Other intervertebral disc displacement, lumbar region: Secondary | ICD-10-CM | POA: Diagnosis not present

## 2022-06-15 DIAGNOSIS — N183 Chronic kidney disease, stage 3 unspecified: Secondary | ICD-10-CM | POA: Diagnosis not present

## 2022-06-15 DIAGNOSIS — I10 Essential (primary) hypertension: Secondary | ICD-10-CM | POA: Diagnosis not present

## 2022-06-15 DIAGNOSIS — E119 Type 2 diabetes mellitus without complications: Secondary | ICD-10-CM | POA: Diagnosis not present

## 2022-06-16 LAB — BASIC METABOLIC PANEL WITH GFR
BUN/Creatinine Ratio: 21 (calc) (ref 6–22)
BUN: 47 mg/dL — ABNORMAL HIGH (ref 7–25)
CO2: 15 mmol/L — ABNORMAL LOW (ref 20–32)
Calcium: 9.2 mg/dL (ref 8.6–10.3)
Chloride: 113 mmol/L — ABNORMAL HIGH (ref 98–110)
Creat: 2.24 mg/dL — ABNORMAL HIGH (ref 0.70–1.30)
Glucose, Bld: 177 mg/dL — ABNORMAL HIGH (ref 65–99)
Potassium: 5.8 mmol/L — ABNORMAL HIGH (ref 3.5–5.3)
Sodium: 135 mmol/L (ref 135–146)
eGFR: 33 mL/min/{1.73_m2} — ABNORMAL LOW (ref >=60)

## 2022-06-16 LAB — EXTRA LAV TOP TUBE

## 2022-07-06 DIAGNOSIS — N1832 Chronic kidney disease, stage 3b: Secondary | ICD-10-CM | POA: Diagnosis not present

## 2022-07-06 DIAGNOSIS — I129 Hypertensive chronic kidney disease with stage 1 through stage 4 chronic kidney disease, or unspecified chronic kidney disease: Secondary | ICD-10-CM | POA: Diagnosis not present

## 2022-07-06 DIAGNOSIS — R809 Proteinuria, unspecified: Secondary | ICD-10-CM | POA: Diagnosis not present

## 2022-07-06 DIAGNOSIS — E1129 Type 2 diabetes mellitus with other diabetic kidney complication: Secondary | ICD-10-CM | POA: Diagnosis not present

## 2022-07-10 ENCOUNTER — Other Ambulatory Visit: Payer: Self-pay | Admitting: Nephrology

## 2022-07-10 DIAGNOSIS — N1832 Chronic kidney disease, stage 3b: Secondary | ICD-10-CM

## 2022-07-17 ENCOUNTER — Ambulatory Visit
Admission: RE | Admit: 2022-07-17 | Discharge: 2022-07-17 | Disposition: A | Discharge status: Home / Self Care | Payer: BC Managed Care – PPO | Source: Ambulatory Visit | Attending: Nephrology | Admitting: Nephrology

## 2022-07-17 DIAGNOSIS — N1832 Chronic kidney disease, stage 3b: Secondary | ICD-10-CM

## 2022-07-17 DIAGNOSIS — N183 Chronic kidney disease, stage 3 unspecified: Secondary | ICD-10-CM | POA: Diagnosis not present

## 2022-07-20 DIAGNOSIS — I10 Essential (primary) hypertension: Secondary | ICD-10-CM | POA: Diagnosis not present

## 2022-07-20 DIAGNOSIS — N183 Chronic kidney disease, stage 3 unspecified: Secondary | ICD-10-CM | POA: Diagnosis not present

## 2022-07-20 DIAGNOSIS — E119 Type 2 diabetes mellitus without complications: Secondary | ICD-10-CM | POA: Diagnosis not present

## 2022-07-20 DIAGNOSIS — M5126 Other intervertebral disc displacement, lumbar region: Secondary | ICD-10-CM | POA: Diagnosis not present

## 2022-08-25 DIAGNOSIS — E1142 Type 2 diabetes mellitus with diabetic polyneuropathy: Secondary | ICD-10-CM | POA: Diagnosis not present

## 2022-08-31 DIAGNOSIS — N5201 Erectile dysfunction due to arterial insufficiency: Secondary | ICD-10-CM | POA: Diagnosis not present

## 2022-08-31 DIAGNOSIS — R351 Nocturia: Secondary | ICD-10-CM | POA: Diagnosis not present

## 2022-08-31 DIAGNOSIS — N401 Enlarged prostate with lower urinary tract symptoms: Secondary | ICD-10-CM | POA: Diagnosis not present

## 2022-11-09 DIAGNOSIS — E1122 Type 2 diabetes mellitus with diabetic chronic kidney disease: Secondary | ICD-10-CM | POA: Diagnosis not present

## 2022-11-09 DIAGNOSIS — N183 Chronic kidney disease, stage 3 unspecified: Secondary | ICD-10-CM | POA: Diagnosis not present

## 2022-11-09 DIAGNOSIS — E1142 Type 2 diabetes mellitus with diabetic polyneuropathy: Secondary | ICD-10-CM | POA: Diagnosis not present

## 2022-11-09 DIAGNOSIS — I10 Essential (primary) hypertension: Secondary | ICD-10-CM | POA: Diagnosis not present

## 2022-11-09 DIAGNOSIS — N401 Enlarged prostate with lower urinary tract symptoms: Secondary | ICD-10-CM | POA: Diagnosis not present

## 2022-12-18 DIAGNOSIS — R809 Proteinuria, unspecified: Secondary | ICD-10-CM | POA: Diagnosis not present

## 2022-12-18 DIAGNOSIS — N1832 Chronic kidney disease, stage 3b: Secondary | ICD-10-CM | POA: Diagnosis not present

## 2022-12-18 DIAGNOSIS — E559 Vitamin D deficiency, unspecified: Secondary | ICD-10-CM | POA: Diagnosis not present

## 2022-12-18 DIAGNOSIS — E1122 Type 2 diabetes mellitus with diabetic chronic kidney disease: Secondary | ICD-10-CM | POA: Diagnosis not present

## 2022-12-18 DIAGNOSIS — I129 Hypertensive chronic kidney disease with stage 1 through stage 4 chronic kidney disease, or unspecified chronic kidney disease: Secondary | ICD-10-CM | POA: Diagnosis not present

## 2023-01-21 DIAGNOSIS — E1142 Type 2 diabetes mellitus with diabetic polyneuropathy: Secondary | ICD-10-CM | POA: Diagnosis not present

## 2023-02-15 DIAGNOSIS — E1142 Type 2 diabetes mellitus with diabetic polyneuropathy: Secondary | ICD-10-CM | POA: Diagnosis not present

## 2023-02-26 DIAGNOSIS — Z125 Encounter for screening for malignant neoplasm of prostate: Secondary | ICD-10-CM | POA: Diagnosis not present

## 2023-02-26 DIAGNOSIS — Z0000 Encounter for general adult medical examination without abnormal findings: Secondary | ICD-10-CM | POA: Diagnosis not present

## 2023-02-26 DIAGNOSIS — E559 Vitamin D deficiency, unspecified: Secondary | ICD-10-CM | POA: Diagnosis not present

## 2023-02-26 DIAGNOSIS — E1122 Type 2 diabetes mellitus with diabetic chronic kidney disease: Secondary | ICD-10-CM | POA: Diagnosis not present

## 2023-02-26 DIAGNOSIS — N183 Chronic kidney disease, stage 3 unspecified: Secondary | ICD-10-CM | POA: Diagnosis not present

## 2023-02-26 DIAGNOSIS — I10 Essential (primary) hypertension: Secondary | ICD-10-CM | POA: Diagnosis not present

## 2023-03-04 DIAGNOSIS — E1142 Type 2 diabetes mellitus with diabetic polyneuropathy: Secondary | ICD-10-CM | POA: Diagnosis not present

## 2023-03-12 DIAGNOSIS — E1122 Type 2 diabetes mellitus with diabetic chronic kidney disease: Secondary | ICD-10-CM | POA: Diagnosis not present

## 2023-03-12 DIAGNOSIS — E1142 Type 2 diabetes mellitus with diabetic polyneuropathy: Secondary | ICD-10-CM | POA: Diagnosis not present

## 2023-03-12 DIAGNOSIS — I10 Essential (primary) hypertension: Secondary | ICD-10-CM | POA: Diagnosis not present

## 2023-03-12 DIAGNOSIS — N183 Chronic kidney disease, stage 3 unspecified: Secondary | ICD-10-CM | POA: Diagnosis not present

## 2023-03-12 DIAGNOSIS — N401 Enlarged prostate with lower urinary tract symptoms: Secondary | ICD-10-CM | POA: Diagnosis not present

## 2023-04-12 DIAGNOSIS — E1142 Type 2 diabetes mellitus with diabetic polyneuropathy: Secondary | ICD-10-CM | POA: Diagnosis not present

## 2023-04-23 DIAGNOSIS — E668 Other obesity: Secondary | ICD-10-CM | POA: Diagnosis not present

## 2023-04-23 DIAGNOSIS — E1122 Type 2 diabetes mellitus with diabetic chronic kidney disease: Secondary | ICD-10-CM | POA: Diagnosis not present

## 2023-04-23 DIAGNOSIS — I10 Essential (primary) hypertension: Secondary | ICD-10-CM | POA: Diagnosis not present

## 2023-04-23 DIAGNOSIS — N183 Chronic kidney disease, stage 3 unspecified: Secondary | ICD-10-CM | POA: Diagnosis not present

## 2023-04-23 DIAGNOSIS — E1142 Type 2 diabetes mellitus with diabetic polyneuropathy: Secondary | ICD-10-CM | POA: Diagnosis not present

## 2023-06-07 DIAGNOSIS — E1122 Type 2 diabetes mellitus with diabetic chronic kidney disease: Secondary | ICD-10-CM | POA: Diagnosis not present

## 2023-07-05 DIAGNOSIS — R809 Proteinuria, unspecified: Secondary | ICD-10-CM | POA: Diagnosis not present

## 2023-07-05 DIAGNOSIS — E1122 Type 2 diabetes mellitus with diabetic chronic kidney disease: Secondary | ICD-10-CM | POA: Diagnosis not present

## 2023-07-05 DIAGNOSIS — I129 Hypertensive chronic kidney disease with stage 1 through stage 4 chronic kidney disease, or unspecified chronic kidney disease: Secondary | ICD-10-CM | POA: Diagnosis not present

## 2023-07-05 DIAGNOSIS — N1832 Chronic kidney disease, stage 3b: Secondary | ICD-10-CM | POA: Diagnosis not present

## 2023-07-28 DIAGNOSIS — R0602 Shortness of breath: Secondary | ICD-10-CM | POA: Diagnosis not present

## 2023-07-28 DIAGNOSIS — I429 Cardiomyopathy, unspecified: Secondary | ICD-10-CM | POA: Diagnosis not present

## 2023-07-28 DIAGNOSIS — E1122 Type 2 diabetes mellitus with diabetic chronic kidney disease: Secondary | ICD-10-CM | POA: Diagnosis not present

## 2023-07-28 DIAGNOSIS — J209 Acute bronchitis, unspecified: Secondary | ICD-10-CM | POA: Diagnosis not present

## 2023-07-29 DIAGNOSIS — I517 Cardiomegaly: Secondary | ICD-10-CM | POA: Diagnosis not present

## 2023-07-29 DIAGNOSIS — R0602 Shortness of breath: Secondary | ICD-10-CM | POA: Diagnosis not present

## 2023-07-30 DIAGNOSIS — E1122 Type 2 diabetes mellitus with diabetic chronic kidney disease: Secondary | ICD-10-CM | POA: Diagnosis not present

## 2023-08-03 ENCOUNTER — Encounter: Payer: Self-pay | Admitting: Adult Health

## 2023-08-03 ENCOUNTER — Ambulatory Visit: Payer: BC Managed Care – PPO | Attending: Adult Health | Admitting: Adult Health

## 2023-08-03 VITALS — BP 132/88 | HR 113 | Ht 65.0 in | Wt 218.2 lb

## 2023-08-03 DIAGNOSIS — I4719 Other supraventricular tachycardia: Secondary | ICD-10-CM

## 2023-08-03 DIAGNOSIS — I5032 Chronic diastolic (congestive) heart failure: Secondary | ICD-10-CM | POA: Diagnosis not present

## 2023-08-03 DIAGNOSIS — I10 Essential (primary) hypertension: Secondary | ICD-10-CM

## 2023-08-03 MED ORDER — METOPROLOL TARTRATE 25 MG PO TABS
25.0000 mg | ORAL_TABLET | Freq: Two times a day (BID) | ORAL | 3 refills | Status: DC
Start: 2023-08-03 — End: 2023-08-07

## 2023-08-03 MED ORDER — AMLODIPINE BESYLATE 5 MG PO TABS: 5.0000 mg | ORAL_TABLET | Freq: Every day | ORAL | 3 refills | Status: DC

## 2023-08-03 NOTE — Progress Notes (Signed)
Cardiology Clinic Note   Patient Name: Jordan Taylor Date of Encounter: 08/03/2023  Primary Care Provider:  Fleet Contras, MD Primary Cardiologist:  Jordan Swaziland, MD  Patient Profile    57 year old male with history of pericardial effusion, hypertension and type II diabetes.  The patient has chronic shortness of breath.  Echocardiogram was ordered when seen last by Dr. Swaziland on 11/11/2021.  The echocardiogram dated 11/26/2021 revealed reduced EF of 40 to 45% with mildly decreased function left ventricle did reveal global hypokinesis.  However the EF was reviewed with a second cardiologist and it was felt that his EF was actually 55 to 60% and not as low as reported.  He was found to have grade 1 diastolic dysfunction.  Small pericardial effusion was present which was circumferential with no evidence of tamponade.  Past Medical History    Past Medical History:  Diagnosis Date   Essential hypertension 12/04/2013   Hyperlipidemia    Type 2 diabetes mellitus (HCC) 12/04/2013   Past Surgical History:  Procedure Laterality Date   ANTERIOR CERVICAL DECOMPRESSION/DISCECTOMY FUSION 4 LEVELS N/A 09/07/2016   Procedure: Anterior Cervical Diskectomy Fusion - Cervical three- Cervical four - Cervical four - Cervical five - Cervical five- Cervical six  - Cervical six - Cervical seven;  Surgeon: Julio Sicks, MD;  Location: [facility] OR;  Service: Neurosurgery;  Laterality: N/A;   CERVICAL SPINE SURGERY N/A    KNEE ARTHROSCOPY Left    spinal cord surgery     20+ yrs ago    Allergies  Allergies  Allergen Reactions   Atorvastatin Other (See Comments)    Severe muscle pain/Weakness   Penicillins Swelling    History of Present Illness    Jordan Taylor comes today for ongoing assessment and management of chronic dyspnea on exertion and heart racing.  He comes today tachypneic, fidgety, with feelings of chest pressure.  He feels his heart racing.  He stated he saw his PCP as he was having trouble  breathing and was placed on a Ventolin inhaler which she has been using quite a bit.  He states that it did not help with his breathing at all.  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  He admits to being under a lot of stress.  He denies use of any significant stimulants, he does not smoke, no illicit drug use, no decongestants such as pseudoephedrine, he does drink a couple coffee in the morning and had a cola around lunchtime.  His TSH was recently checked in 2023 and was normal.  He denies any insomnia.  Home Medications    Current Outpatient Medications  Medication Sig Dispense Refill   albuterol (VENTOLIN HFA) 108 (90 Base) MCG/ACT inhaler Inhale 2 puffs into the lungs every 6 (six) hours as needed for wheezing. 2 each 11   amLODipine (NORVASC) 5 MG tablet Take 1 tablet (5 mg total) by mouth daily. 90 tablet 3   benazepril (LOTENSIN) 20 MG tablet TAKE ONE TABLET BY MOUTH DAILY 90 tablet 2   empagliflozin (JARDIANCE) 10 MG TABS tablet Take 10 mg by mouth daily.     Fluticasone Propionate, Inhal, (FLOVENT DISKUS) 50 MCG/ACT AEPB Inhale 1 puff into the lungs daily. 1 each 1   metoprolol tartrate (LOPRESSOR) 25 MG tablet Take 1 tablet (25 mg total) by mouth 2 (two) times daily. 180 tablet 3   tamsulosin (FLOMAX)  0.4 MG CAPS capsule TAKE ONE CAPSULE BY MOUTH DAILY AFTER BREAKFAST 30 capsule 2   No current facility-administered medications for this visit.     Family History    Family History  Problem Relation Age of Onset   Alcohol abuse Mother    Cancer Mother    Alcohol abuse Father    Diabetes Father    Hypertension Father    Cancer Sister    Cancer Other    He indicated that his mother is deceased. He indicated that his father is deceased. He indicated that only one of his two sisters is alive. He indicated that his brother is alive. He indicated that the status of his other  is unknown.  Social History    Social History   Socioeconomic History   Marital status: Divorced    Spouse name: [name]   Number of children: 5   Years of education: College   Highest education level: Not on file  Occupational History   Occupation: Clinical cytogeneticist: Artist  Tobacco Use   Smoking status: Former   Smokeless tobacco: Never  Advertising account planner   Vaping status: Never Used  Substance and Sexual Activity   Alcohol use: No    Alcohol/week: 0.0 standard drinks of alcohol   Drug use: No    Comment: use CBD   Sexual activity: Yes    Partners: Female  Other Topics Concern   Not on file  Social History Narrative   Lives at home w/ his wife   Right-handed   Drinks about 10 cans of soda per day      Engineer, water.    Social Determinants of Health   Financial Resource Strain: Not on file  Food Insecurity: Not on file  Transportation Needs: Not on file  Physical Activity: Not on file  Stress: Not on file  Social Connections: Unknown (07/29/2023)   Received from [facility]   Social Network    Social Network: Not on file  Intimate Partner Violence: Unknown (07/29/2023)   Received from [facility]   HITS    Physically Hurt: Not on file    Insult or Talk Down To: Not on file    Threaten Physical Harm: Not on file    Scream or Curse: Not on file     Review of Systems    General:  No chills, fever, night sweats or weight changes.  Cardiovascular: Positive for chest pressure, positive for dyspnea on exertion, and at rest, edema, orthopnea, palpitations, paroxysmal nocturnal dyspnea. Dermatological: No rash, lesions/masses Respiratory: No cough, resting dyspnea Urologic: No hematuria, dysuria Abdominal:   No nausea, vomiting, diarrhea, bright red blood per rectum, melena, or hematemesis Neurologic:  No visual changes, wkns, changes in mental status. All other systems reviewed and are otherwise negative except as noted above.        Physical Exam    VS:  BP 132/88   Pulse (!) 113   Ht 5\' 5"  (1.651 m)   Wt 218 lb 3.2 oz (99 kg)   SpO2 95%   BMI 36.31 kg/m  , BMI Body mass index is 36.31 kg/m.     GEN: Well nourished, well developed, in no acute distress. HEENT: normal. Neck: Supple, no JVD, carotid bruits, or masses. Cardiac: RRR, tachycardic no murmurs, rubs, or gallops. No clubbing, cyanosis, edema.  Radials/DP/PT 2+ and equal bilaterally.  Respiratory:  Respirations regular and unlabored, clear to auscultation bilaterally.  Tachypneic GI: Soft, nontender,  nondistended, BS + x 4. MS: no deformity or atrophy. Skin: warm and dry, no rash. Neuro:  Strength and sensation are intact.  Shaking, tremors. Psych: Normal affect.      Lab Results  Component Value Date   WBC 8.5 02/05/2022   HGB 11.0 (L) 02/05/2022   HCT 32.3 (L) 02/05/2022   MCV 87.3 02/05/2022   PLT 269 02/05/2022   Lab Results  Component Value Date   CREATININE 2.24 (H) 06/15/2022   BUN 47 (H) 06/15/2022   NA 135 06/15/2022   K 5.8 (H) 06/15/2022   CL 113 (H) 06/15/2022   CO2 15 (L) 06/15/2022   Lab Results  Component Value Date   ALT 62 (H) 02/05/2022   AST 39 (H) 02/05/2022   ALKPHOS 109 01/28/2015   BILITOT 0.4 02/05/2022   Lab Results  Component Value Date   CHOL 161 02/05/2022   HDL 40 02/05/2022   LDLCALC 96 02/05/2022   TRIG 155 (H) 02/05/2022   CHOLHDL 4.0 02/05/2022    Lab Results  Component Value Date   HGBA1C 10.7 (A) 12/23/2021     Review of Prior Studies    Echocardiogram 11/26/2021 1. Left ventricular ejection fraction, by estimation, is 40 to 45%. The  left ventricle has mildly decreased function. The left ventricle  demonstrates global hypokinesis. There is mild concentric left ventricular  hypertrophy. Left ventricular diastolic  parameters are consistent with Grade I diastolic dysfunction (impaired  relaxation).   2. Right ventricular systolic function is normal. The right ventricular  size  is normal.   3. A small pericardial effusion is present. The pericardial effusion is  circumferential. There is no evidence of cardiac tamponade.   4. The mitral valve is normal in structure. No evidence of mitral valve  regurgitation. No evidence of mitral stenosis.   5. The aortic valve is normal in structure. Aortic valve regurgitation is  not visualized. No aortic stenosis is present.   6. The inferior vena cava is normal in size with greater than 50%  respiratory variability, suggesting right atrial pressure of 3 mmHg.    Assessment & Plan   1.  Tachycardia: Heart rate per EKG is 117 bpm he is tachypneic and feeling chest pressure with this.  He denies any stimulants but he did have coffee and cola today.  My plan will be to add metoprolol to tartrate 25 mg twice daily.  Today he may take 12.5 mg (a half a tablet) x 1.  To avoid hypotension he will decrease his amlodipine from 10 mg daily to 5 mg daily and continue benazepril.  On next office visit we will plan to do an echocardiogram and draw some labs to evaluate TSH, for anemia, and kidney function.  He is to avoid cola, caffeine, or any other stimulants.  2.  Hypertension: Currently blood pressure is well-controlled.  However I will decrease amlodipine to 5 mg from 10 mg with addition of metoprolol to tartrate 25 mg twice daily to prevent hypotension.  3.  Dyspnea: Multifactorial he may be having some dyspnea related to tachycardia, but has been using steroid inhalers for breathing which also can increase heart rate.  May need to consider adding Xopenex versus albuterol.         Signed, Bettey Mare. Liborio Nixon, ANP, AACC   08/03/2023 2:32 PM      Office [phone] Fax [phone]  Notice: This dictation was prepared with Dragon dictation along with smaller phrase technology. Any transcriptional errors that  result from this process are unintentional and may not be corrected upon review.

## 2023-08-03 NOTE — Patient Instructions (Signed)
Medication Instructions:  START METOPROLOL TARTRATE 25 MG TWICE A DAY DECREASE AMLODIPINE TO 5 MG DAILY. *If you need a refill on your cardiac medications before your next appointment, please call your pharmacy*   Lab Work: NO LABS If you have labs (blood work) drawn today and your tests are completely normal, you will receive your results only by: MyChart Message (if you have MyChart) OR A paper copy in the mail If you have any lab test that is abnormal or we need to change your treatment, we will call you to review the results.   Testing/Procedures: NO TESTING   Follow-Up: At [facility], you and your health needs are our priority.  As part of our continuing mission to provide you with exceptional heart care, we have created designated Provider Care Teams.  These Care Teams include your primary Cardiologist (physician) and Advanced Practice Providers (APPs -  Physician Assistants and Nurse Practitioners) who all work together to provide you with the care you need, when you need it.    Your next appointment:   2 week(s)  Provider:   Joni Reining, DNP, ANP  Other Instructions CHECK BLOOD PRESSURE DAILY AT THE SAME TIME AND KEEP A RECORD.

## 2023-08-04 ENCOUNTER — Inpatient Hospital Stay (HOSPITAL_COMMUNITY)
Admission: EM | Admit: 2023-08-04 | Discharge: 2023-08-07 | DRG: 286 | Disposition: A | Discharge status: Home / Self Care | Payer: BC Managed Care – PPO | Attending: Internal Medicine | Admitting: Internal Medicine

## 2023-08-04 ENCOUNTER — Other Ambulatory Visit (HOSPITAL_COMMUNITY): Payer: BC Managed Care – PPO

## 2023-08-04 ENCOUNTER — Emergency Department (HOSPITAL_COMMUNITY): Payer: BC Managed Care – PPO

## 2023-08-04 ENCOUNTER — Telehealth: Payer: Self-pay

## 2023-08-04 ENCOUNTER — Encounter (HOSPITAL_COMMUNITY): Payer: Self-pay

## 2023-08-04 DIAGNOSIS — E1165 Type 2 diabetes mellitus with hyperglycemia: Secondary | ICD-10-CM | POA: Diagnosis present

## 2023-08-04 DIAGNOSIS — I428 Other cardiomyopathies: Secondary | ICD-10-CM | POA: Diagnosis present

## 2023-08-04 DIAGNOSIS — Z7951 Long term (current) use of inhaled steroids: Secondary | ICD-10-CM | POA: Diagnosis not present

## 2023-08-04 DIAGNOSIS — N1832 Chronic kidney disease, stage 3b: Secondary | ICD-10-CM | POA: Diagnosis not present

## 2023-08-04 DIAGNOSIS — E1159 Type 2 diabetes mellitus with other circulatory complications: Secondary | ICD-10-CM | POA: Diagnosis present

## 2023-08-04 DIAGNOSIS — J9601 Acute respiratory failure with hypoxia: Secondary | ICD-10-CM | POA: Diagnosis not present

## 2023-08-04 DIAGNOSIS — Z7984 Long term (current) use of oral hypoglycemic drugs: Secondary | ICD-10-CM

## 2023-08-04 DIAGNOSIS — U071 COVID-19: Secondary | ICD-10-CM | POA: Diagnosis not present

## 2023-08-04 DIAGNOSIS — E872 Acidosis, unspecified: Secondary | ICD-10-CM | POA: Diagnosis not present

## 2023-08-04 DIAGNOSIS — E875 Hyperkalemia: Secondary | ICD-10-CM | POA: Diagnosis present

## 2023-08-04 DIAGNOSIS — E1122 Type 2 diabetes mellitus with diabetic chronic kidney disease: Secondary | ICD-10-CM | POA: Diagnosis not present

## 2023-08-04 DIAGNOSIS — M7989 Other specified soft tissue disorders: Secondary | ICD-10-CM | POA: Diagnosis not present

## 2023-08-04 DIAGNOSIS — Z833 Family history of diabetes mellitus: Secondary | ICD-10-CM

## 2023-08-04 DIAGNOSIS — Z6836 Body mass index (BMI) 36.0-36.9, adult: Secondary | ICD-10-CM | POA: Diagnosis not present

## 2023-08-04 DIAGNOSIS — I4510 Unspecified right bundle-branch block: Secondary | ICD-10-CM | POA: Diagnosis present

## 2023-08-04 DIAGNOSIS — Z87891 Personal history of nicotine dependence: Secondary | ICD-10-CM | POA: Diagnosis not present

## 2023-08-04 DIAGNOSIS — I517 Cardiomegaly: Secondary | ICD-10-CM | POA: Diagnosis not present

## 2023-08-04 DIAGNOSIS — Z811 Family history of alcohol abuse and dependence: Secondary | ICD-10-CM

## 2023-08-04 DIAGNOSIS — J810 Acute pulmonary edema: Secondary | ICD-10-CM | POA: Diagnosis not present

## 2023-08-04 DIAGNOSIS — I5031 Acute diastolic (congestive) heart failure: Secondary | ICD-10-CM | POA: Diagnosis not present

## 2023-08-04 DIAGNOSIS — N400 Enlarged prostate without lower urinary tract symptoms: Secondary | ICD-10-CM | POA: Diagnosis present

## 2023-08-04 DIAGNOSIS — I3139 Other pericardial effusion (noninflammatory): Secondary | ICD-10-CM | POA: Diagnosis not present

## 2023-08-04 DIAGNOSIS — Z8249 Family history of ischemic heart disease and other diseases of the circulatory system: Secondary | ICD-10-CM

## 2023-08-04 DIAGNOSIS — Z880 Allergy status to penicillin: Secondary | ICD-10-CM | POA: Diagnosis not present

## 2023-08-04 DIAGNOSIS — E118 Type 2 diabetes mellitus with unspecified complications: Secondary | ICD-10-CM | POA: Diagnosis present

## 2023-08-04 DIAGNOSIS — E785 Hyperlipidemia, unspecified: Secondary | ICD-10-CM | POA: Diagnosis not present

## 2023-08-04 DIAGNOSIS — I5043 Acute on chronic combined systolic (congestive) and diastolic (congestive) heart failure: Secondary | ICD-10-CM | POA: Diagnosis not present

## 2023-08-04 DIAGNOSIS — I5021 Acute systolic (congestive) heart failure: Secondary | ICD-10-CM | POA: Diagnosis present

## 2023-08-04 DIAGNOSIS — Z888 Allergy status to other drugs, medicaments and biological substances status: Secondary | ICD-10-CM

## 2023-08-04 DIAGNOSIS — E1169 Type 2 diabetes mellitus with other specified complication: Secondary | ICD-10-CM | POA: Diagnosis present

## 2023-08-04 DIAGNOSIS — Z79899 Other long term (current) drug therapy: Secondary | ICD-10-CM

## 2023-08-04 DIAGNOSIS — R0602 Shortness of breath: Secondary | ICD-10-CM | POA: Diagnosis not present

## 2023-08-04 DIAGNOSIS — I130 Hypertensive heart and chronic kidney disease with heart failure and stage 1 through stage 4 chronic kidney disease, or unspecified chronic kidney disease: Secondary | ICD-10-CM | POA: Diagnosis not present

## 2023-08-04 DIAGNOSIS — I5022 Chronic systolic (congestive) heart failure: Secondary | ICD-10-CM | POA: Diagnosis not present

## 2023-08-04 DIAGNOSIS — T380X5A Adverse effect of glucocorticoids and synthetic analogues, initial encounter: Secondary | ICD-10-CM | POA: Diagnosis present

## 2023-08-04 LAB — BRAIN NATRIURETIC PEPTIDE: B Natriuretic Peptide: 754.7 pg/mL — ABNORMAL HIGH (ref 0.0–100.0)

## 2023-08-04 LAB — BASIC METABOLIC PANEL
Anion gap: 15 (ref 5–15)
Anion gap: 13 (ref 5–15)
BUN: 29 mg/dL — ABNORMAL HIGH (ref 6–20)
BUN: 34 mg/dL — ABNORMAL HIGH (ref 6–20)
CO2: 17 mmol/L — ABNORMAL LOW (ref 22–32)
CO2: 17 mmol/L — ABNORMAL LOW (ref 22–32)
Calcium: 8.7 mg/dL — ABNORMAL LOW (ref 8.9–10.3)
Calcium: 8.6 mg/dL — ABNORMAL LOW (ref 8.9–10.3)
Chloride: 105 mmol/L (ref 98–111)
Chloride: 105 mmol/L (ref 98–111)
Creatinine, Ser: 1.96 mg/dL — ABNORMAL HIGH (ref 0.61–1.24)
Creatinine, Ser: 2.11 mg/dL — ABNORMAL HIGH (ref 0.61–1.24)
GFR, Estimated: 39 mL/min — ABNORMAL LOW (ref >60)
GFR, Estimated: 35 mL/min — ABNORMAL LOW (ref >60)
Glucose, Bld: 227 mg/dL — ABNORMAL HIGH (ref 70–99)
Glucose, Bld: 305 mg/dL — ABNORMAL HIGH (ref 70–99)
Potassium: 5.4 mmol/L — ABNORMAL HIGH (ref 3.5–5.1)
Potassium: 4.7 mmol/L (ref 3.5–5.1)
Sodium: 137 mmol/L (ref 135–145)
Sodium: 135 mmol/L (ref 135–145)

## 2023-08-04 LAB — C-REACTIVE PROTEIN: CRP: 1.9 mg/dL — ABNORMAL HIGH (ref <1.0)

## 2023-08-04 LAB — D-DIMER, QUANTITATIVE: D-Dimer, Quant: 1.13 ug{FEU}/mL — ABNORMAL HIGH (ref 0.00–0.50)

## 2023-08-04 LAB — I-STAT VENOUS BLOOD GAS, ED
Acid-base deficit: 7 mmol/L — ABNORMAL HIGH (ref 0.0–2.0)
Bicarbonate: 19.1 mmol/L — ABNORMAL LOW (ref 20.0–28.0)
Calcium, Ion: 1.16 mmol/L (ref 1.15–1.40)
HCT: 44 % (ref 39.0–52.0)
Hemoglobin: 15 g/dL (ref 13.0–17.0)
O2 Saturation: 36 %
Potassium: 5.4 mmol/L — ABNORMAL HIGH (ref 3.5–5.1)
Sodium: 137 mmol/L (ref 135–145)
TCO2: 20 mmol/L — ABNORMAL LOW (ref 22–32)
pCO2, Ven: 37.9 mmHg — ABNORMAL LOW (ref 44–60)
pH, Ven: 7.311 (ref 7.25–7.43)
pO2, Ven: 23 mmHg — CL (ref 32–45)

## 2023-08-04 LAB — CBC
HCT: 43 % (ref 39.0–52.0)
Hemoglobin: 14.2 g/dL (ref 13.0–17.0)
MCH: 29 pg (ref 26.0–34.0)
MCHC: 33 g/dL (ref 30.0–36.0)
MCV: 87.9 fL (ref 80.0–100.0)
Platelets: 435 10*3/uL — ABNORMAL HIGH (ref 150–400)
RBC: 4.89 MIL/uL (ref 4.22–5.81)
RDW: 16.2 % — ABNORMAL HIGH (ref 11.5–15.5)
WBC: 20.6 10*3/uL — ABNORMAL HIGH (ref 4.0–10.5)
nRBC: 0.1 % (ref 0.0–0.2)

## 2023-08-04 LAB — SARS CORONAVIRUS 2 BY RT PCR: SARS Coronavirus 2 by RT PCR: POSITIVE — AB

## 2023-08-04 LAB — GLUCOSE, CAPILLARY
Glucose-Capillary: 225 mg/dL — ABNORMAL HIGH (ref 70–99)
Glucose-Capillary: 441 mg/dL — ABNORMAL HIGH (ref 70–99)

## 2023-08-04 LAB — HIV ANTIBODY (ROUTINE TESTING W REFLEX): HIV Screen 4th Generation wRfx: NONREACTIVE

## 2023-08-04 LAB — TROPONIN I (HIGH SENSITIVITY): Troponin I (High Sensitivity): 41 ng/L — ABNORMAL HIGH (ref <18)

## 2023-08-04 LAB — HEMOGLOBIN A1C
Hgb A1c MFr Bld: 7.6 % — ABNORMAL HIGH (ref 4.8–5.6)
Mean Plasma Glucose: 171.42 mg/dL

## 2023-08-04 LAB — CBG MONITORING, ED: Glucose-Capillary: 277 mg/dL — ABNORMAL HIGH (ref 70–99)

## 2023-08-04 LAB — PROCALCITONIN: Procalcitonin: 0.19 ng/mL

## 2023-08-04 MED ORDER — FUROSEMIDE 10 MG/ML IJ SOLN
60.0000 mg | Freq: Two times a day (BID) | INTRAMUSCULAR | Status: DC
  Administered 2023-08-04 (×2): 60 mg via INTRAVENOUS
  Filled 2023-08-04 (×2): qty 6

## 2023-08-04 MED ORDER — UMECLIDINIUM BROMIDE 62.5 MCG/ACT IN AEPB
1.0000 | INHALATION_SPRAY | Freq: Every day | RESPIRATORY_TRACT | Status: DC
  Filled 2023-08-04: qty 7

## 2023-08-04 MED ORDER — SODIUM ZIRCONIUM CYCLOSILICATE 10 G PO PACK
10.0000 g | PACK | Freq: Once | ORAL | Status: AC
  Administered 2023-08-04: 10 g via ORAL
  Filled 2023-08-04: qty 1

## 2023-08-04 MED ORDER — SODIUM BICARBONATE 650 MG PO TABS
650.0000 mg | ORAL_TABLET | Freq: Two times a day (BID) | ORAL | Status: DC
  Administered 2023-08-04 – 2023-08-07 (×7): 650 mg via ORAL
  Filled 2023-08-04 (×7): qty 1

## 2023-08-04 MED ORDER — FUROSEMIDE 10 MG/ML IJ SOLN
40.0000 mg | Freq: Once | INTRAMUSCULAR | Status: AC
  Administered 2023-08-04: 40 mg via INTRAVENOUS
  Filled 2023-08-04: qty 4

## 2023-08-04 MED ORDER — FLUTICASONE FUROATE-VILANTEROL 100-25 MCG/ACT IN AEPB
1.0000 | INHALATION_SPRAY | Freq: Every day | RESPIRATORY_TRACT | Status: DC
  Filled 2023-08-04: qty 28

## 2023-08-04 MED ORDER — INSULIN ASPART 100 UNIT/ML IJ SOLN
0.0000 [IU] | Freq: Three times a day (TID) | INTRAMUSCULAR | Status: DC
  Administered 2023-08-04: 5 [IU] via SUBCUTANEOUS
  Administered 2023-08-04 – 2023-08-05 (×2): 3 [IU] via SUBCUTANEOUS
  Administered 2023-08-05: 7 [IU] via SUBCUTANEOUS
  Administered 2023-08-05: 5 [IU] via SUBCUTANEOUS
  Administered 2023-08-06: 1 [IU] via SUBCUTANEOUS
  Administered 2023-08-06: 3 [IU] via SUBCUTANEOUS
  Administered 2023-08-07: 2 [IU] via SUBCUTANEOUS

## 2023-08-04 MED ORDER — METHYLPREDNISOLONE SODIUM SUCC 125 MG IJ SOLR
80.0000 mg | Freq: Every day | INTRAMUSCULAR | Status: DC
  Administered 2023-08-04 – 2023-08-05 (×2): 80 mg via INTRAVENOUS
  Filled 2023-08-04 (×2): qty 2

## 2023-08-04 MED ORDER — ALBUTEROL SULFATE (2.5 MG/3ML) 0.083% IN NEBU: 3.0000 mL | INHALATION_SOLUTION | Freq: Four times a day (QID) | RESPIRATORY_TRACT | Status: DC | PRN

## 2023-08-04 MED ORDER — FLUTICASONE PROPIONATE (INHAL) 50 MCG/ACT IN AEPB: 1.0000 | INHALATION_SPRAY | Freq: Every day | RESPIRATORY_TRACT | Status: DC

## 2023-08-04 MED ORDER — HEPARIN SODIUM (PORCINE) 5000 UNIT/ML IJ SOLN
5000.0000 [IU] | Freq: Three times a day (TID) | INTRAMUSCULAR | Status: DC
  Administered 2023-08-05: 5000 [IU] via SUBCUTANEOUS
  Filled 2023-08-04: qty 1

## 2023-08-04 MED ORDER — TAMSULOSIN HCL 0.4 MG PO CAPS
0.4000 mg | ORAL_CAPSULE | Freq: Every day | ORAL | Status: DC
  Administered 2023-08-04 – 2023-08-07 (×4): 0.4 mg via ORAL
  Filled 2023-08-04 (×4): qty 1

## 2023-08-04 NOTE — ED Triage Notes (Signed)
Pt is coming in with a significant exacerbation of SHOB of breath that has progressively worsened since taking a new medication his doctor started him on today for his heart rate being too high. He states he took the medication at 3pm, and since the his [facility] has progressively worsened and it has been accompanied by some chest pain. Pt has a moderate level of anxiety associated with this. He reports no significant heart or lung history while in triage and can complete sentences. Upon arrival he had 81% spo2 on RA and with 4l [place] and coaching on his breathing he has reached and SpO2 of 92%

## 2023-08-04 NOTE — Progress Notes (Signed)
Patient to [facility] from the ed. Patient is alert and oriented x4. Mae's x4. Patient denies complaints. MP shows stach. Patient is on 8 liters hi-flo nasal cannula. Patient pleasant no cough noted and patent denies shortness of breath. Patient oriented to room bed call bell and belongings policy. Patient denies meds with him.

## 2023-08-04 NOTE — H&P (Signed)
History and Physical    Jordan Taylor FUX:[id number] DOB: 10-29-1966 DOA: 08/04/2023  PCP: Fleet Contras, MD   Patient coming from: Home    Chief Complaint:   HPI: Jordan Taylor is a 57 y.o. male with medical history significant of hypertension, diabetes type 2, hyperlipidemia, systolic congestive heart failure with EF of 40 to 45%  as per echo on 10/2021 following with cardiology., CKD stage IIIb with baseline creatinine of 1.72 -2, presented with complaint of shortness of breath from home.  Dyspnea  has been ongoing for weeks and he recently saw his primary care physician and cardiology.  He was recently started on beta-blocker.  He felt worsening shortness of breath so he presented to the emergency department today.  Patient works as a Investment banker, operational . On presentation, COVID screen test came out to be positive, chest x-ray showed signs of acute pulmonary edema, he was found to be hypoxic.  Initially placed on 4 L of oxygen and on BiPAP.  Lab work showed leukocytosis in the range of 20,000.  Troponins was mildly  elevated.  After putting on BiPAP, he felt much better and tachypnea resolved.  Also given a dose of Lasix 40 mg once.  Patient admitted for the management of possible acute on chronic congestive heart failure, COVID illness. Patient seen and examined the bedside this morning.  His partner was at bedside too.  He feels much better after putting on BiPAP and denies shortness of breath or cough.  No history of fever, chills, leg edema, abdominal pain, nausea, vomiting, diarrhea or chest pain.  He does not smoke   Review of Systems: As per HPI otherwise 10 point review of systems negative.    Past Medical History:  Diagnosis Date   Essential hypertension 12/04/2013   Hyperlipidemia    Type 2 diabetes mellitus (HCC) 12/04/2013    Past Surgical History:  Procedure Laterality Date   ANTERIOR CERVICAL DECOMPRESSION/DISCECTOMY FUSION 4 LEVELS N/A 09/07/2016   Procedure: Anterior  Cervical Diskectomy Fusion - Cervical three- Cervical four - Cervical four - Cervical five - Cervical five- Cervical six  - Cervical six - Cervical seven;  Surgeon: Julio Sicks, MD;  Location: [facility] OR;  Service: Neurosurgery;  Laterality: N/A;   CERVICAL SPINE SURGERY N/A    KNEE ARTHROSCOPY Left    spinal cord surgery     20+ yrs ago     reports that he has quit smoking. He has never used smokeless tobacco. He reports that he does not drink alcohol and does not use drugs.  Allergies  Allergen Reactions   Amoxicillin Swelling   Atorvastatin Other (See Comments)    Severe muscle pain/Weakness   Penicillins Swelling    Family History  Problem Relation Age of Onset   Alcohol abuse Mother    Cancer Mother    Alcohol abuse Father    Diabetes Father    Hypertension Father    Cancer Sister    Cancer Other      Prior to Admission medications   Medication Sig Start Date End Date Taking? Authorizing Provider  albuterol (VENTOLIN HFA) 108 (90 Base) MCG/ACT inhaler Inhale 2 puffs into the lungs every 6 (six) hours as needed for wheezing. 11/10/21  Yes [name] B, NP  amLODipine (NORVASC) 5 MG tablet Take 1 tablet (5 mg total) by mouth daily. 08/03/23 11/01/23 Yes Jodelle Gross, NP  benazepril (LOTENSIN) 20 MG tablet TAKE ONE TABLET BY MOUTH DAILY 09/29/21  Yes Everrett Coombe, DO  empagliflozin (JARDIANCE)  10 MG TABS tablet Take 10 mg by mouth daily.   Yes [provider]  Fluticasone Propionate, Inhal, (FLOVENT DISKUS) 50 MCG/ACT AEPB Inhale 1 puff into the lungs daily. 11/10/21  Yes Clayborne Dana, NP  metoprolol tartrate (LOPRESSOR) 25 MG tablet Take 1 tablet (25 mg total) by mouth 2 (two) times daily. 08/03/23 11/01/23 Yes Jodelle Gross, NP  tamsulosin (FLOMAX) 0.4 MG CAPS capsule TAKE ONE CAPSULE BY MOUTH DAILY AFTER BREAKFAST 07/08/21  Yes Matthews, Cody, DO  TRELEGY ELLIPTA 100-62.5-25 MCG/ACT AEPB Inhale 1 puff into the lungs daily. 07/30/23  Yes [provider]   TRESIBA FLEXTOUCH 100 UNIT/ML FlexTouch Pen Inject 15 Units into the skin daily. 07/03/23  Yes [provider]    Physical Exam: Vitals:   08/04/23 0615 08/04/23 0630 08/04/23 0645 08/04/23 0722  BP: 123/87 115/82 116/78   Pulse: 94 94 95   Resp: (!) 24 (!) 22 (!) 23   Temp:    97.8 F (36.6 C)  TempSrc:    Axillary  SpO2: 97% 96% 97%     Constitutional: Overall comfortable,obese Vitals:   08/04/23 0615 08/04/23 0630 08/04/23 0645 08/04/23 0722  BP: 123/87 115/82 116/78   Pulse: 94 94 95   Resp: (!) 24 (!) 22 (!) 23   Temp:    97.8 F (36.6 C)  TempSrc:    Axillary  SpO2: 97% 96% 97%    Eyes: PERRL, lids and conjunctivae normal ENMT: Mucous membranes are moist.  Neck: normal, supple, no masses, no thyromegaly Respiratory:Normal respiratory effort. No accessory muscle use.  Diminished air sounds on bases, few crackles heard Cardiovascular: Regular rate and rhythm, no murmurs / rubs / gallops. No extremity edema.  Abdomen: no tenderness, no masses palpated. No hepatosplenomegaly. Bowel sounds positive.  Musculoskeletal: no clubbing / cyanosis. No joint deformity upper and lower extremities.  Skin: no rashes, lesions, ulcers. No induration Neurologic: CN 2-12 grossly intact.  Strength 5/5 in all 4.  Psychiatric: Normal judgment and insight. Alert and oriented x 3. Normal mood.   Foley Catheter:None  Labs on Admission: I have personally reviewed following labs and imaging studies  CBC: Recent Labs  Lab 08/04/23 0327 08/04/23 0333  WBC 20.6*  --   HGB 14.2 15.0  HCT 43.0 44.0  MCV 87.9  --   PLT 435*  --    Basic Metabolic Panel: Recent Labs  Lab 08/04/23 0327 08/04/23 0333  NA 137 137  K 5.4* 5.4*  CL 105  --   CO2 17*  --   GLUCOSE 227*  --   BUN 29*  --   CREATININE 1.96*  --   CALCIUM 8.7*  --    GFR: Estimated Creatinine Clearance: 43.9 mL/min (A) (by C-G formula based on SCr of 1.96 mg/dL (H)). Liver Function Tests: No results for  input(s): "AST", "ALT", "ALKPHOS", "BILITOT", "PROT", "ALBUMIN" in the last 168 hours. No results for input(s): "LIPASE", "AMYLASE" in the last 168 hours. No results for input(s): "AMMONIA" in the last 168 hours. Coagulation Profile: No results for input(s): "INR", "PROTIME" in the last 168 hours. Cardiac Enzymes: No results for input(s): "CKTOTAL", "CKMB", "CKMBINDEX", "TROPONINI" in the last 168 hours. BNP (last 3 results) No results for input(s): "PROBNP" in the last 8760 hours. HbA1C: No results for input(s): "HGBA1C" in the last 72 hours. CBG: No results for input(s): "GLUCAP" in the last 168 hours. Lipid Profile: No results for input(s): "CHOL", "HDL", "LDLCALC", "TRIG", "CHOLHDL", "LDLDIRECT" in the last 72  hours. Thyroid Function Tests: No results for input(s): "TSH", "T4TOTAL", "FREET4", "T3FREE", "THYROIDAB" in the last 72 hours. Anemia Panel: No results for input(s): "VITAMINB12", "FOLATE", "FERRITIN", "TIBC", "IRON", "RETICCTPCT" in the last 72 hours. Urine analysis:    Component Value Date/Time   COLORURINE COLORLESS (A) 11/11/2021 0138   APPEARANCEUR CLEAR 11/11/2021 0138   LABSPEC 1.025 11/11/2021 0138   PHURINE 5.0 11/11/2021 0138   GLUCOSEU >1,000 (A) 11/11/2021 0138   HGBUR TRACE (A) 11/11/2021 0138   BILIRUBINUR NEGATIVE 11/11/2021 0138   BILIRUBINUR neg 03/20/2019 1329   KETONESUR NEGATIVE 11/11/2021 0138   PROTEINUR TRACE (A) 11/11/2021 0138   UROBILINOGEN 0.2 03/20/2019 1329   NITRITE NEGATIVE 11/11/2021 0138   LEUKOCYTESUR NEGATIVE 11/11/2021 0138    Radiological Exams on Admission: DG Chest Port 1 View  Result Date: 08/04/2023 CLINICAL DATA:  Shortness of breath EXAM: PORTABLE CHEST 1 VIEW COMPARISON:  11/10/2021 FINDINGS: Mild cardiomegaly. Interstitial prominence throughout the lungs, likely interstitial edema. No effusions or acute bony abnormality. IMPRESSION: Cardiomegaly with interstitial prominence, likely interstitial edema. Electronically Signed    By: Charlett Nose M.D.   On: 08/04/2023 03:40     Assessment/Plan Principal Problem:   Acute hypoxic respiratory failure (HCC) Active Problems:   Morbid obesity (HCC)   Type 2 diabetes mellitus with unspecified complications (HCC)   Hypertension associated with diabetes (HCC)   Hyperlipidemia associated with type 2 diabetes mellitus (HCC)   Hyperkalemia   COVID-19 virus infection   Chronic systolic CHF (congestive heart failure) (HCC)  Acute hypoxic respiratory failure: Patient with shortness of breath.  Not on oxygen at home.  Initially on 4 L of oxygen per minute but later on put on BiPAP.  Chest x-ray showed cardiomegaly with interstitial prominence, likely interstitial edema.  Acute hypoxic failure is mostly secondary to combination of CHF exacerbation and COVID.  Continue Lasix IV.  Will liberate him from BiPAP.  Will improving oxygen  COVID infection: Afebrile but presented with shortness of breath.  Due to presence of acute hypoxic respiratory failure, will start on Decadron.  Continue to follow inflammatory markers.  Continue isolation precaution.  Continue albuterol inhaler, incentive spirometer  Combined systolic/diastolic congestive heart failure: Echo on 11/26/2021 showed EF of 40 to 45% but upon reviewing cardiology note, EF was actually 55 to 60% and was found to have grade 1 diastolic dysfunction.  Presented with elevated BNP, dyspnea.  Given a dose of Lasix IV 40 mg once.  Will check echo again.  Continue Lasix 40 mg IV twice daily for now.  Monitor input/output, daily weight.  Low threshold to call cardiology consult if concern for acute congestive heart failure on echo  CKD stage IIIb/hyperkalemia/non-anion gap metabolic acidosis: On reviewing his previous lab works, his creatinine has ranged from 1.7-2.2.  CO2 of 17, potassium 5.4.  Will give a dose of Lokelma, started on sodium bicarb tablets.  Monitor BMP, avoid nephrotoxins  Leukocytosis: This is most likely from COVID  illness.  Will check procalcitonin.  Patient is afebrile.  Will hold antibiotic therapy for now.  Diabetes type 2: Dante Gang, Evaristo Bury at home.  Check A1c level.  Monitor blood sugars, continue sliding scale  insulin for now.  Suspected asthma : He was recently started on  Trelegy, albuterol inhaler by his PCP.  No wheezing at the moment.  Continue steroids, bronchodilators.  Hypertension: Takes amlodipine, benazepril , metoprolol at home.  Currently blood pressure stable.  Continue to hold these medications for now  History of BPH: On  Flomax   Severity of Illness: The appropriate patient status for this patient is INPATIENT.  DVT prophylaxis: Heparin [place] Code Status: Full Family Communication: Discussed with partner at bedside Consults called: None right now     Burnadette Pop MD [facility]  08/04/2023, 8:02 AM

## 2023-08-04 NOTE — ED Provider Notes (Signed)
[place] [facility]ENC[facility] DEPARTMENT AT [facility] Provider Note   CSN: [id number] Arrival date & time: 08/04/23  0300     History  Chief Complaint  Patient presents with   Shortness of Breath    Jordan Taylor is a 57 y.o. male.  57 yo M with a chief complaint of difficulty breathing.  This been ongoing for weeks he had seen his family doctor and then had seen the cardiologist.  At the cardiologist appointment he was noted to be quite tachycardic and so started on a beta-blocker.  He got home this evening and things got quite a bit worse.  He denies any chest pain denies cough congestion or fever.  Denies trauma.  Denies leg swelling.   Shortness of Breath      Home Medications Prior to Admission medications   Medication Sig Start Date End Date Taking? Authorizing Provider  albuterol (VENTOLIN HFA) 108 (90 Base) MCG/ACT inhaler Inhale 2 puffs into the lungs every 6 (six) hours as needed for wheezing. 11/10/21   Clayborne Dana, NP  amLODipine (NORVASC) 5 MG tablet Take 1 tablet (5 mg total) by mouth daily. 08/03/23 11/01/23  Jodelle Gross, NP  benazepril (LOTENSIN) 20 MG tablet TAKE ONE TABLET BY MOUTH DAILY 09/29/21   Everrett Coombe, DO  empagliflozin (JARDIANCE) 10 MG TABS tablet Take 10 mg by mouth daily.    [provider]  Fluticasone Propionate, Inhal, (FLOVENT DISKUS) 50 MCG/ACT AEPB Inhale 1 puff into the lungs daily. 11/10/21   Clayborne Dana, NP  metoprolol tartrate (LOPRESSOR) 25 MG tablet Take 1 tablet (25 mg total) by mouth 2 (two) times daily. 08/03/23 11/01/23  Jodelle Gross, NP  tamsulosin (FLOMAX) 0.4 MG CAPS capsule TAKE ONE CAPSULE BY MOUTH DAILY AFTER BREAKFAST 07/08/21   Everrett Coombe, DO      Allergies    Atorvastatin and Penicillins    Review of Systems   Review of Systems  Respiratory:  Positive for shortness of breath.     Physical Exam Updated Vital Signs BP 122/83   Pulse 99   Temp 97.7 F (36.5 C) (Oral)   Resp (!)  32   SpO2 100%  Physical Exam Vitals and nursing note reviewed.  Constitutional:      Appearance: He is well-developed.  HENT:     Head: Normocephalic and atraumatic.  Eyes:     Pupils: Pupils are equal, round, and reactive to light.  Neck:     Vascular: JVD present.  Cardiovascular:     Rate and Rhythm: Normal rate and regular rhythm.     Heart sounds: No murmur heard.    No friction rub. No gallop.  Pulmonary:     Effort: Tachypnea present. No respiratory distress.     Breath sounds: Examination of the right-lower field reveals rales. Examination of the left-lower field reveals rales. Rales present. No wheezing.  Abdominal:     General: There is no distension.     Tenderness: There is no abdominal tenderness. There is no guarding or rebound.  Musculoskeletal:        General: Normal range of motion.     Cervical back: Normal range of motion and neck supple.  Skin:    Coloration: Skin is not pale.     Findings: No rash.  Neurological:     Mental Status: He is alert and oriented to person, place, and time.  Psychiatric:        Behavior: Behavior normal.  ED Results / Procedures / Treatments   Labs (all labs ordered are listed, but only abnormal results are displayed) Labs Reviewed  SARS CORONAVIRUS 2 BY RT PCR - Abnormal; Notable for the following components:      Result Value   SARS Coronavirus 2 by RT PCR POSITIVE (*)    All other components within normal limits  BASIC METABOLIC PANEL - Abnormal; Notable for the following components:   Potassium 5.4 (*)    CO2 17 (*)    Glucose, Bld 227 (*)    BUN 29 (*)    Creatinine, Ser 1.96 (*)    Calcium 8.7 (*)    GFR, Estimated 39 (*)    All other components within normal limits  CBC - Abnormal; Notable for the following components:   WBC 20.6 (*)    RDW 16.2 (*)    Platelets 435 (*)    All other components within normal limits  BRAIN NATRIURETIC PEPTIDE - Abnormal; Notable for the following components:   B  Natriuretic Peptide 754.7 (*)    All other components within normal limits  I-STAT VENOUS BLOOD GAS, ED - Abnormal; Notable for the following components:   pCO2, Ven 37.9 (*)    pO2, Ven 23 (*)    Bicarbonate 19.1 (*)    TCO2 20 (*)    Acid-base deficit 7.0 (*)    Potassium 5.4 (*)    All other components within normal limits  TROPONIN I (HIGH SENSITIVITY) - Abnormal; Notable for the following components:   Troponin I (High Sensitivity) 41 (*)    All other components within normal limits  BLOOD GAS, VENOUS    EKG None  Radiology DG Chest Port 1 View  Result Date: 08/04/2023 CLINICAL DATA:  Shortness of breath EXAM: PORTABLE CHEST 1 VIEW COMPARISON:  11/10/2021 FINDINGS: Mild cardiomegaly. Interstitial prominence throughout the lungs, likely interstitial edema. No effusions or acute bony abnormality. IMPRESSION: Cardiomegaly with interstitial prominence, likely interstitial edema. Electronically Signed   By: Charlett Nose M.D.   On: 08/04/2023 03:40    Procedures .Critical Care  Performed by: Melene Plan, DO Authorized by: Melene Plan, DO   Critical care provider statement:    Critical care time (minutes):  35   Critical care time was exclusive of:  Separately billable procedures and treating other patients   Critical care was time spent personally by me on the following activities:  Development of treatment plan with patient or surrogate, discussions with consultants, evaluation of patient's response to treatment, examination of patient, ordering and review of laboratory studies, ordering and review of radiographic studies, ordering and performing treatments and interventions, pulse oximetry, re-evaluation of patient's condition and review of old charts   Care discussed with: admitting provider       Medications Ordered in ED Medications  furosemide (LASIX) injection 40 mg (40 mg Intravenous Given 08/04/23 0436)    ED Course/ Medical Decision Making/ A&P                                  Medical Decision Making Amount and/or Complexity of Data Reviewed Labs: ordered. Radiology: ordered.  Risk Prescription drug management. Decision regarding hospitalization.   57 yo M with a chief complaint of difficulty breathing.  This has been ongoing but rapidly worsened in the past 24 hours.  Presentation most consistent with acute pulmonary edema initially.  Newly hypoxic.  Will place on BiPAP.  Blood  work.  Chest x-ray independently interpreted by me with likely pulmonary edema.  Leukocytosis.  Troponins mildly elevated.  Patient is doing much better on BiPAP.  Work of breathing has almost completely resolved.  Will give a bolus dose of Lasix here.  Will discuss with medicine for admission.  The patients results and plan were reviewed and discussed.   Any x-rays performed were independently reviewed by myself.   Differential diagnosis were considered with the presenting HPI.  Medications  furosemide (LASIX) injection 40 mg (40 mg Intravenous Given 08/04/23 0436)    Vitals:   08/04/23 0345 08/04/23 0400 08/04/23 0415 08/04/23 0430  BP: 126/85 127/88 129/86 122/83  Pulse: 100 (!) 101 100 99  Resp: (!) 32 (!) 33 (!) 24 (!) 32  Temp:      TempSrc:      SpO2: 98% 99% 99% 100%    Final diagnoses:  Acute pulmonary edema (HCC)    Admission/ observation were discussed with the admitting physician, patient and/or family and they are comfortable with the plan.          Final Clinical Impression(s) / ED Diagnoses Final diagnoses:  Acute pulmonary edema [facility]    Rx / DC Orders ED Discharge Orders     None         Melene Plan, DO 08/04/23 [phone]

## 2023-08-04 NOTE — Progress Notes (Signed)
Pt transported on bipap from ED21 to ED01 without any complications.

## 2023-08-04 NOTE — Telephone Encounter (Signed)
Called patient to advise labs ordered by provider. Left message for patient to call office. Labs orders mailed to patient.

## 2023-08-04 NOTE — ED Notes (Signed)
Assumed care of patient in respiratory distress that worsened this evening. Patient sitting in tripod position tachypnea tachycardia placed on 4 liters oxygen via [place] to get sats above 90%. Lungs sound diminished, Patient unable to speak in full sentences is sob at rest. Patient a/o x 4

## 2023-08-04 NOTE — ED Notes (Signed)
Respiratory tech at bedside to place pt on bipap

## 2023-08-04 NOTE — Plan of Care (Signed)
Patient admitted to [facility] today.

## 2023-08-04 NOTE — ED Notes (Signed)
Pt is a&ox4, warm and dry to touch. Pt found standing next to bed after using bedside commode. Pt assisted back to bed and untangled from lines and cords. Pt states that he is feeling much better.No distress noted or reported. Pt attached to monitor/vitals. One side rail left down so that he can use the urinal. Bedside commode emptied. Light left down per patient request. Door closed. Family at the bedside.

## 2023-08-04 NOTE — ED Notes (Signed)
ED TO INPATIENT HANDOFF REPORT  ED Nurse Name and Phone #: [phone]  S Name/Age/Gender Jordan Taylor 57 y.o. male Room/Bed: 001C/001C  Code Status   Code Status: Full Code  Home/SNF/Other Home Patient oriented to: self, place, time, and situation Is this baseline? Yes   Triage Complete: Triage complete  Chief Complaint Acute hypoxic respiratory failure (HCC) [J96.01]  Triage Note Pt is coming in with a significant exacerbation of SHOB of breath that has progressively worsened since taking a new medication his doctor started him on today for his heart rate being too high. He states he took the medication at 3pm, and since the his [facility] has progressively worsened and it has been accompanied by some chest pain. Pt has a moderate level of anxiety associated with this. He reports no significant heart or lung history while in triage and can complete sentences. Upon arrival he had 81% spo2 on RA and with 4l [place] and coaching on his breathing he has reached and SpO2 of 92%   Allergies Allergies  Allergen Reactions   Amoxicillin Swelling   Atorvastatin Other (See Comments)    Severe muscle pain/Weakness   Penicillins Swelling    Level of Care/Admitting Diagnosis ED Disposition     ED Disposition  Admit   Condition  --   Comment  Hospital [facility]: [facility] [[id number]]  Level of Care: Progressive [102]  Admit to Progressive based on following criteria: RESPIRATORY PROBLEMS hypoxemic/hypercapnic respiratory failure that is responsive to NIPPV (BiPAP) or High Flow Nasal Cannula (6-80 lpm). Frequent assessment/intervention, no > Q2 hrs < Q4 hrs, to maintain oxygenation and pulmonary hygiene.  May admit patient to [facility] or [facility] if equivalent level of care is available:: No  Covid Evaluation: Confirmed COVID Positive  Diagnosis: Acute hypoxic respiratory failure [facility]) [[id number]]  Admitting Physician: Burnadette Pop [[id number]]  Attending Physician: Burnadette Pop [[id number]]  Certification:: I certify this patient will need inpatient services for at least 2 midnights  Expected Medical Readiness: 08/06/2023          B Medical/Surgery History Past Medical History:  Diagnosis Date   Essential hypertension 12/04/2013   Hyperlipidemia    Type 2 diabetes mellitus (HCC) 12/04/2013   Past Surgical History:  Procedure Laterality Date   ANTERIOR CERVICAL DECOMPRESSION/DISCECTOMY FUSION 4 LEVELS N/A 09/07/2016   Procedure: Anterior Cervical Diskectomy Fusion - Cervical three- Cervical four - Cervical four - Cervical five - Cervical five- Cervical six  - Cervical six - Cervical seven;  Surgeon: Julio Sicks, MD;  Location: [facility] OR;  Service: Neurosurgery;  Laterality: N/A;   CERVICAL SPINE SURGERY N/A    KNEE ARTHROSCOPY Left    spinal cord surgery     20+ yrs ago     A IV Location/Drains/Wounds Patient Lines/Drains/Airways Status     Active Line/Drains/Airways     Name Placement date Placement time Site Days   Peripheral IV 08/04/23 18 G Anterior;Distal;Right;Upper Arm 08/04/23  0328  Arm  less than 1            Intake/Output Last 24 hours  Intake/Output Summary (Last 24 hours) at 08/04/2023 1403 Last data filed at 08/04/2023 1401 Gross per 24 hour  Intake --  Output 1300 ml  Net -1300 ml    Labs/Imaging Results for orders placed or performed during the hospital encounter of 08/04/23 (from the past 48 hour(s))  Basic metabolic panel     Status: Abnormal   Collection Time: 08/04/23  3:27 AM  Result  Value Ref Range   Sodium 137 135 - 145 mmol/L   Potassium 5.4 (H) 3.5 - 5.1 mmol/L    Comment: HEMOLYSIS AT THIS LEVEL MAY AFFECT RESULT   Chloride 105 98 - 111 mmol/L   CO2 17 (L) 22 - 32 mmol/L   Glucose, Bld 227 (H) 70 - 99 mg/dL    Comment: Glucose reference range applies only to samples taken after fasting for at least 8 hours.   BUN 29 (H) 6 - 20 mg/dL   Creatinine, Ser 8.29 (H) 0.61 - 1.24 mg/dL   Calcium 8.7 (L) 8.9 - 10.3  mg/dL   GFR, Estimated 39 (L) >60 mL/min    Comment: (NOTE) Calculated using the CKD-EPI Creatinine Equation (2021)    Anion gap 15 5 - 15    Comment: Performed at [facility], [address], [place], [place] [address]  CBC     Status: Abnormal   Collection Time: 08/04/23  3:27 AM  Result Value Ref Range   WBC 20.6 (H) 4.0 - 10.5 K/uL   RBC 4.89 4.22 - 5.81 MIL/uL   Hemoglobin 14.2 13.0 - 17.0 g/dL   HCT 96.7 89.3 - 81.0 %   MCV 87.9 80.0 - 100.0 fL   MCH 29.0 26.0 - 34.0 pg   MCHC 33.0 30.0 - 36.0 g/dL   RDW 17.5 (H) 10.2 - 58.5 %   Platelets 435 (H) 150 - 400 K/uL   nRBC 0.1 0.0 - 0.2 %    Comment: Performed at [facility], [address] [place], [place] [address]  Brain natriuretic peptide     Status: Abnormal   Collection Time: 08/04/23  3:27 AM  Result Value Ref Range   B Natriuretic Peptide 754.7 (H) 0.0 - 100.0 pg/mL    Comment: Performed at [facility], [address], [place], [place] [address]  Troponin I (High Sensitivity)     Status: Abnormal   Collection Time: 08/04/23  3:27 AM  Result Value Ref Range   Troponin I (High Sensitivity) 41 (H) <18 ng/L    Comment: (NOTE) Elevated high sensitivity troponin I (hsTnI) values and significant  changes across serial measurements may suggest ACS but many other  chronic and acute conditions are known to elevate hsTnI results.  Refer to the "Links" section for chest pain algorithms and additional  guidance. Performed at [facility], [address], [place], [place] [address]   I-Stat venous blood gas, ED     Status: Abnormal   Collection Time: 08/04/23  3:33 AM  Result Value Ref Range   pH, Ven 7.311 7.25 - 7.43   pCO2, Ven 37.9 (L) 44 - 60 mmHg   pO2, Ven 23 (LL) 32 - 45 mmHg   Bicarbonate 19.1 (L) 20.0 - 28.0 mmol/L   TCO2 20 (L) 22 - 32 mmol/L   O2 Saturation 36 %   Acid-base deficit 7.0 (H) 0.0 - 2.0 mmol/L   Sodium 137 135 - 145 mmol/L   Potassium 5.4 (H) 3.5 - 5.1 mmol/L    Calcium, Ion 1.16 1.15 - 1.40 mmol/L   HCT 44.0 39.0 - 52.0 %   Hemoglobin 15.0 13.0 - 17.0 g/dL   Sample type VENOUS    Comment NOTIFIED PHYSICIAN   SARS Coronavirus 2 by RT PCR (hospital order, performed in [facility] hospital lab) *cepheid single result test* Anterior Nasal Swab     Status: Abnormal   Collection Time: 08/04/23  3:59 AM  Specimen: Anterior Nasal Swab  Result Value Ref Range   SARS Coronavirus 2 by RT PCR POSITIVE (A) NEGATIVE    Comment: Performed at [facility], [address] [place], [place] [address]  HIV Antibody (routine testing w rflx)     Status: None   Collection Time: 08/04/23  9:35 AM  Result Value Ref Range   HIV Screen 4th Generation wRfx Non Reactive Non Reactive    Comment: Performed at [facility], [address] [place], [place] [address]  Hemoglobin A1c     Status: Abnormal   Collection Time: 08/04/23  9:35 AM  Result Value Ref Range   Hgb A1c MFr Bld 7.6 (H) 4.8 - 5.6 %    Comment: (NOTE) Pre diabetes:          5.7%-6.4%  Diabetes:              >6.4%  Glycemic control for   <7.0% adults with diabetes    Mean Plasma Glucose 171.42 mg/dL    Comment: Performed at [facility], [address], [place], [place] [address]  C-reactive protein     Status: Abnormal   Collection Time: 08/04/23  9:35 AM  Result Value Ref Range   CRP 1.9 (H) <1.0 mg/dL    Comment: Performed at [facility], [address], [place], [place] [address]  D-dimer, quantitative     Status: Abnormal   Collection Time: 08/04/23  9:35 AM  Result Value Ref Range   D-Dimer, Quant 1.13 (H) 0.00 - 0.50 ug/mL-FEU    Comment: (NOTE) At the manufacturer cut-off value of 0.5 g/mL FEU, this assay has a negative predictive value of 95-100%.This assay is intended for use in conjunction with a clinical pretest probability (PTP) assessment model to exclude pulmonary embolism (PE) and deep venous thrombosis (DVT) in outpatients suspected of PE or  DVT. Results should be correlated with clinical presentation. Performed at [facility], [address] [place], [place] [address]   Procalcitonin     Status: None   Collection Time: 08/04/23  9:35 AM  Result Value Ref Range   Procalcitonin 0.19 ng/mL    Comment:        Interpretation: PCT (Procalcitonin) <= 0.5 ng/mL: Systemic infection (sepsis) is not likely. Local bacterial infection is possible. (NOTE)       Sepsis PCT Algorithm           Lower Respiratory Tract                                      Infection PCT Algorithm    ----------------------------     ----------------------------         PCT < 0.25 ng/mL                PCT < 0.10 ng/mL          Strongly encourage             Strongly discourage   discontinuation of antibiotics    initiation of antibiotics    ----------------------------     -----------------------------       PCT 0.25 - 0.50 ng/mL            PCT 0.10 - 0.25 ng/mL               OR       >80% decrease  in PCT            Discourage initiation of                                            antibiotics      Encourage discontinuation           of antibiotics    ----------------------------     -----------------------------         PCT >= 0.50 ng/mL              PCT 0.26 - 0.50 ng/mL               AND        <80% decrease in PCT             Encourage initiation of                                             antibiotics       Encourage continuation           of antibiotics    ----------------------------     -----------------------------        PCT >= 0.50 ng/mL                  PCT > 0.50 ng/mL               AND         increase in PCT                  Strongly encourage                                      initiation of antibiotics    Strongly encourage escalation           of antibiotics                                     -----------------------------                                           PCT <= 0.25 ng/mL                                                  OR                                        > 80% decrease in PCT                                      Discontinue / Do not initiate  antibiotics  Performed at [facility], [address] [place], [place] [address]   CBG monitoring, ED     Status: Abnormal   Collection Time: 08/04/23 12:03 PM  Result Value Ref Range   Glucose-Capillary 277 (H) 70 - 99 mg/dL    Comment: Glucose reference range applies only to samples taken after fasting for at least 8 hours.   DG Chest Port 1 View  Result Date: 08/04/2023 CLINICAL DATA:  Shortness of breath EXAM: PORTABLE CHEST 1 VIEW COMPARISON:  11/10/2021 FINDINGS: Mild cardiomegaly. Interstitial prominence throughout the lungs, likely interstitial edema. No effusions or acute bony abnormality. IMPRESSION: Cardiomegaly with interstitial prominence, likely interstitial edema. Electronically Signed   By: Charlett Nose M.D.   On: 08/04/2023 03:40    Pending Labs Unresulted Labs (From admission, onward)     Start     Ordered   08/05/23 0500  Comprehensive metabolic panel  Tomorrow morning,   R        08/04/23 0818   08/05/23 0500  CBC  Tomorrow morning,   R        08/04/23 0818   08/04/23 1500  Basic metabolic panel  ONCE - STAT,   STAT        08/04/23 0853   08/04/23 0821  C-reactive protein  Daily,   R      08/04/23 0820   08/04/23 0323  Blood gas, venous  Once,   R        08/04/23 0322            Vitals/Pain Today's Vitals   08/04/23 1200 08/04/23 1205 08/04/23 1230 08/04/23 1300  BP: 118/82  129/85 120/76  Pulse: 96  97 98  Resp: 18  17 18   Temp:  97.6 F (36.4 C)    TempSrc:  Oral    SpO2: 97%  98% 94%  PainSc:        Isolation Precautions Airborne and Contact precautions  Medications Medications  tamsulosin (FLOMAX) capsule 0.4 mg (0.4 mg Oral Given 08/04/23 0933)  albuterol (PROVENTIL) (2.5 MG/3ML) 0.083% nebulizer solution 3 mL (has no administration in time  range)  heparin injection 5,000 Units (has no administration in time range)  insulin aspart (novoLOG) injection 0-9 Units (5 Units Subcutaneous Given 08/04/23 1239)  methylPREDNISolone sodium succinate (SOLU-MEDROL) 125 mg/2 mL injection 80 mg (80 mg Intravenous Given 08/04/23 0932)  sodium bicarbonate tablet 650 mg (650 mg Oral Given 08/04/23 0933)  furosemide (LASIX) injection 60 mg (60 mg Intravenous Given 08/04/23 0933)  furosemide (LASIX) injection 40 mg (40 mg Intravenous Given 08/04/23 0436)  sodium zirconium cyclosilicate (LOKELMA) packet 10 g (10 g Oral Given 08/04/23 0933)    Mobility walks     Focused Assessments Cardiac Assessment Handoff:  Cardiac Rhythm: Normal sinus rhythm Lab Results  Component Value Date   CKTOTAL 416 (H) 09/08/2019   Lab Results  Component Value Date   DDIMER 1.13 (H) 08/04/2023   Does the Patient currently have chest pain? No   , Pulmonary Assessment Handoff:  Lung sounds: Bilateral Breath Sounds: Diminished L Breath Sounds: Diminished R Breath Sounds: Diminished O2 Device: High Flow Nasal Cannula O2 Flow Rate (L/min): 8 L/min    R Recommendations: See Admitting Provider Note  Report given to:   Additional Notes:

## 2023-08-04 NOTE — Addendum Note (Signed)
Addended by: Myna Hidalgo A on: 08/04/2023 09:50 AM   Modules accepted: Orders

## 2023-08-04 NOTE — Plan of Care (Signed)

## 2023-08-04 NOTE — ED Notes (Signed)
Spoke with 5W floor, unable to take patient at this time.

## 2023-08-05 ENCOUNTER — Inpatient Hospital Stay (HOSPITAL_COMMUNITY): Payer: BC Managed Care – PPO

## 2023-08-05 DIAGNOSIS — J9601 Acute respiratory failure with hypoxia: Secondary | ICD-10-CM | POA: Diagnosis not present

## 2023-08-05 DIAGNOSIS — M7989 Other specified soft tissue disorders: Secondary | ICD-10-CM

## 2023-08-05 DIAGNOSIS — I5031 Acute diastolic (congestive) heart failure: Secondary | ICD-10-CM

## 2023-08-05 DIAGNOSIS — U071 COVID-19: Secondary | ICD-10-CM | POA: Diagnosis not present

## 2023-08-05 DIAGNOSIS — I5022 Chronic systolic (congestive) heart failure: Secondary | ICD-10-CM | POA: Diagnosis not present

## 2023-08-05 DIAGNOSIS — E875 Hyperkalemia: Secondary | ICD-10-CM | POA: Diagnosis not present

## 2023-08-05 DIAGNOSIS — I5021 Acute systolic (congestive) heart failure: Secondary | ICD-10-CM | POA: Diagnosis not present

## 2023-08-05 LAB — COMPREHENSIVE METABOLIC PANEL
ALT: 50 U/L — ABNORMAL HIGH (ref 0–44)
AST: 16 U/L (ref 15–41)
Albumin: 3.1 g/dL — ABNORMAL LOW (ref 3.5–5.0)
Alkaline Phosphatase: 187 U/L — ABNORMAL HIGH (ref 38–126)
Anion gap: 10 (ref 5–15)
BUN: 43 mg/dL — ABNORMAL HIGH (ref 6–20)
CO2: 20 mmol/L — ABNORMAL LOW (ref 22–32)
Calcium: 8.5 mg/dL — ABNORMAL LOW (ref 8.9–10.3)
Chloride: 104 mmol/L (ref 98–111)
Creatinine, Ser: 2.19 mg/dL — ABNORMAL HIGH (ref 0.61–1.24)
GFR, Estimated: 34 mL/min — ABNORMAL LOW (ref >60)
Glucose, Bld: 284 mg/dL — ABNORMAL HIGH (ref 70–99)
Potassium: 5 mmol/L (ref 3.5–5.1)
Sodium: 134 mmol/L — ABNORMAL LOW (ref 135–145)
Total Bilirubin: 0.9 mg/dL (ref 0.3–1.2)
Total Protein: 6.3 g/dL — ABNORMAL LOW (ref 6.5–8.1)

## 2023-08-05 LAB — GLUCOSE, CAPILLARY
Glucose-Capillary: 239 mg/dL — ABNORMAL HIGH (ref 70–99)
Glucose-Capillary: 342 mg/dL — ABNORMAL HIGH (ref 70–99)
Glucose-Capillary: 264 mg/dL — ABNORMAL HIGH (ref 70–99)
Glucose-Capillary: 378 mg/dL — ABNORMAL HIGH (ref 70–99)

## 2023-08-05 LAB — CBC
HCT: 38.5 % — ABNORMAL LOW (ref 39.0–52.0)
Hemoglobin: 12.9 g/dL — ABNORMAL LOW (ref 13.0–17.0)
MCH: 29.5 pg (ref 26.0–34.0)
MCHC: 33.5 g/dL (ref 30.0–36.0)
MCV: 87.9 fL (ref 80.0–100.0)
Platelets: 380 10*3/uL (ref 150–400)
RBC: 4.38 MIL/uL (ref 4.22–5.81)
RDW: 15.6 % — ABNORMAL HIGH (ref 11.5–15.5)
WBC: 18.8 10*3/uL — ABNORMAL HIGH (ref 4.0–10.5)
nRBC: 0 % (ref 0.0–0.2)

## 2023-08-05 LAB — C-REACTIVE PROTEIN: CRP: 3.2 mg/dL — ABNORMAL HIGH (ref <1.0)

## 2023-08-05 LAB — ECHOCARDIOGRAM COMPLETE
AR max vel: 3.43 cm2
AV Area VTI: 3.28 cm2
AV Area mean vel: 3.4 cm2
AV Mean grad: 2 mmHg
AV Peak grad: 4 mmHg
Ao pk vel: 1 m/s
Area-P 1/2: 5.66 cm2
S' Lateral: 4.65 cm

## 2023-08-05 MED ORDER — SODIUM CHLORIDE 0.9 % IV SOLN: INTRAVENOUS | Status: DC

## 2023-08-05 MED ORDER — ASPIRIN 81 MG PO CHEW
81.0000 mg | CHEWABLE_TABLET | ORAL | Status: AC
  Administered 2023-08-06: 81 mg via ORAL
  Filled 2023-08-05: qty 1

## 2023-08-05 MED ORDER — PERFLUTREN LIPID MICROSPHERE
1.0000 mL | INTRAVENOUS | Status: AC | PRN
  Administered 2023-08-05: 3 mL via INTRAVENOUS

## 2023-08-05 MED ORDER — EMPAGLIFLOZIN 10 MG PO TABS
10.0000 mg | ORAL_TABLET | Freq: Every day | ORAL | Status: DC
  Administered 2023-08-05 – 2023-08-07 (×3): 10 mg via ORAL
  Filled 2023-08-05 (×3): qty 1

## 2023-08-05 MED ORDER — FUROSEMIDE 10 MG/ML IJ SOLN
80.0000 mg | Freq: Two times a day (BID) | INTRAMUSCULAR | Status: DC
  Administered 2023-08-05: 80 mg via INTRAVENOUS
  Filled 2023-08-05: qty 8

## 2023-08-05 NOTE — Progress Notes (Addendum)
   08/05/23 1213  TOC Brief Assessment  Insurance and Status Reviewed ([employer] PPO)  Patient has primary care physician Yes (Busy - Default status  Avbuere, Dorma Russell, MD)  Home environment has been reviewed Srom HOme with S/O  Prior level of function: independent   works as a Loss adjuster, chartered No current home services  Social Determinants of Health Reivew SDOH reviewed no interventions necessary  Readmission risk has been reviewed Yes (14%)  Transition of care needs no transition of care needs at this time   No orders for PT /OT eval at this time . S/O to transport home at DC     Transition of Care Department [facility]) has reviewed patient and no TOC needs have been identified at this time. We will continue to monitor patient advancement through interdisciplinary progression rounds. If new patient transition needs arise, please place a TOC consult. Transition of Care [facility]) Screening Note   Patient Details  Name: Jordan Taylor Date of Birth: [birth year]   Transition of Care [facility]) CM/SW Contact:    Gordy Clement, RN Phone Number: 08/05/2023, 12:15 PM

## 2023-08-05 NOTE — Progress Notes (Signed)
Lower extremity venous duplex completed. Please see CV Procedures for preliminary results.  Shona Simpson, RVT 08/05/23 1:34 PM

## 2023-08-05 NOTE — Inpatient Diabetes Management (Signed)
Inpatient Diabetes Program Recommendations  AACE/ADA: New Consensus Statement on Inpatient Glycemic Control (2015)  Target Ranges:  Prepandial:   less than 140 mg/dL      Peak postprandial:   less than 180 mg/dL (1-2 hours)      Critically ill patients:  140 - 180 mg/dL   Lab Results  Component Value Date   GLUCAP 239 (H) 08/05/2023   HGBA1C 7.6 (H) 08/04/2023    Review of Glycemic Control  Latest Reference Range & Units 08/04/23 12:03 08/04/23 17:55 08/04/23 22:54 08/05/23 07:42  Glucose-Capillary 70 - 99 mg/dL 409 (H) 811 (H) 914 (H) 239 (H)   Diabetes history: DM 2 Outpatient Diabetes medications: Jardiance 10 mg Daily, Tresiba 15 units Daily Current orders for Inpatient glycemic control:  Novolog 0-9 units tid  A1c 7.6% on 9/4 Solumedrol 80 mg Daily given yesterday (9/4) and this am (9/5)  Inpatient Diabetes Program Recommendations:    -   Add Semglee 8 units -   Add Novolog 3 units tid meal coverage if eating > 50 % of meals  Thanks,  Christena Deem RN, MSN, BC-ADM Inpatient Diabetes Coordinator Team Pager [phone] (8a-5p)

## 2023-08-05 NOTE — Plan of Care (Signed)
Patient states he feels tons better and weaned off oxygen.

## 2023-08-05 NOTE — Progress Notes (Addendum)
PROGRESS NOTE        PATIENT DETAILS Name: Jordan Taylor Age: 57 y.o. Sex: male Date of Birth: October 14, 1966 Admit Date: 08/04/2023 Admitting Physician Burnadette Pop, MD WUJ:[id number], Dorma Russell, MD  Brief Summary: Patient is a 57 y.o.  male with history of HTN, DM-2, chronic diastolic heart failure, CKD stage IIIb-who presented with worsening exertional dyspnea, orthopnea-and lately (over the past several days)-with worsening shortness of breath even at rest.  He recently traveled out of the country to [place] acknowledges significant fluid/dietary indiscretion.  On presentation-he was found to be hypoxic-initially requiring BiPAP-he had diffuse interstitial infiltrates-he tested positive for COVID-he was presumed to have either decompensated heart failure or COVID pneumonitis-started on steroids/diuretics and subsequently admitted to the hospitalist service.  See below for further details.   Significant events: 9/4>> admit to [facility]-hypoxic/short of breath-required BiPAP-Lasix/steroid given 9/5>> patient feels better-titrated off oxygen-on on room air.  Significant studies: 9/4>> CXR: Diffuse interstitial infiltrates 9/5>> CXR: Significant clearing of infiltrates (personally reviewed)  Significant microbiology data: 9/4>> COVID PCR:  Procedures: None  Consults: Cardiology  Subjective: Lying comfortably in bed-denies any chest pain or shortness of breath.  Objective: Vitals: Blood pressure 110/78, pulse 94, temperature 97.7 F (36.5 C), temperature source Oral, resp. rate 18, SpO2 96%.   Exam: Gen Exam:Alert awake-not in any distress HEENT:atraumatic, normocephalic Chest: B/L clear to auscultation anteriorly CVS:S1S2 regular Abdomen:soft non tender, non distended Extremities:++ edema Neurology: Non focal Skin: no rash  Pertinent Labs/Radiology:    Latest Ref Rng & Units 08/05/2023    4:09 AM 08/04/2023    3:33 AM 08/04/2023    3:27 AM  CBC  WBC  4.0 - 10.5 K/uL 18.8   20.6   Hemoglobin 13.0 - 17.0 g/dL 82.9  56.2  13.0   Hematocrit 39.0 - 52.0 % 38.5  44.0  43.0   Platelets 150 - 400 K/uL 380   435     Lab Results  Component Value Date   NA 134 (L) 08/05/2023   K 5.0 08/05/2023   CL 104 08/05/2023   CO2 20 (L) 08/05/2023      Assessment/Plan: Acute hypoxic respiratory failure secondary to HFpEF exacerbation In retrospect-given significant clearing of lung parenchyma on CXR today-this is all HFpEF exacerbation.  COVID pneumonitis ruled out-suspect COVID is just incidental finding. He was titrated to room air this am Stopping steroids Continue IV Lasix Restart Jardiance to augment diuresis Await echo Due to minimally elevated D-dimer-will check lower extremity Dopplers-but doubt VTE Cardiology consult  COVID-19 infection Suspect this is mostly an incidental finding His presentation is from CHF Stopping IV steroids  CKD stage IIIb Creatinine close to baseline Remains volume overloaded with 2+ pitting edema Continuing IV Lasix  Hyperkalemia Likely in the setting of ARB use with CKD Resolved with Lokelma/Lasix May need to consider stopping ARB on discharge.  Nonanion gap metabolic acidosis Secondary to CKD Maintain on oral bicarb supplementation  DM-2 with uncontrolled hyperglycemia due to steroids (A1c 7.6 on 9/4) Stopping steroids today-anticipate CBGs to improve Monitor on-if CBGs still persistently elevated-will start long-acting insulin.  HTN BP stable with diuretics Benazepril held for hyperkalemia Amlodipine on hold Await echo-probably will require beta-blockers  BPH Flomax Frequent bladder scans to ensure no retention.  Obesity: Estimated body mass index is 36.31 kg/m as calculated from the following:   Height as of 08/03/23: 5\' 5"  (  1.651 m).   Weight as of 08/03/23: 99 kg.   Code status:   Code Status: Full Code   DVT Prophylaxis: heparin injection 5,000 Units Start: 08/04/23 1400    Family Communication: Significant other at bedside   Disposition Plan: Status is: Inpatient Remains inpatient appropriate because: Severity of illness   Planned Discharge Destination:Home   Diet: Diet Order             Diet Heart Room service appropriate? Yes; Fluid consistency: Thin  Diet effective now                     Antimicrobial agents: Anti-infectives (From admission, onward)    None        MEDICATIONS: Scheduled Meds:  furosemide  80 mg Intravenous BID   heparin  5,000 Units Subcutaneous Q8H   insulin aspart  0-9 Units Subcutaneous TID WC   sodium bicarbonate  650 mg Oral BID   tamsulosin  0.4 mg Oral Daily   Continuous Infusions: PRN Meds:.albuterol   I have personally reviewed following labs and imaging studies  LABORATORY DATA: CBC: Recent Labs  Lab 08/04/23 0327 08/04/23 0333 08/05/23 0409  WBC 20.6*  --  18.8*  HGB 14.2 15.0 12.9*  HCT 43.0 44.0 38.5*  MCV 87.9  --  87.9  PLT 435*  --  380    Basic Metabolic Panel: Recent Labs  Lab 08/04/23 0327 08/04/23 0333 08/04/23 1519 08/05/23 0409  NA 137 137 135 134*  K 5.4* 5.4* 4.7 5.0  CL 105  --  105 104  CO2 17*  --  17* 20*  GLUCOSE 227*  --  305* 284*  BUN 29*  --  34* 43*  CREATININE 1.96*  --  2.11* 2.19*  CALCIUM 8.7*  --  8.6* 8.5*    GFR: Estimated Creatinine Clearance: 39.3 mL/min (A) (by C-G formula based on SCr of 2.19 mg/dL (H)).  Liver Function Tests: Recent Labs  Lab 08/05/23 0409  AST 16  ALT 50*  ALKPHOS 187*  BILITOT 0.9  PROT 6.3*  ALBUMIN 3.1*   No results for input(s): "LIPASE", "AMYLASE" in the last 168 hours. No results for input(s): "AMMONIA" in the last 168 hours.  Coagulation Profile: No results for input(s): "INR", "PROTIME" in the last 168 hours.  Cardiac Enzymes: No results for input(s): "CKTOTAL", "CKMB", "CKMBINDEX", "TROPONINI" in the last 168 hours.  BNP (last 3 results) No results for input(s): "PROBNP" in the last 8760  hours.  Lipid Profile: No results for input(s): "CHOL", "HDL", "LDLCALC", "TRIG", "CHOLHDL", "LDLDIRECT" in the last 72 hours.  Thyroid Function Tests: No results for input(s): "TSH", "T4TOTAL", "FREET4", "T3FREE", "THYROIDAB" in the last 72 hours.  Anemia Panel: No results for input(s): "VITAMINB12", "FOLATE", "FERRITIN", "TIBC", "IRON", "RETICCTPCT" in the last 72 hours.  Urine analysis:    Component Value Date/Time   COLORURINE COLORLESS (A) 11/11/2021 0138   APPEARANCEUR CLEAR 11/11/2021 0138   LABSPEC 1.025 11/11/2021 0138   PHURINE 5.0 11/11/2021 0138   GLUCOSEU >1,000 (A) 11/11/2021 0138   HGBUR TRACE (A) 11/11/2021 0138   BILIRUBINUR NEGATIVE 11/11/2021 0138   BILIRUBINUR neg 03/20/2019 1329   KETONESUR NEGATIVE 11/11/2021 0138   PROTEINUR TRACE (A) 11/11/2021 0138   UROBILINOGEN 0.2 03/20/2019 1329   NITRITE NEGATIVE 11/11/2021 0138   LEUKOCYTESUR NEGATIVE 11/11/2021 0138    Sepsis Labs: Lactic Acid, Venous    Component Value Date/Time   LATICACIDVEN 1.9 11/11/2021 0358    MICROBIOLOGY: Recent Results (from  the past 240 hour(s))  SARS Coronavirus 2 by RT PCR (hospital order, performed in [facility] hospital lab) *cepheid single result test* Anterior Nasal Swab     Status: Abnormal   Collection Time: 08/04/23  3:59 AM   Specimen: Anterior Nasal Swab  Result Value Ref Range Status   SARS Coronavirus 2 by RT PCR POSITIVE (A) NEGATIVE Final    Comment: Performed at [facility], [address], [place], [place] [address]    RADIOLOGY STUDIES/RESULTS: DG Chest Port 1V same Day  Result Date: 08/05/2023 CLINICAL DATA:  Shortness of breath. EXAM: PORTABLE CHEST 1 VIEW COMPARISON:  August 04, 2023. FINDINGS: Stable cardiomegaly. Both lungs are clear. The visualized skeletal structures are unremarkable. IMPRESSION: No active disease. Electronically Signed   By: Lupita Raider M.D.   On: 08/05/2023 08:02   DG Chest Port 1 View  Result Date:  08/04/2023 CLINICAL DATA:  Shortness of breath EXAM: PORTABLE CHEST 1 VIEW COMPARISON:  11/10/2021 FINDINGS: Mild cardiomegaly. Interstitial prominence throughout the lungs, likely interstitial edema. No effusions or acute bony abnormality. IMPRESSION: Cardiomegaly with interstitial prominence, likely interstitial edema. Electronically Signed   By: Charlett Nose M.D.   On: 08/04/2023 03:40     LOS: 1 day   Jeoffrey Massed, MD  [facility]    To contact the attending provider between 7A-7P or the covering provider during after hours 7P-7A, please log into the web site [email or web address] and access using universal [place] password for that web site. If you do not have the password, please call the hospital operator.  08/05/2023, 10:22 AM

## 2023-08-05 NOTE — Consult Note (Addendum)
Cardiology Consultation   Patient ID: Jordan Taylor MRN: [id number]; DOB: 1966-09-20  Admit date: 08/04/2023 Date of Consult: 08/05/2023  PCP:  Fleet Contras, MD   [facility] Providers Cardiologist:  Peter Swaziland, MD        Patient Profile:   Jordan Taylor is a 57 y.o. male with a hx of hypertension, DM 2, CKD and a history of pericardial effusion who is being seen 08/05/2023 for the evaluation of heart failure at the request of Dr. Jerral Ralph.  History of Present Illness:   Jordan Taylor is a 57 year old male with past medical history of hypertension, DM 2, CKD and a history of pericardial effusion.  Patient was initially referred to cardiology service for evaluation pericardial effusion.  He was seen in November 2022 with shortness of breath and persistent cough after developing upper respiratory infection.  He was tested positive for RSV.  CT showed no PE but did have small pleural effusion and a small to moderate pericardial effusion.  He also had reactive mediastinal and hilar adenopathy.  Echocardiogram obtained on 11/18/2021 showed EF 40 to 45%, global hypokinesis, mild LVH, grade 1 DD, normal RV systolic function, small pericardial effusion.  The echocardiogram was reviewed together with Dr. Flora Lipps who felt his ejection fraction was 55 to 60% and was falsely interpreted as low.  He was placed on phentermine for 6 months for weight loss, phentermine was discontinued roughly 1 month ago due to tachycardia.  In the past several months, he has been having worsening dyspnea on exertion.  This past week, he traveled to [place] for sightseeing.  He says while in [place], he had a hard time catching up to the rest of the group due to shortness of breath with exertion.  He denies any chest pain.  Symptom worsened over the past few days.  Eventually he sought medical attention at cardiology office on 08/30/2023, he was noted to be tachycardic with heart rate of 113 in the  office.  He was started on low-dose metoprolol tartrate 25 mg twice a day to help control the heart rate.  Due to worsening shortness of breath, he eventually sought medical attention at [facility], ED on 08/04/2023.  On arrival, significant lab work include creatinine of 1.96 which is near his baseline.  BNP 754.  Serial troponin 41.  White blood cell count 20.6.  Chest x-ray showed cardiomegaly with interstitial prominence likely interstitial edema.  EKG shows sinus tachycardia with right bundle branch block.  Hemoglobin A1c was 7.6.  CRP elevated.  Procalcitonin 0.19.  Patient underwent IV diuresis.  He received 100 mg IV Lasix yesterday morning and 60 mg yesterday afternoon.  He has been placed on 80 mg twice daily of IV Lasix.  Repeat chest x-ray this morning was normal.  He was continued on home Jardiance.  All home blood pressure medication has been held.  Cardiology service consulted for heart failure.    Past Medical History:  Diagnosis Date   Essential hypertension 12/04/2013   Hyperlipidemia    Type 2 diabetes mellitus (HCC) 12/04/2013    Past Surgical History:  Procedure Laterality Date   ANTERIOR CERVICAL DECOMPRESSION/DISCECTOMY FUSION 4 LEVELS N/A 09/07/2016   Procedure: Anterior Cervical Diskectomy Fusion - Cervical three- Cervical four - Cervical four - Cervical five - Cervical five- Cervical six  - Cervical six - Cervical seven;  Surgeon: Julio Sicks, MD;  Location: [facility] OR;  Service: Neurosurgery;  Laterality: N/A;   CERVICAL SPINE SURGERY N/A  KNEE ARTHROSCOPY Left    spinal cord surgery     20+ yrs ago     Home Medications:  Prior to Admission medications   Medication Sig Start Date End Date Taking? Authorizing Provider  albuterol (VENTOLIN HFA) 108 (90 Base) MCG/ACT inhaler Inhale 2 puffs into the lungs every 6 (six) hours as needed for wheezing. 11/10/21  Yes [name] B, NP  amLODipine (NORVASC) 5 MG tablet Take 1 tablet (5 mg total) by mouth daily. 08/03/23 11/01/23 Yes  Jodelle Gross, NP  benazepril (LOTENSIN) 20 MG tablet TAKE ONE TABLET BY MOUTH DAILY 09/29/21  Yes Everrett Coombe, DO  empagliflozin (JARDIANCE) 10 MG TABS tablet Take 10 mg by mouth daily.   Yes [provider]  Fluticasone Propionate, Inhal, (FLOVENT DISKUS) 50 MCG/ACT AEPB Inhale 1 puff into the lungs daily. 11/10/21  Yes Clayborne Dana, NP  metoprolol tartrate (LOPRESSOR) 25 MG tablet Take 1 tablet (25 mg total) by mouth 2 (two) times daily. 08/03/23 11/01/23 Yes Jodelle Gross, NP  tamsulosin (FLOMAX) 0.4 MG CAPS capsule TAKE ONE CAPSULE BY MOUTH DAILY AFTER BREAKFAST 07/08/21  Yes Matthews, Cody, DO  TRELEGY ELLIPTA 100-62.5-25 MCG/ACT AEPB Inhale 1 puff into the lungs daily. 07/30/23  Yes [provider]  TRESIBA FLEXTOUCH 100 UNIT/ML FlexTouch Pen Inject 15 Units into the skin daily. 07/03/23  Yes [provider]    Inpatient Medications: Scheduled Meds:  empagliflozin  10 mg Oral Daily   furosemide  80 mg Intravenous BID   heparin  5,000 Units Subcutaneous Q8H   insulin aspart  0-9 Units Subcutaneous TID WC   sodium bicarbonate  650 mg Oral BID   tamsulosin  0.4 mg Oral Daily   Continuous Infusions:  PRN Meds: albuterol  Allergies:    Allergies  Allergen Reactions   Amoxicillin Swelling   Atorvastatin Other (See Comments)    Severe muscle pain/Weakness   Penicillins Swelling    Social History:   Social History   Socioeconomic History   Marital status: Divorced    Spouse name: [name]   Number of children: 5   Years of education: College   Highest education level: Not on file  Occupational History   Occupation: Clinical cytogeneticist: Artist  Tobacco Use   Smoking status: Former   Smokeless tobacco: Never  Advertising account planner   Vaping status: Never Used  Substance and Sexual Activity   Alcohol use: No    Alcohol/week: 0.0 standard drinks of alcohol   Drug use: No    Comment: use CBD   Sexual activity: Yes     Partners: Female  Other Topics Concern   Not on file  Social History Narrative   Lives at home w/ his wife   Right-handed   Drinks about 10 cans of soda per day      Engineer, water.    Social Determinants of Health   Financial Resource Strain: Not on file  Food Insecurity: Patient Declined (08/05/2023)   Hunger Vital Sign    Worried About Running Out of Food in the Last Year: Patient declined    Ran Out of Food in the Last Year: Patient declined  Transportation Needs: Patient Declined (08/05/2023)   PRAPARE - Administrator, Civil Service (Medical): Patient declined    Lack of Transportation (Non-Medical): Patient declined  Physical Activity: Not on file  Stress: Not on file  Social Connections: Unknown (07/29/2023)   Received from [facility]  Social Network    Social Network: Not on file  Intimate Partner Violence: Patient Declined (08/05/2023)   Humiliation, Afraid, Rape, and Kick questionnaire    Fear of Current or Ex-Partner: Patient declined    Emotionally Abused: Patient declined    Physically Abused: Patient declined    Sexually Abused: Patient declined    Family History:    Family History  Problem Relation Age of Onset   Alcohol abuse Mother    Cancer Mother    Alcohol abuse Father    Diabetes Father    Hypertension Father    Cancer Sister    Cancer Other      ROS:  Please see the history of present illness.   All other ROS reviewed and negative.     Physical Exam/Data:   Vitals:   08/04/23 1627 08/04/23 2000 08/05/23 0401 08/05/23 0745  BP:  121/73 117/75 110/78  Pulse: (!) 102 (!) 179 95 94  Resp: (!) 22 (!) 27 18 18   Temp:  97.6 F (36.4 C) 97.6 F (36.4 C) 97.7 F (36.5 C)  TempSrc:  Oral Oral Oral  SpO2: 98% 97% 97% 96%    Intake/Output Summary (Last 24 hours) at 08/05/2023 1110 Last data filed at 08/05/2023 0844 Gross per 24 hour  Intake 960 ml  Output 2550 ml  Net -1590 ml      08/03/2023    1:33 PM 12/23/2021    8:32  AM 12/12/2021   12:41 PM  Last 3 Weights  Weight (lbs) 218 lb 3.2 oz 206 lb 203 lb  Weight (kg) 98.975 kg 93.441 kg 92.08 kg     There is no height or weight on file to calculate BMI.  General:  Well nourished, well developed, in no acute distress HEENT: normal Neck: no JVD Vascular: No carotid bruits; Distal pulses 2+ bilaterally Cardiac:  normal S1, S2; RRR; no murmur  Lungs:  clear to auscultation bilaterally, no wheezing, rhonchi or rales  Abd: soft, nontender, no hepatomegaly  Ext: no edema Musculoskeletal:  No deformities, BUE and BLE strength normal and equal Skin: warm and dry  Neuro:  CNs 2-12 intact, no focal abnormalities noted Psych:  Normal affect   EKG:  The EKG was personally reviewed and demonstrates: Normal sinus rhythm, right bundle branch block Telemetry:  Telemetry was personally reviewed and demonstrates: Normal sinus rhythm, no significant ventricular ectopy.  Relevant CV Studies:  Echo 11/26/2021  1. Left ventricular ejection fraction, by estimation, is 40 to 45%. The  left ventricle has mildly decreased function. The left ventricle  demonstrates global hypokinesis. There is mild concentric left ventricular  hypertrophy. Left ventricular diastolic  parameters are consistent with Grade I diastolic dysfunction (impaired  relaxation).   2. Right ventricular systolic function is normal. The right ventricular  size is normal.   3. A small pericardial effusion is present. The pericardial effusion is  circumferential. There is no evidence of cardiac tamponade.   4. The mitral valve is normal in structure. No evidence of mitral valve  regurgitation. No evidence of mitral stenosis.   5. The aortic valve is normal in structure. Aortic valve regurgitation is  not visualized. No aortic stenosis is present.   6. The inferior vena cava is normal in size with greater than 50%  respiratory variability, suggesting right atrial pressure of 3 mmHg.   Comparison(s): No  prior Echocardiogram.   Laboratory Data:  High Sensitivity Troponin:   Recent Labs  Lab 08/04/23 0327  TROPONINIHS 41*  Chemistry Recent Labs  Lab 08/04/23 0327 08/04/23 0333 08/04/23 1519 08/05/23 0409  NA 137 137 135 134*  K 5.4* 5.4* 4.7 5.0  CL 105  --  105 104  CO2 17*  --  17* 20*  GLUCOSE 227*  --  305* 284*  BUN 29*  --  34* 43*  CREATININE 1.96*  --  2.11* 2.19*  CALCIUM 8.7*  --  8.6* 8.5*  GFRNONAA 39*  --  35* 34*  ANIONGAP 15  --  13 10    Recent Labs  Lab 08/05/23 0409  PROT 6.3*  ALBUMIN 3.1*  AST 16  ALT 50*  ALKPHOS 187*  BILITOT 0.9   Lipids No results for input(s): "CHOL", "TRIG", "HDL", "LABVLDL", "LDLCALC", "CHOLHDL" in the last 168 hours.  Hematology Recent Labs  Lab 08/04/23 0327 08/04/23 0333 08/05/23 0409  WBC 20.6*  --  18.8*  RBC 4.89  --  4.38  HGB 14.2 15.0 12.9*  HCT 43.0 44.0 38.5*  MCV 87.9  --  87.9  MCH 29.0  --  29.5  MCHC 33.0  --  33.5  RDW 16.2*  --  15.6*  PLT 435*  --  380   Thyroid No results for input(s): "TSH", "FREET4" in the last 168 hours.  BNP Recent Labs  Lab 08/04/23 0327  BNP 754.7*    DDimer  Recent Labs  Lab 08/04/23 0935  DDIMER 1.13*     Radiology/Studies:  DG Chest Port 1V same Day  Result Date: 08/05/2023 CLINICAL DATA:  Shortness of breath. EXAM: PORTABLE CHEST 1 VIEW COMPARISON:  August 04, 2023. FINDINGS: Stable cardiomegaly. Both lungs are clear. The visualized skeletal structures are unremarkable. IMPRESSION: No active disease. Electronically Signed   By: Lupita Raider M.D.   On: 08/05/2023 08:02   DG Chest Port 1 View  Result Date: 08/04/2023 CLINICAL DATA:  Shortness of breath EXAM: PORTABLE CHEST 1 VIEW COMPARISON:  11/10/2021 FINDINGS: Mild cardiomegaly. Interstitial prominence throughout the lungs, likely interstitial edema. No effusions or acute bony abnormality. IMPRESSION: Cardiomegaly with interstitial prominence, likely interstitial edema. Electronically Signed    By: Charlett Nose M.D.   On: 08/04/2023 03:40     Assessment and Plan:   Acute CHF: Pending echocardiogram to assess the ejection fraction.  Previous echocardiogram in December 2022 showed EF 55 to 60% with mild pericardial effusion.  Patient has been complaining of worsening dyspnea on exertion for the past several weeks, symptom further worsened in the past week after returning from trip to [place].  If EF is low, he may need ischemic workup.  He has underwent aggressive IV diuresis since admission yesterday, he is currently euvolemic.  Consider change the patient to 40 mg daily of oral Lasix starting tomorrow. On home jardiance.   COVID-19: He denies any recent fever, chill, or cough.  White blood cell count on arrival was 20  History of pericardial effusion: Pending echocardiogram  Hypertension: All home blood pressure medication currently held.  Systolic blood pressure 110-120s.  DM II: Hemoglobin A1c 7.6.  CKD stage IIIb: Creatinine stable after diuresis   Risk Assessment/Risk Scores:        New York Heart Association (NYHA) Functional Class NYHA Class III        For questions or updates, please contact [facility] Please consult [email or web address] for contact info under    Signed, Azalee Course, PA  08/05/2023 11:10 AM  Personally seen and examined. Agree with above.  57 year old here with acute systolic  heart failure.  Found to be incidentally COVID-positive.  No fevers.  Has been short of breath over the last several weeks which culminated with his recent trip to [place] where he could not keep up with his group because of shortness of breath.  Had trouble getting his baggage off of the airplane felt winded at the end of the I will.  Had orthopnea had to sleep sitting up.  This has been accumulating.  No early family history of CAD or heart failure Non-smoker, rare cigar use No alcohol use  He does have diabetes, treated hypertension over the last 10  years chronic kidney disease stage III A  After receiving IV Lasix here, he feels much better.  Is able to lay flat without difficulty.  Breathing has improved but is not at baseline.  He was seen in the office setting and was found to be tachycardic which could be a result of low cardiac output.  Metoprolol was administered but he felt worse.  This was when he went to the emergency room.  Repeat chest x-ray personally reviewed now normal.  Previously with pulmonary edema.  Able to complete full sentence without difficulty, emotional after I discussed weakness of his heart.  Creatinine 2.1 BNP 750  Acute systolic heart failure -I personally reviewed his echocardiogram from today and his ejection fraction appears to be in the 35% range with Definity contrast.  There is more hypokinesis of the apical segments than basal.  Left ventricle is also dilated. -Clinical symptoms of orthopnea, shortness of breath all compatible with elevated left ventricular end-diastolic pressures and reduced ejection fraction. -We will go ahead and proceed with right and left heart catheterization.  Risk and benefits have been explained including stroke heart attack death renal impairment bleeding.  We will hold off on any aggressive hydration because of recent heart failure symptoms. -Explained possible etiologies of heart failure which include ischemic cardiomyopathy/coronary artery disease, longstanding hypertension, viral cardiomyopathy.  -Eventually we will continue to push goal-directed medical therapy which will include hopefully Entresto, spironolactone, Jardiance, Toprol when able.  Explained to him that his increased heart rate is likely result of decreased cardiac output. -He has not had any fevers or coughs recently.  I think the COVID positive diagnosis is asymptomatic.  I think it is reasonable to proceed with cardiac catheterization.  Donato Schultz, MD

## 2023-08-06 ENCOUNTER — Other Ambulatory Visit (HOSPITAL_COMMUNITY): Payer: Self-pay

## 2023-08-06 ENCOUNTER — Encounter (HOSPITAL_COMMUNITY)
Admission: EM | Disposition: A | Discharge status: Home / Self Care | Payer: Self-pay | Source: Home / Self Care | Attending: Internal Medicine

## 2023-08-06 DIAGNOSIS — I428 Other cardiomyopathies: Secondary | ICD-10-CM

## 2023-08-06 DIAGNOSIS — J9601 Acute respiratory failure with hypoxia: Secondary | ICD-10-CM | POA: Diagnosis not present

## 2023-08-06 DIAGNOSIS — E875 Hyperkalemia: Secondary | ICD-10-CM | POA: Diagnosis not present

## 2023-08-06 DIAGNOSIS — I5022 Chronic systolic (congestive) heart failure: Secondary | ICD-10-CM | POA: Diagnosis not present

## 2023-08-06 DIAGNOSIS — U071 COVID-19: Secondary | ICD-10-CM | POA: Diagnosis not present

## 2023-08-06 HISTORY — PX: RIGHT/LEFT HEART CATH AND CORONARY ANGIOGRAPHY: CATH118266

## 2023-08-06 LAB — POCT I-STAT 7, (LYTES, BLD GAS, ICA,H+H)
Acid-base deficit: 6 mmol/L — ABNORMAL HIGH (ref 0.0–2.0)
Bicarbonate: 18 mmol/L — ABNORMAL LOW (ref 20.0–28.0)
Calcium, Ion: 1 mmol/L — ABNORMAL LOW (ref 1.15–1.40)
HCT: 36 % — ABNORMAL LOW (ref 39.0–52.0)
Hemoglobin: 12.2 g/dL — ABNORMAL LOW (ref 13.0–17.0)
O2 Saturation: 96 %
Potassium: 3.3 mmol/L — ABNORMAL LOW (ref 3.5–5.1)
Sodium: 146 mmol/L — ABNORMAL HIGH (ref 135–145)
TCO2: 19 mmol/L — ABNORMAL LOW (ref 22–32)
pCO2 arterial: 31 mmHg — ABNORMAL LOW (ref 32–48)
pH, Arterial: 7.373 (ref 7.35–7.45)
pO2, Arterial: 80 mmHg — ABNORMAL LOW (ref 83–108)

## 2023-08-06 LAB — BASIC METABOLIC PANEL
Anion gap: 11 (ref 5–15)
BUN: 54 mg/dL — ABNORMAL HIGH (ref 6–20)
CO2: 20 mmol/L — ABNORMAL LOW (ref 22–32)
Calcium: 8.3 mg/dL — ABNORMAL LOW (ref 8.9–10.3)
Chloride: 103 mmol/L (ref 98–111)
Creatinine, Ser: 1.89 mg/dL — ABNORMAL HIGH (ref 0.61–1.24)
GFR, Estimated: 40 mL/min — ABNORMAL LOW (ref >60)
Glucose, Bld: 352 mg/dL — ABNORMAL HIGH (ref 70–99)
Potassium: 4.5 mmol/L (ref 3.5–5.1)
Sodium: 134 mmol/L — ABNORMAL LOW (ref 135–145)

## 2023-08-06 LAB — GLUCOSE, CAPILLARY
Glucose-Capillary: 224 mg/dL — ABNORMAL HIGH (ref 70–99)
Glucose-Capillary: 139 mg/dL — ABNORMAL HIGH (ref 70–99)
Glucose-Capillary: 72 mg/dL (ref 70–99)
Glucose-Capillary: 305 mg/dL — ABNORMAL HIGH (ref 70–99)

## 2023-08-06 LAB — POCT I-STAT EG7
Acid-base deficit: 4 mmol/L — ABNORMAL HIGH (ref 0.0–2.0)
Bicarbonate: 21.6 mmol/L (ref 20.0–28.0)
Calcium, Ion: 1.16 mmol/L (ref 1.15–1.40)
HCT: 39 % (ref 39.0–52.0)
Hemoglobin: 13.3 g/dL (ref 13.0–17.0)
O2 Saturation: 71 %
Potassium: 3.6 mmol/L (ref 3.5–5.1)
Sodium: 143 mmol/L (ref 135–145)
TCO2: 23 mmol/L (ref 22–32)
pCO2, Ven: 40.5 mmHg — ABNORMAL LOW (ref 44–60)
pH, Ven: 7.334 (ref 7.25–7.43)
pO2, Ven: 40 mmHg (ref 32–45)

## 2023-08-06 LAB — CBC
HCT: 37.4 % — ABNORMAL LOW (ref 39.0–52.0)
Hemoglobin: 12.8 g/dL — ABNORMAL LOW (ref 13.0–17.0)
MCH: 30.2 pg (ref 26.0–34.0)
MCHC: 34.2 g/dL (ref 30.0–36.0)
MCV: 88.2 fL (ref 80.0–100.0)
Platelets: 377 10*3/uL (ref 150–400)
RBC: 4.24 MIL/uL (ref 4.22–5.81)
RDW: 15.7 % — ABNORMAL HIGH (ref 11.5–15.5)
WBC: 21.2 10*3/uL — ABNORMAL HIGH (ref 4.0–10.5)
nRBC: 0.1 % (ref 0.0–0.2)

## 2023-08-06 SURGERY — RIGHT/LEFT HEART CATH AND CORONARY ANGIOGRAPHY
Anesthesia: LOCAL

## 2023-08-06 MED ORDER — SODIUM CHLORIDE 0.9% FLUSH
3.0000 mL | Freq: Two times a day (BID) | INTRAVENOUS | Status: DC
  Administered 2023-08-07: 3 mL via INTRAVENOUS

## 2023-08-06 MED ORDER — FENTANYL CITRATE (PF) 100 MCG/2ML IJ SOLN
INTRAMUSCULAR | Status: AC
  Filled 2023-08-06: qty 2

## 2023-08-06 MED ORDER — IOHEXOL 350 MG/ML SOLN
INTRAVENOUS | Status: DC | PRN
  Administered 2023-08-06: 30 mL

## 2023-08-06 MED ORDER — MIDAZOLAM HCL 2 MG/2ML IJ SOLN
INTRAMUSCULAR | Status: DC | PRN
  Administered 2023-08-06: 2 mg via INTRAVENOUS

## 2023-08-06 MED ORDER — HYDRALAZINE HCL 20 MG/ML IJ SOLN: 10.0000 mg | INTRAMUSCULAR | Status: AC | PRN

## 2023-08-06 MED ORDER — MIDAZOLAM HCL 2 MG/2ML IJ SOLN
INTRAMUSCULAR | Status: AC
  Filled 2023-08-06: qty 2

## 2023-08-06 MED ORDER — LABETALOL HCL 5 MG/ML IV SOLN: 10.0000 mg | INTRAVENOUS | Status: AC | PRN

## 2023-08-06 MED ORDER — VERAPAMIL HCL 2.5 MG/ML IV SOLN
INTRAVENOUS | Status: AC
  Filled 2023-08-06: qty 2

## 2023-08-06 MED ORDER — LIDOCAINE HCL (PF) 1 % IJ SOLN
INTRAMUSCULAR | Status: DC | PRN
  Administered 2023-08-06: 5 mL
  Administered 2023-08-06: 12 mL

## 2023-08-06 MED ORDER — SODIUM CHLORIDE 0.9% FLUSH: 3.0000 mL | INTRAVENOUS | Status: DC | PRN

## 2023-08-06 MED ORDER — SODIUM CHLORIDE 0.9 % IV SOLN: 250.0000 mL | INTRAVENOUS | Status: DC | PRN

## 2023-08-06 MED ORDER — LIDOCAINE HCL (PF) 1 % IJ SOLN
INTRAMUSCULAR | Status: AC
  Filled 2023-08-06: qty 30

## 2023-08-06 MED ORDER — HEPARIN (PORCINE) IN NACL 1000-0.9 UT/500ML-% IV SOLN
INTRAVENOUS | Status: DC | PRN
  Administered 2023-08-06 (×2): 500 mL

## 2023-08-06 MED ORDER — HEPARIN SODIUM (PORCINE) 5000 UNIT/ML IJ SOLN
5000.0000 [IU] | Freq: Three times a day (TID) | INTRAMUSCULAR | Status: DC
  Administered 2023-08-06 – 2023-08-07 (×2): 5000 [IU] via SUBCUTANEOUS
  Filled 2023-08-06 (×2): qty 1

## 2023-08-06 MED ORDER — FENTANYL CITRATE (PF) 100 MCG/2ML IJ SOLN
INTRAMUSCULAR | Status: DC | PRN
  Administered 2023-08-06: 50 ug via INTRAVENOUS

## 2023-08-06 MED ORDER — VERAPAMIL HCL 2.5 MG/ML IV SOLN
INTRAVENOUS | Status: DC | PRN
  Administered 2023-08-06: 10 mL via INTRA_ARTERIAL

## 2023-08-06 MED ORDER — SODIUM CHLORIDE 0.9 % IV SOLN: INTRAVENOUS | Status: DC

## 2023-08-06 MED ORDER — HEPARIN SODIUM (PORCINE) 1000 UNIT/ML IJ SOLN
INTRAMUSCULAR | Status: AC
  Filled 2023-08-06: qty 10

## 2023-08-06 MED ORDER — HEPARIN SODIUM (PORCINE) 1000 UNIT/ML IJ SOLN
INTRAMUSCULAR | Status: DC | PRN
  Administered 2023-08-06: 5000 [IU] via INTRAVENOUS

## 2023-08-06 MED ORDER — ACETAMINOPHEN 325 MG PO TABS
650.0000 mg | ORAL_TABLET | Freq: Four times a day (QID) | ORAL | Status: DC | PRN
  Administered 2023-08-06: 650 mg via ORAL
  Filled 2023-08-06: qty 2

## 2023-08-06 SURGICAL SUPPLY — 12 items
CATH 5FR JL3.5 JR4 ANG PIG MP (CATHETERS) IMPLANT
CATH BALLN WEDGE 5F 110CM (CATHETERS) IMPLANT
CATH SWAN GANZ 7F STRAIGHT (CATHETERS) IMPLANT
DEVICE RAD COMP TR BAND LRG (VASCULAR PRODUCTS) IMPLANT
GLIDESHEATH SLEND SS 6F .021 (SHEATH) IMPLANT
GUIDEWIRE INQWIRE 1.5J.035X260 (WIRE) IMPLANT
INQWIRE 1.5J .035X260CM (WIRE) ×1
PACK CARDIAC CATHETERIZATION (CUSTOM PROCEDURE TRAY) ×1 IMPLANT
SET ATX-X65L (MISCELLANEOUS) IMPLANT
SHEATH GLIDE SLENDER 4/5FR (SHEATH) IMPLANT
SHEATH PINNACLE 7F 10CM (SHEATH) IMPLANT
SHEATH PROBE COVER 6X72 (BAG) IMPLANT

## 2023-08-06 NOTE — Progress Notes (Signed)
PROGRESS NOTE        PATIENT DETAILS Name: Jordan Taylor Age: 57 y.o. Sex: male Date of Birth: 10-01-1966 Admit Date: 08/04/2023 Admitting Physician Burnadette Pop, MD [id number], Dorma Russell, MD  Brief Summary: Patient is a 57 y.o.  male with history of HTN, DM-2, chronic diastolic heart failure, CKD stage IIIb-who presented with worsening exertional dyspnea, orthopnea-and lately (over the past several days)-with worsening shortness of breath even at rest.  He recently traveled out of the country to [place] acknowledges significant fluid/dietary indiscretion.  On presentation-he was found to be hypoxic-initially requiring BiPAP-he had diffuse interstitial infiltrates-he tested positive for COVID-he was presumed to have either decompensated heart failure or COVID pneumonitis-started on steroids/diuretics and subsequently admitted to the hospitalist service.  Patient rapidly improved with diuretics-transitioned to room air the very next day with significant parenchymal clearing on repeat chest x-ray as well-consistent with decompensated heart failure rather than COVID-19 pneumonitis.  Echo subsequently revealed new onset systolic heart failure-cardiology was consulted-with plans to pursue LHC on 9/6.  Had see below for further details.   Significant events: 9/4>> admit to [facility]-hypoxic/short of breath-required BiPAP-Lasix/steroid given 9/5>> patient feels better-titrated off oxygen-on on room air.  CXR with parenchymal clearing.  Echo with low EF-cardiology consulted.  Significant studies: 9/4>> CXR: Diffuse interstitial infiltrates 9/5>> CXR: Significant clearing of infiltrates (personally reviewed) 9/5>> echo: EF 30-35%, global hypokinesis 9/5>> bilateral lower extremity Doppler: No DVT  Significant microbiology data: 9/4>> COVID PCR:  Procedures: None  Consults: Cardiology  Subjective: No complaints-on room air-not short of  breath.  Objective: Vitals: Blood pressure (!) 123/90, pulse 97, temperature (!) 97.3 F (36.3 C), temperature source Oral, resp. rate 20, SpO2 95%.   Exam: Gen Exam:Alert awake-not in any distress HEENT:atraumatic, normocephalic Chest: B/L clear to auscultation anteriorly CVS:S1S2 regular Abdomen:soft non tender, non distended Extremities:+ edema Neurology: Non focal Skin: no rash  Pertinent Labs/Radiology:    Latest Ref Rng & Units 08/06/2023    4:17 AM 08/05/2023    4:09 AM 08/04/2023    3:33 AM  CBC  WBC 4.0 - 10.5 K/uL 21.2  18.8    Hemoglobin 13.0 - 17.0 g/dL 96.2  95.2  84.1   Hematocrit 39.0 - 52.0 % 37.4  38.5  44.0   Platelets 150 - 400 K/uL 377  380      Lab Results  Component Value Date   NA 134 (L) 08/06/2023   K 4.5 08/06/2023   CL 103 08/06/2023   CO2 20 (L) 08/06/2023      Assessment/Plan: Acute hypoxic respiratory failure secondary to new onset acute HFrEF.   In retrospect-given rapid improvement in hypoxia (on room air) and significant clearing of lung parenchyma on repeat chest x-ray-clinically more consistent with decompensated heart failure.  Subsequent echo showed suppressed EF as well. He is almost euvolemic on exam-IV Lasix currently on hold as patient is planned for RHC/LHC today No longer on steroids Remains on Jardiance RHC/LHC will determine further treatment strategies including beta-blocker/ARNI/antiplatelets etc. Appreciate cardiology input  Note-acute on chronic HFpEF ruled out.  COVID-19 infection Suspect this is mostly an incidental finding His presentation is from CHF No longer on IV steroids-this was discontinued 9/5.  CKD stage IIIb Creatinine close to baseline Significant improvement in volume status Lasix on hold as RHC/LHC planned.  Hyperkalemia Likely in the setting of ARB use with CKD Resolved  with Lokelma/Lasix Will need to be cautious with resuming ARB in the future.  Nonanion gap metabolic acidosis Secondary to  CKD Maintain on oral bicarb supplementation  DM-2 with uncontrolled hyperglycemia due to steroids (A1c 7.6 on 9/4) CBGs gradually improving after stopping steroids  Currently n.p.o. for LHC/RHC Continue SSI for now Reassess on 9/7-4 initiation of long-acting insulin.  HTN BP stable-currently not on any antihypertensives. Benazepril held for hyperkalemia Amlodipine remains on hold  BPH Flomax Frequent bladder scans to ensure no retention.  Obesity: Estimated body mass index is 36.31 kg/m as calculated from the following:   Height as of 08/03/23: 5\' 5"  (1.651 m).   Weight as of 08/03/23: 99 kg.   Code status:   Code Status: Full Code   DVT Prophylaxis: heparin injection 5,000 Units Start: 08/04/23 1400   Family Communication: Significant other at bedside on 9/5   Disposition Plan: Status is: Inpatient Remains inpatient appropriate because: Severity of illness   Planned Discharge Destination:Home   Diet: Diet Order             Diet NPO time specified Except for: Sips with Meds  Diet effective midnight                     Antimicrobial agents: Anti-infectives (From admission, onward)    None        MEDICATIONS: Scheduled Meds:  empagliflozin  10 mg Oral Daily   heparin  5,000 Units Subcutaneous Q8H   insulin aspart  0-9 Units Subcutaneous TID WC   sodium bicarbonate  650 mg Oral BID   tamsulosin  0.4 mg Oral Daily   Continuous Infusions:  sodium chloride 10 mL/hr at 08/06/23 0650   PRN Meds:.albuterol   I have personally reviewed following labs and imaging studies  LABORATORY DATA: CBC: Recent Labs  Lab 08/04/23 0327 08/04/23 0333 08/05/23 0409 08/06/23 0417  WBC 20.6*  --  18.8* 21.2*  HGB 14.2 15.0 12.9* 12.8*  HCT 43.0 44.0 38.5* 37.4*  MCV 87.9  --  87.9 88.2  PLT 435*  --  380 377    Basic Metabolic Panel: Recent Labs  Lab 08/04/23 0327 08/04/23 0333 08/04/23 1519 08/05/23 0409 08/06/23 0417  NA 137 137 135 134* 134*   K 5.4* 5.4* 4.7 5.0 4.5  CL 105  --  105 104 103  CO2 17*  --  17* 20* 20*  GLUCOSE 227*  --  305* 284* 352*  BUN 29*  --  34* 43* 54*  CREATININE 1.96*  --  2.11* 2.19* 1.89*  CALCIUM 8.7*  --  8.6* 8.5* 8.3*    GFR: Estimated Creatinine Clearance: 45.5 mL/min (A) (by C-G formula based on SCr of 1.89 mg/dL (H)).  Liver Function Tests: Recent Labs  Lab 08/05/23 0409  AST 16  ALT 50*  ALKPHOS 187*  BILITOT 0.9  PROT 6.3*  ALBUMIN 3.1*   No results for input(s): "LIPASE", "AMYLASE" in the last 168 hours. No results for input(s): "AMMONIA" in the last 168 hours.  Coagulation Profile: No results for input(s): "INR", "PROTIME" in the last 168 hours.  Cardiac Enzymes: No results for input(s): "CKTOTAL", "CKMB", "CKMBINDEX", "TROPONINI" in the last 168 hours.  BNP (last 3 results) No results for input(s): "PROBNP" in the last 8760 hours.  Lipid Profile: No results for input(s): "CHOL", "HDL", "LDLCALC", "TRIG", "CHOLHDL", "LDLDIRECT" in the last 72 hours.  Thyroid Function Tests: No results for input(s): "TSH", "T4TOTAL", "FREET4", "T3FREE", "THYROIDAB" in the last 72  hours.  Anemia Panel: No results for input(s): "VITAMINB12", "FOLATE", "FERRITIN", "TIBC", "IRON", "RETICCTPCT" in the last 72 hours.  Urine analysis:    Component Value Date/Time   COLORURINE COLORLESS (A) 11/11/2021 0138   APPEARANCEUR CLEAR 11/11/2021 0138   LABSPEC 1.025 11/11/2021 0138   PHURINE 5.0 11/11/2021 0138   GLUCOSEU >1,000 (A) 11/11/2021 0138   HGBUR TRACE (A) 11/11/2021 0138   BILIRUBINUR NEGATIVE 11/11/2021 0138   BILIRUBINUR neg 03/20/2019 1329   KETONESUR NEGATIVE 11/11/2021 0138   PROTEINUR TRACE (A) 11/11/2021 0138   UROBILINOGEN 0.2 03/20/2019 1329   NITRITE NEGATIVE 11/11/2021 0138   LEUKOCYTESUR NEGATIVE 11/11/2021 0138    Sepsis Labs: Lactic Acid, Venous    Component Value Date/Time   LATICACIDVEN 1.9 11/11/2021 0358    MICROBIOLOGY: Recent Results (from the past  240 hour(s))  SARS Coronavirus 2 by RT PCR (hospital order, performed in [facility] hospital lab) *cepheid single result test* Anterior Nasal Swab     Status: Abnormal   Collection Time: 08/04/23  3:59 AM   Specimen: Anterior Nasal Swab  Result Value Ref Range Status   SARS Coronavirus 2 by RT PCR POSITIVE (A) NEGATIVE Final    Comment: Performed at [facility], [address] [place], [place] [address]    RADIOLOGY STUDIES/RESULTS: VAS [place] LOWER EXTREMITY VENOUS (DVT)  Result Date: 08/05/2023  Lower Venous DVT Study Patient Name:  OWEN CAUGHLIN  Date of Exam:   08/05/2023 Medical Rec #: [id number]          Accession #:    [id number] Date of Birth: Apr 11, 1966          Patient Gender: M Patient Age:   57 years Exam Location:  [facility] Procedure:      VAS [place] LOWER EXTREMITY VENOUS (DVT) Referring Phys: Jeoffrey Massed --------------------------------------------------------------------------------  Indications: Swelling, and Edema.  Risk Factors: None identified. Anticoagulation: Heparin. Comparison Study: No prior study Performing Technologist: Shona Simpson  Examination Guidelines: A complete evaluation includes B-mode imaging, spectral Doppler, color Doppler, and power Doppler as needed of all accessible portions of each vessel. Bilateral testing is considered an integral part of a complete examination. Limited examinations for reoccurring indications may be performed as noted. The reflux portion of the exam is performed with the patient in reverse Trendelenburg.  +---------+---------------+---------+-----------+----------+--------------+ RIGHT    CompressibilityPhasicitySpontaneityPropertiesThrombus Aging +---------+---------------+---------+-----------+----------+--------------+ CFV      Full           Yes      Yes                                 +---------+---------------+---------+-----------+----------+--------------+ SFJ      Full                                                         +---------+---------------+---------+-----------+----------+--------------+ FV Prox  Full                                                        +---------+---------------+---------+-----------+----------+--------------+ FV Mid   Full                                                        +---------+---------------+---------+-----------+----------+--------------+  FV DistalFull                                                        +---------+---------------+---------+-----------+----------+--------------+ PFV      Full                                                        +---------+---------------+---------+-----------+----------+--------------+ POP      Full           Yes      Yes                                 +---------+---------------+---------+-----------+----------+--------------+ PTV      Full                                                        +---------+---------------+---------+-----------+----------+--------------+ PERO     Full                                                        +---------+---------------+---------+-----------+----------+--------------+   +---------+---------------+---------+-----------+----------+--------------+ LEFT     CompressibilityPhasicitySpontaneityPropertiesThrombus Aging +---------+---------------+---------+-----------+----------+--------------+ CFV      Full           Yes      Yes                                 +---------+---------------+---------+-----------+----------+--------------+ SFJ      Full                                                        +---------+---------------+---------+-----------+----------+--------------+ FV Prox  Full                                                        +---------+---------------+---------+-----------+----------+--------------+ FV Mid   Full                                                         +---------+---------------+---------+-----------+----------+--------------+ FV DistalFull                                                        +---------+---------------+---------+-----------+----------+--------------+  PFV      Full                                                        +---------+---------------+---------+-----------+----------+--------------+ POP      Full           Yes      Yes                                 +---------+---------------+---------+-----------+----------+--------------+ PTV      Full                                                        +---------+---------------+---------+-----------+----------+--------------+ PERO     Full                                                        +---------+---------------+---------+-----------+----------+--------------+     Summary: BILATERAL: - No evidence of deep vein thrombosis seen in the lower extremities, bilaterally. -No evidence of popliteal cyst, bilaterally.   *See table(s) above for measurements and observations.    Preliminary    ECHOCARDIOGRAM COMPLETE  Result Date: 08/05/2023    ECHOCARDIOGRAM REPORT   Patient Name:   JOZIYAH BLOSSER Date of Exam: 08/05/2023 Medical Rec #:  [id number]         Height:       65.0 in Accession #:    [id number]        Weight:       218.2 lb Date of Birth:  06-09-1966         BSA:          2.053 m Patient Age:    57 years          BP:           129/88 mmHg Patient Gender: M                 HR:           110 bpm. Exam Location:  Inpatient Procedure: 2D Echo, Cardiac Doppler, Color Doppler, 3D Echo and Intracardiac            Opacification Agent Indications:    CHF-Acute Diastolic I50.31  History:        Patient has prior history of Echocardiogram examinations, most                 recent 11/26/2021. CHF; Risk Factors:Dyslipidemia, Diabetes,                 Hypertension and Former Smoker. H/O COVID.  Sonographer:    Dondra Prader RVT RCS Referring Phys: [phone] AMRIT  ADHIKARI IMPRESSIONS  1. Left ventricular ejection fraction, by estimation, is 30 to 35%. The left ventricle has moderately decreased function. The left ventricle demonstrates global hypokinesis. The left ventricular internal cavity size was mildly dilated. Left ventricular diastolic parameters are indeterminate.  2. Right ventricular systolic function is normal. The  right ventricular size is normal.  3. The mitral valve is normal in structure. Trivial mitral valve regurgitation. No evidence of mitral stenosis.  4. The aortic valve is tricuspid. Aortic valve regurgitation is not visualized. No aortic stenosis is present.  5. The inferior vena cava is normal in size with greater than 50% respiratory variability, suggesting right atrial pressure of 3 mmHg. FINDINGS  Left Ventricle: Left ventricular ejection fraction, by estimation, is 30 to 35%. The left ventricle has moderately decreased function. The left ventricle demonstrates global hypokinesis. Definity contrast agent was given IV to delineate the left ventricular endocardial borders. The left ventricular internal cavity size was mildly dilated. There is no left ventricular hypertrophy. Abnormal (paradoxical) septal motion, consistent with left bundle branch block. Left ventricular diastolic parameters  are indeterminate. Right Ventricle: The right ventricular size is normal. Right ventricular systolic function is normal. Left Atrium: Left atrial size was normal in size. Right Atrium: Right atrial size was normal in size. Pericardium: Trivial pericardial effusion is present. Mitral Valve: The mitral valve is normal in structure. Trivial mitral valve regurgitation. No evidence of mitral valve stenosis. Tricuspid Valve: The tricuspid valve is normal in structure. Tricuspid valve regurgitation is not demonstrated. No evidence of tricuspid stenosis. Aortic Valve: The aortic valve is tricuspid. Aortic valve regurgitation is not visualized. No aortic stenosis is  present. Aortic valve mean gradient measures 2.0 mmHg. Aortic valve peak gradient measures 4.0 mmHg. Aortic valve area, by VTI measures 3.28 cm. Pulmonic Valve: The pulmonic valve was normal in structure. Pulmonic valve regurgitation is not visualized. No evidence of pulmonic stenosis. Aorta: The aortic root is normal in size and structure. Venous: The inferior vena cava is normal in size with greater than 50% respiratory variability, suggesting right atrial pressure of 3 mmHg. IAS/Shunts: No atrial level shunt detected by color flow Doppler.  LEFT VENTRICLE PLAX 2D LVIDd:         6.05 cm LVIDs:         4.65 cm LV PW:         0.95 cm LV IVS:        0.85 cm LVOT diam:     2.10 cm   3D Volume EF: LV SV:         50        3D EF:        48 % LV SV Index:   24        LV EDV:       156 ml LVOT Area:     3.46 cm  LV ESV:       82 ml                          LV SV:        75 ml RIGHT VENTRICLE             IVC RV Basal diam:  4.10 cm     IVC diam: 1.90 cm RV S prime:     10.80 cm/s TAPSE (M-mode): 2.2 cm LEFT ATRIUM             Index        RIGHT ATRIUM           Index LA diam:        3.70 cm 1.80 cm/m   RA Area:     18.10 cm LA Vol (A2C):   53.4 ml 26.01 ml/m  RA Volume:  49.90 ml  24.31 ml/m LA Vol (A4C):   50.8 ml 24.75 ml/m LA Biplane Vol: 54.7 ml 26.65 ml/m  AORTIC VALVE                    PULMONIC VALVE AV Area (Vmax):    3.43 cm     PV Vmax:       0.72 m/s AV Area (Vmean):   3.40 cm     PV Peak grad:  2.0 mmHg AV Area (VTI):     3.28 cm AV Vmax:           100.00 cm/s AV Vmean:          65.800 cm/s AV VTI:            0.152 m AV Peak Grad:      4.0 mmHg AV Mean Grad:      2.0 mmHg LVOT Vmax:         99.10 cm/s LVOT Vmean:        64.500 cm/s LVOT VTI:          0.144 m LVOT/AV VTI ratio: 0.95  AORTA Ao Root diam: 3.20 cm Ao Asc diam:  3.30 cm MITRAL VALVE MV Area (PHT): 5.66 cm     SHUNTS MV Decel Time: 134 msec     Systemic VTI:  0.14 m MV E velocity: 145.00 cm/s  Systemic Diam: 2.10 cm Olga Millers MD  Electronically signed by Olga Millers MD Signature Date/Time: 08/05/2023/1:22:52 PM    Final    DG Chest Port 1V same Day  Result Date: 08/05/2023 CLINICAL DATA:  Shortness of breath. EXAM: PORTABLE CHEST 1 VIEW COMPARISON:  August 04, 2023. FINDINGS: Stable cardiomegaly. Both lungs are clear. The visualized skeletal structures are unremarkable. IMPRESSION: No active disease. Electronically Signed   By: Lupita Raider M.D.   On: 08/05/2023 08:02     LOS: 2 days   Jeoffrey Massed, MD  [facility]    To contact the attending provider between 7A-7P or the covering provider during after hours 7P-7A, please log into the web site [email or web address] and access using universal [employer] password for that web site. If you do not have the password, please call the hospital operator.  08/06/2023, 8:28 AM

## 2023-08-06 NOTE — H&P (View-Only) (Signed)
Patient Name: Jordan Taylor Date of Encounter: 08/06/2023 [facility] Cardiologist: Peter Swaz[place], MD   Interval Summary  .    57 yr old male with PMH of HTN, type 2 DM, HLD, CKD IIIb, pericardial effusion, who is admitted for acute systolic heart failure, incidentally found COVID-19 positive,  cardiology is following for this matter.   Patient states he is feeling overall better, denied any SOB at rest, not aware of any leg swelling. He states he has been coughing for a while. He felt otherwise improving. He confirmed several months of SOB with exertion and orthopnea. He is able to remain flat in bed.   Vital Signs .    Vitals:   08/05/23 1605 08/05/23 1900 08/05/23 2300 08/06/23 0300  BP: 122/79 133/85 135/87 127/79  Pulse: 100   99  Resp: 18   17  Temp: 97.8 F (36.6 C) (!) 97.4 F (36.3 C) (!) 97.4 F (36.3 C) 97.8 F (36.6 C)  TempSrc: Oral Oral Oral Oral  SpO2: 92%   94%    Intake/Output Summary (Last 24 hours) at 08/06/2023 0653 Last data filed at 08/05/2023 1800 Gross per 24 hour  Intake 1380 ml  Output 1725 ml  Net -345 ml      08/03/2023    1:33 PM 12/23/2021    8:32 AM 12/12/2021   12:41 PM  Last 3 Weights  Weight (lbs) 218 lb 3.2 oz 206 lb 203 lb  Weight (kg) 98.975 kg 93.441 kg 92.08 kg      Telemetry/ECG    Sinus rhythm  - Personally Reviewed  Echo from 08/05/23:   1. Left ventricular ejection fraction, by estimation, is 30 to 35%. The  left ventricle has moderately decreased function. The left ventricle  demonstrates global hypokinesis. The left ventricular internal cavity size  was mildly dilated. Left ventricular  diastolic parameters are indeterminate.   2. Right ventricular systolic function is normal. The right ventricular  size is normal.   3. The mitral valve is normal in structure. Trivial mitral valve  regurgitation. No evidence of mitral stenosis.   4. The aortic valve is tricuspid. Aortic valve regurgitation is not   visualized. No aortic stenosis is present.   5. The inferior vena cava is normal in size with greater than 50%  respiratory variability, suggesting right atrial pressure of 3 mmHg.     Physical Exam .   GEN: No acute distress.   Neck: No JVD Cardiac: RRR, no murmurs, rubs, or gallops.  Respiratory: Clear to auscultation bilaterally. On room air.  GI: Soft, nontender, non-distended  MS: No leg edema  Assessment & Plan .     Acute systolic heart failure - presented with few months of DOE and orthopnea that progressively worsened over the last few days - BNP 754, Hs trop 41 x1, Cr 1.96 >2.19 >1.89, WBC 20600, CRP 1.9 > 3.2, COVID-19 +, CXR with interstitial edema since admission, procal 0.19, D dimer 1.13, EKG no acute ischemic findings - Echo 9/5 showed LVEF 30-35%, global hypokinesis, indeterminate diastolic parameter, normal RV, trivial MR (Echo in 2022 with LVEF 55-60% with small pericardial effusion upon review of his primary cardiologist and Dr Flora Lipps ) - clinical picture is likely multifactorial from acute systolic heart failure + COVID-19 infection - s/p IV Lasix, diuresis hold now, Net - 2.1 L since admission, clinically appears euvolemic now, recommend transition to PO Lasix 40mg  daily going forward  - R/L heart cath is planned today , NPO, low  contrast load given CKD IIIb - consider workup for PE given COVID-19/recent travel with VQ scan - GDMT: stop PTA benazepril and amlodipine, BP stable and CKD IIIb improving, would add metoprolol XL 25mg  daily if cath does not reveal low output, jardiance 10mg  started, hold off adding Entresto/spironolactone for now/trend BMP    Acute hypoxic respiratory failure  COVID-19 infection  CKD IIIb HTN Type 2 DM  - per primary team       Informed Consent   Shared Decision Making/Informed Consent  The risks [stroke (1 in 1000), death (1 in 1000), kidney failure [usually temporary] (1 in 500), bleeding (1 in 200), allergic reaction  [possibly serious] (1 in 200)], benefits (diagnostic support and management of coronary artery disease) and alternatives of a cardiac catheterization were discussed in detail with [name] and he is willing to proceed.       For questions or updates, please contact [facility] Please consult [email or web address] for contact info under        Signed, Cyndi Bender, NP    57 year old with acute systolic heart failure.  Has had orthopnea at home for the last several days, progressive symptoms over weeks to months.  Ejection fraction 30 to 35% on echocardiogram.  Diabetic with chronic kidney disease stage IIIb.  We are going to proceed with heart catheterization.  Discussed with Dr. Clifton James.  Understand creatinine of approximately 2.  We need to exclude the possibility of triple-vessel disease.  Appreciate their assistance.  This will be helpful for longitudinal care.  COVID-positive however not receiving any specific therapies for COVID secondary to lack of COVID symptoms.  His shortness of breath/orthopnea has improved with IV Lasix.  White count was elevated however improving.  Partial reactivity.  Donato Schultz, MD

## 2023-08-06 NOTE — Progress Notes (Signed)
   Heart Failure Stewardship Pharmacist Progress Note   PCP: Fleet Contras, MD PCP-Cardiologist: Peter Swaziland, MD    HPI:  57 yo M with PMH of HTN, HLD, T2DM, CHF, CKD IIIb, and pericardial effusion.   Presented to the ED on 9/4 with shortness of breath, tachycardia, orthopnea, and chest pain. Was seen by cardiology the day before and was started on metoprolol for atrial tachycardia (HR 117 in clinic). He reported worsening shortness of breath and took himself to the ER. Found to be COVID +. CXR showed signs of acute pulmonary edema. BNP elevated. ECHO showed LVEF reduced to 30-35%, global hypokinesis, RV normal, trivial MR. Last EF interpreted as 55-60% in 2022. Scheduled for [facility] on 9/6.   Current HF Medications: SGLT2i: Jardiance 10 mg daily  Prior to admission HF Medications: Beta blocker: metoprolol tartrate 25 mg BID ACE/ARB/ARNI: benazepril 20 mg daily SGLT2i: Jardiance 10 mg daily  Pertinent Lab Values: Serum creatinine 1.89, BUN 54, Potassium 4.5, Sodium 134, BNP 754.7, A1c 7.6   Vital Signs: Weight: 218 lbs (admission weight: 218 lbs) Blood pressure: 120/80s  Heart rate: 90-100s  I/O: net -1.1L yesterday; net -2.2L since admission  Medication Assistance / Insurance Benefits Check: Does the patient have prescription insurance?  Yes Type of insurance plan: Financial risk analyst  Outpatient Pharmacy:  Prior to admission outpatient pharmacy: [facility] Is the patient willing to use [facility] TOC pharmacy at discharge? Yes Is the patient willing to transition their outpatient pharmacy to utilize a [facility] outpatient pharmacy?   No - insurance out of network    Assessment: 1. Acute systolic CHF (LVEF 30-35%), pending R/LHC today. NYHA class III symptoms. - Holding IV lasix for cardiac cath today. Strict I/Os and daily weights. Keep K>4 and Mg>2. - Holding BB as this was started the day before admission and he is being treated for acute CHF. Pending cath today to  assess cardiac output and volume.  - Consider adding Entresto after cath today. Creatinine has been improving with IV diuresis.  - Consider adding spironolactone and SGLT2i prior to discharge   Plan: 1) Medication changes recommended at this time: - Add Entresto 24/26 mg BID after cath today   2) Patient assistance: - Insurance is out of network, cannot confirm copay amounts for medications - Since he has Nurse, learning disability, monthly copay cards can be provided to him before discharge  3)  Education  - Patient has been educated on current HF medications and potential additions to HF medication regimen - Patient verbalizes understanding that over the next few months, these medication doses may change and more medications may be added to optimize HF regimen - Patient has been educated on basic disease state pathophysiology and goals of therapy   Sharen Hones, PharmD, BCPS Heart Failure Engineer, building services Phone [phone]

## 2023-08-06 NOTE — Progress Notes (Signed)
    Cardiac catheterization showed no evidence of coronary artery disease.  Pulmonary capillary wedge pressure was 12 mmHg, normal.  Well compensated.  Good job with Lasix diuresis.  Most recent creatinine 1.89. (GFR 40)  Would start Entresto 24/26 milligrams p.o. twice daily as well as Jardiance 10 mg once a day tomorrow.    Check basic metabolic profile in 2 weeks as outpatient.  May be able for discharge tomorrow.  He has close follow-up with cardiology in 2 weeks.  Donato Schultz, MD

## 2023-08-06 NOTE — Inpatient Diabetes Management (Signed)
Inpatient Diabetes Program Recommendations  AACE/ADA: New Consensus Statement on Inpatient Glycemic Control (2015)  Target Ranges:  Prepandial:   less than 140 mg/dL      Peak postprandial:   less than 180 mg/dL (1-2 hours)      Critically ill patients:  140 - 180 mg/dL   Lab Results  Component Value Date   GLUCAP 224 (H) 08/06/2023   HGBA1C 7.6 (H) 08/04/2023    Review of Glycemic Control  Diabetes history: type 2 Outpatient Diabetes medications: Tresiba 15 units daily, jardiance 10 mg daily Current orders for Inpatient glycemic control: Novolog 0-9 units correction scale TID, Jardiance 10 mg daily  Inpatient Diabetes Program Recommendations:   Noted that blood sugars have been greater than 180 mg/dl.   Recommend starting Semglee 12 units daily if blood sugars continue to be elevated. May need to start Novolog 3 units TID with meals and if patient eats at least 50% of meal and if blood sugars continue to be elevated.   Smith Mince RN BSN CDE Diabetes Coordinator Pager: [phone]  8am-5pm

## 2023-08-06 NOTE — Progress Notes (Addendum)
Patient Name: Jordan Taylor Date of Encounter: 08/06/2023 Jordan Taylor Cardiologist: Jordan Swaziland, MD   Interval Summary  .    57 yr old male with PMH of HTN, type 2 DM, HLD, CKD IIIb, pericardial effusion, who is admitted for acute systolic heart failure, incidentally found COVID-19 positive,  cardiology is following for this matter.   Patient states he is feeling overall better, denied any SOB at rest, not aware of any leg swelling. He states he has been coughing for a while. He felt otherwise improving. He confirmed several months of SOB with exertion and orthopnea. He is able to remain flat in bed.   Vital Signs .    Vitals:   08/05/23 1605 08/05/23 1900 08/05/23 2300 08/06/23 0300  BP: 122/79 133/85 135/87 127/79  Pulse: 100   99  Resp: 18   17  Temp: 97.8 F (36.6 C) (!) 97.4 F (36.3 C) (!) 97.4 F (36.3 C) 97.8 F (36.6 C)  TempSrc: Oral Oral Oral Oral  SpO2: 92%   94%    Intake/Output Summary (Last 24 hours) at 08/06/2023 0653 Last data filed at 08/05/2023 1800 Gross per 24 hour  Intake 1380 ml  Output 1725 ml  Net -345 ml      08/03/2023    1:33 PM 12/23/2021    8:32 AM 12/12/2021   12:41 PM  Last 3 Weights  Weight (lbs) 218 lb 3.2 oz 206 lb 203 lb  Weight (kg) 98.975 kg 93.441 kg 92.08 kg      Telemetry/ECG    Sinus rhythm  - Personally Reviewed  Echo from 08/05/23:   1. Left ventricular ejection fraction, by estimation, is 30 to 35%. The  left ventricle has moderately decreased function. The left ventricle  demonstrates global hypokinesis. The left ventricular internal cavity size  was mildly dilated. Left ventricular  diastolic parameters are indeterminate.   2. Right ventricular systolic function is normal. The right ventricular  size is normal.   3. The mitral valve is normal in structure. Trivial mitral valve  regurgitation. No evidence of mitral stenosis.   4. The aortic valve is tricuspid. Aortic valve regurgitation is not   visualized. No aortic stenosis is present.   5. The inferior vena cava is normal in size with greater than 50%  respiratory variability, suggesting right atrial pressure of 3 mmHg.     Physical Exam .   GEN: No acute distress.   Neck: No JVD Cardiac: RRR, no murmurs, rubs, or gallops.  Respiratory: Clear to auscultation bilaterally. On room air.  GI: Soft, nontender, non-distended  MS: No leg edema  Assessment & Plan .     Acute systolic heart failure - presented with few months of DOE and orthopnea that progressively worsened over the last few days - BNP 754, Hs trop 41 x1, Cr 1.96 >2.19 >1.89, WBC 20600, CRP 1.9 > 3.2, COVID-19 +, CXR with interstitial edema since admission, procal 0.19, D dimer 1.13, EKG no acute ischemic findings - Echo 9/5 showed LVEF 30-35%, global hypokinesis, indeterminate diastolic parameter, normal RV, trivial MR (Echo in 2022 with LVEF 55-60% with small pericardial effusion upon review of his primary cardiologist and Dr Jordan Taylor ) - clinical picture is likely multifactorial from acute systolic heart failure + COVID-19 infection - s/p IV Lasix, diuresis hold now, Net - 2.1 L since admission, clinically appears euvolemic now, recommend transition to PO Lasix 40mg  daily going forward  - R/L heart cath is planned today , NPO, low  contrast load given CKD IIIb - consider workup for PE given COVID-19/recent travel with VQ scan - GDMT: stop PTA benazepril and amlodipine, BP stable and CKD IIIb improving, would add metoprolol XL 25mg  daily if cath does not reveal low output, jardiance 10mg  started, hold off adding Entresto/spironolactone for now/trend BMP    Acute hypoxic respiratory failure  COVID-19 infection  CKD IIIb HTN Type 2 DM  - per primary team       Informed Consent   Shared Decision Making/Informed Consent  The risks [stroke (1 in 59), death (1 in 1000), kidney failure [usually temporary] (1 in 500), bleeding (1 in 200), allergic reaction  [possibly serious] (1 in 200)], benefits (diagnostic support and management of coronary artery disease) and alternatives of a cardiac catheterization were discussed in detail with Jordan Taylor and he is willing to proceed. death (1 in 1000), kidney failure [usually temporary] (1 in 500), bleeding (1 in 200), allergic reaction  [possibly serious] (1 in 200)], benefits (diagnostic support and management of coronary artery disease) and alternatives of a cardiac catheterization were discussed in detail with Jordan Taylor and he is willing to proceed.       For questions or updates, please contact Jordan Taylor Please consult [email or web address] for contact info under        Signed, Jordan Bender, NP    57 year old with acute systolic heart failure.  Has had orthopnea at home for the last several days, progressive symptoms over weeks to months.  Ejection fraction 30 to 35% on echocardiogram.  Diabetic with chronic kidney disease stage IIIb.  We are going to proceed with heart catheterization.  Discussed with Dr. Clifton James.  Understand creatinine of approximately 2.  We need to exclude the possibility of triple-vessel disease.  Appreciate their assistance.  This will be helpful for longitudinal care.  COVID-positive however not receiving any specific therapies for COVID secondary to lack of COVID symptoms.  His shortness of breath/orthopnea has improved with IV Lasix.  White count was elevated however improving.  Partial reactivity.  Jordan Schultz, MD

## 2023-08-06 NOTE — Plan of Care (Signed)

## 2023-08-06 NOTE — Progress Notes (Signed)
Heart Failure Navigator Progress Note  PCP: Fleet Contras, MD PCP-Cardiologist: Dr. Swaziland Admission Diagnosis: Acute hypoxic respiratory failure Admitted from: Home  Presentation:   57 yo M with PMH of HTN, HLD, T2DM, CHF, CKD IIIb, and pericardial effusion.    Presented to the ED on 9/4 with shortness of breath, tachycardia, orthopnea, and chest pain. Was seen by cardiology the day before and was started on metoprolol for atrial tachycardia (HR 117 in clinic). He reported worsening shortness of breath and took himself to the ER. Found to be COVID +. CXR showed signs of acute pulmonary edema. BNP elevated. ECHO showed LVEF reduced to 30-35%, global hypokinesis, RV normal, trivial MR. Last EF interpreted as 55-60% in 2022.  R/LHC on 9/6 without evidence of CAD.   ECHO/ LVEF: 30-35%  Education Assessment and Provision:  Detailed education and instructions provided on heart failure disease management including the following:  Signs and symptoms of Heart Failure When to call the physician Importance of daily weights Low sodium diet Fluid restriction Medication management Anticipated future follow-up appointments  Patient education given on each of the above topics.  Patient acknowledges understanding via teach back method and acceptance of all instructions.  Education Materials:  "Living Better With Heart Failure" Booklet, HF zone tool, & Daily Weight Tracker Tool.  Patient has scale at home: yes Patient has pill box at home: yes    Clinical Course:  Past Medical History:  Diagnosis Date   Essential hypertension 12/04/2013   Hyperlipidemia    Type 2 diabetes mellitus (HCC) 12/04/2013     Social History   Socioeconomic History   Marital status: Divorced    Spouse name: [name]   Number of children: 5   Years of education: Automotive engineer   Highest education level: Not on file  Occupational History   Occupation: Clinical cytogeneticist: Artist  Tobacco  Use   Smoking status: Former   Smokeless tobacco: Never  Advertising account planner   Vaping status: Never Used  Substance and Sexual Activity   Alcohol use: No    Alcohol/week: 0.0 standard drinks of alcohol   Drug use: No    Comment: use CBD   Sexual activity: Yes    Partners: Female  Other Topics Concern   Not on file  Social History Narrative   Lives at home w/ his wife   Right-handed   Drinks about 10 cans of soda per day      Engineer, water.    Social Determinants of Health   Financial Resource Strain: Not on file  Food Insecurity: Patient Declined (08/05/2023)   Hunger Vital Sign    Worried About Running Out of Food in the Last Year: Patient declined    Ran Out of Food in the Last Year: Patient declined  Transportation Needs: Patient Declined (08/05/2023)   PRAPARE - Administrator, Civil Service (Medical): Patient declined    Lack of Transportation (Non-Medical): Patient declined  Physical Activity: Not on file  Stress: Not on file  Social Connections: Unknown (07/29/2023)   [facility]   Social Network    Social Network: Not on file    High Risk Criteria for Readmission and/or Poor Patient Outcomes: Heart failure hospital admissions (last 6 months): 0  No Show rate: 8% Difficult social situation: no Demonstrates medication adherence: yes Primary Language: English  Barriers of Care:   Diet/fluids Insurance out of network for [employer] - cannot verify med costs  Considerations/Referrals:   Referral made to Heart Failure Pharmacist Stewardship: yes Referral made to Heart Failure CSW/NCM TOC: no Referral made to Heart & Vascular TOC clinic: yes, appt scheduled for 9/25 (after [facility] appt 9/19)  Items for Follow-up on DC/TOC: Continued HF education Volume GDMT optimization   Sharen Hones, PharmD, BCPS Heart Failure Stewardship Pharmacist Phone [phone]

## 2023-08-06 NOTE — Interval H&P Note (Signed)
History and Physical Interval Note:  08/06/2023 2:26 PM  Jordan Taylor  has presented today for surgery, with the diagnosis of heart failure.  The various methods of treatment have been discussed with the patient and family. After consideration of risks, benefits and other options for treatment, the patient has consented to  Procedure(s): RIGHT/LEFT HEART CATH AND CORONARY ANGIOGRAPHY (N/A) as a surgical intervention.  The patient's history has been reviewed, patient examined, no change in status, stable for surgery.  I have reviewed the patient's chart and labs.  Questions were answered to the patient's satisfaction.    Cath Lab Visit (complete for each Cath Lab visit)  Clinical Evaluation Leading to the Procedure:   ACS: No.  Non-ACS:    Anginal Classification: CCS II  Anti-ischemic medical therapy: Maximal Therapy (2 or more classes of medications)  Non-Invasive Test Results: No non-invasive testing performed  Prior CABG: No previous CABG        Verne Carrow

## 2023-08-07 ENCOUNTER — Other Ambulatory Visit (HOSPITAL_COMMUNITY): Payer: Self-pay

## 2023-08-07 DIAGNOSIS — I5043 Acute on chronic combined systolic (congestive) and diastolic (congestive) heart failure: Secondary | ICD-10-CM

## 2023-08-07 DIAGNOSIS — J9601 Acute respiratory failure with hypoxia: Secondary | ICD-10-CM | POA: Diagnosis not present

## 2023-08-07 LAB — CBC
HCT: 40.4 % (ref 39.0–52.0)
Hemoglobin: 13.1 g/dL (ref 13.0–17.0)
MCH: 28.7 pg (ref 26.0–34.0)
MCHC: 32.4 g/dL (ref 30.0–36.0)
MCV: 88.4 fL (ref 80.0–100.0)
Platelets: 361 10*3/uL (ref 150–400)
RBC: 4.57 MIL/uL (ref 4.22–5.81)
RDW: 15.9 % — ABNORMAL HIGH (ref 11.5–15.5)
WBC: 11.4 10*3/uL — ABNORMAL HIGH (ref 4.0–10.5)
nRBC: 0 % (ref 0.0–0.2)

## 2023-08-07 LAB — BASIC METABOLIC PANEL
Anion gap: 10 (ref 5–15)
BUN: 43 mg/dL — ABNORMAL HIGH (ref 6–20)
CO2: 19 mmol/L — ABNORMAL LOW (ref 22–32)
Calcium: 8.1 mg/dL — ABNORMAL LOW (ref 8.9–10.3)
Chloride: 104 mmol/L (ref 98–111)
Creatinine, Ser: 1.73 mg/dL — ABNORMAL HIGH (ref 0.61–1.24)
GFR, Estimated: 45 mL/min — ABNORMAL LOW (ref >60)
Glucose, Bld: 160 mg/dL — ABNORMAL HIGH (ref 70–99)
Potassium: 4.2 mmol/L (ref 3.5–5.1)
Sodium: 133 mmol/L — ABNORMAL LOW (ref 135–145)

## 2023-08-07 LAB — GLUCOSE, CAPILLARY: Glucose-Capillary: 199 mg/dL — ABNORMAL HIGH (ref 70–99)

## 2023-08-07 MED ORDER — LOSARTAN POTASSIUM 25 MG PO TABS
25.0000 mg | ORAL_TABLET | Freq: Every day | ORAL | Status: DC
  Administered 2023-08-07: 25 mg via ORAL
  Filled 2023-08-07: qty 1

## 2023-08-07 MED ORDER — CARVEDILOL 3.125 MG PO TABS
3.1250 mg | ORAL_TABLET | Freq: Two times a day (BID) | ORAL | Status: DC
  Administered 2023-08-07: 3.125 mg via ORAL
  Filled 2023-08-07: qty 1

## 2023-08-07 MED ORDER — CARVEDILOL 3.125 MG PO TABS
3.1250 mg | ORAL_TABLET | Freq: Two times a day (BID) | ORAL | 0 refills | Status: DC
Start: 2023-08-07 — End: 2023-08-19
  Filled 2023-08-07: qty 60, 30d supply, fill #0

## 2023-08-07 MED ORDER — FUROSEMIDE 20 MG PO TABS
20.0000 mg | ORAL_TABLET | ORAL | 0 refills | Status: DC | PRN
  Filled 2023-08-07: qty 30, 30d supply, fill #0

## 2023-08-07 MED ORDER — SODIUM BICARBONATE 650 MG PO TABS
650.0000 mg | ORAL_TABLET | Freq: Two times a day (BID) | ORAL | 0 refills | Status: DC
  Filled 2023-08-07: qty 30, 15d supply, fill #0

## 2023-08-07 MED ORDER — GUAIFENESIN-DM 100-10 MG/5ML PO SYRP
10.0000 mL | ORAL_SOLUTION | ORAL | 1 refills | Status: DC | PRN
  Filled 2023-08-07: qty 236, 4d supply, fill #0

## 2023-08-07 MED ORDER — FUROSEMIDE 20 MG PO TABS: 20.0000 mg | ORAL_TABLET | ORAL | Status: DC | PRN

## 2023-08-07 MED ORDER — LOSARTAN POTASSIUM 25 MG PO TABS
25.0000 mg | ORAL_TABLET | Freq: Every day | ORAL | 0 refills | Status: DC
  Filled 2023-08-07: qty 30, 30d supply, fill #0

## 2023-08-07 MED ORDER — GUAIFENESIN-DM 100-10 MG/5ML PO SYRP
10.0000 mL | ORAL_SOLUTION | ORAL | Status: DC | PRN
  Administered 2023-08-07: 10 mL via ORAL
  Filled 2023-08-07: qty 10

## 2023-08-07 NOTE — Discharge Summary (Signed)
Physician Discharge Summary  Jordan Taylor GEX:[id number] DOB: Sep 19, 1966 DOA: 08/04/2023  PCP: Jordan Taylor  Admit date: 08/04/2023 Discharge date: 08/07/2023  Admitted From: Home Disposition: Home  Recommendations for Outpatient Follow-up:  Follow up with PCP in 1 week with repeat CBC/BMP Outpatient follow-up with cardiology Follow up in ED if symptoms worsen or new appear   Home Health: No Equipment/Devices: None  Discharge Condition: Stable CODE STATUS: Full Diet recommendation: Heart healthy/carb modified/fluid restriction of up to 1200 cc a day  Brief/Interim Summary: 57 y.o.  male with history of HTN, DM-2, chronic diastolic heart failure, CKD stage IIIb presented with exertional dyspnea, orthopnea and worsening dyspnea at rest.  On presentation, he was hypoxic, initially requiring BiPAP.  He had diffuse interstitial infiltrates and tested positive for COVID.  He was  presumed to have either decompensated heart failure or COVID pneumonitis-started on steroids/diuretics and subsequently admitted to the hospitalist service.  Patient rapidly improved with diuretics-transitioned to room air the very next day with significant parenchymal clearing on repeat chest x-ray as well-consistent with decompensated heart failure rather than COVID-19 pneumonitis.  Echo subsequently revealed new onset systolic heart failure.  Cardiology was consulted.  He underwent cardiac catheterization on 08/06/2023.  Cardiology has cleared the patient for discharge home today.  Discharge patient home today with outpatient follow-up with PCP and cardiology.  Discharge Diagnoses:   Acute respiratory failure with hypoxia New onset acute systolic heart failure Hypertension -Echo showed EF of 30 to 35%.  Treated with diuretics as per cardiology recommendations.  Cardiac catheterization on 08/06/2023 revealed normal coronaries -Currently on room air.  Much improved.  Wants to go home today.  Cardiology has cleared  the patient for discharge home today.  Discharge home on oral Coreg, losartan along with as needed Lasix.  Benazepril and amlodipine have been discontinued by cardiology.  Outpatient follow-up with cardiology.  COVID-19 infection -Possibly incidental finding.  Steroids discontinued on 08/05/2023. -Continue total 10-day of isolation  CKD stage IIIb -Creatinine close to baseline.  Monitor as an outpatient  Acute non-anion gap metabolic acidosis -Continue sodium bicarbonate supplementation.  Outpatient follow-up   Diabetes mellitus type 2 with hyperglycemia -A1c 9.6 on 08/04/2023.  Continue carb modified diet and home long-acting insulin.  Outpatient follow-up with PCP  BPH -Continue Flomax  Obesity -Outpatient follow-up  Discharge Instructions  Discharge Instructions     Ambulatory referral to Cardiology   Complete by: As directed    Diet - low sodium heart healthy   Complete by: As directed    Diet Carb Modified   Complete by: As directed    Increase activity slowly   Complete by: As directed       Allergies as of 08/07/2023       Reactions   Amoxicillin Swelling   Atorvastatin Other (See Comments)   Severe muscle pain/Weakness   Penicillins Swelling        Medication List     STOP taking these medications    amLODipine 5 MG tablet Commonly known as: NORVASC   benazepril 20 MG tablet Commonly known as: LOTENSIN   Flovent Diskus 50 MCG/ACT Aepb Generic drug: Fluticasone Propionate (Inhal)   metoprolol tartrate 25 MG tablet Commonly known as: LOPRESSOR       TAKE these medications    albuterol 108 (90 Base) MCG/ACT inhaler Commonly known as: VENTOLIN HFA Inhale 2 puffs into the lungs every 6 (six) hours as needed for wheezing.   carvedilol 3.125 MG tablet Commonly known as: COREG  Take 1 tablet (3.125 mg total) by mouth 2 (two) times daily with a meal.   furosemide 20 MG tablet Commonly known as: LASIX Take 1 tablet (20 mg total) by mouth as needed  for edema.   guaiFENesin-dextromethorphan 100-10 MG/5ML syrup Commonly known as: ROBITUSSIN DM Take 10 mLs by mouth every 4 (four) hours as needed for cough.   Jardiance 10 MG Tabs tablet Generic drug: empagliflozin Take 10 mg by mouth daily.   losartan 25 MG tablet Commonly known as: COZAAR Take 1 tablet (25 mg total) by mouth daily.   sodium bicarbonate 650 MG tablet Take 1 tablet (650 mg total) by mouth 2 (two) times daily.   tamsulosin 0.4 MG Caps capsule Commonly known as: FLOMAX TAKE ONE CAPSULE BY MOUTH DAILY AFTER BREAKFAST   Trelegy Ellipta 100-62.5-25 MCG/ACT Aepb Generic drug: Fluticasone-Umeclidin-Vilant Inhale 1 puff into the lungs daily.   Evaristo Bury FlexTouch 100 UNIT/ML FlexTouch Pen Generic drug: insulin degludec Inject 15 Units into the skin daily.        Follow-up Information     Jordan Taylor. Schedule an appointment as soon as possible for a visit in 1 week(s).   Specialty: Internal Medicine Why: with repeat cbc/bmp Contact information: [address] [place] [address] [phone]                Allergies  Allergen Reactions   Amoxicillin Swelling   Atorvastatin Other (See Comments)    Severe muscle pain/Weakness   Penicillins Swelling    Consultations: Cardiology   Procedures/Studies: CARDIAC CATHETERIZATION  Result Date: 08/06/2023 No angiographic evidence of CAD RA 4 RV 39/5/9 PA 36/20 mean 27 PCWP 12 LV 94/26/12 AO 105/70 CO 7.13 L/min CI 3.4 Recommendations: Medical management of non-ischemic cardiomyopathy.   VAS [place] LOWER EXTREMITY VENOUS (DVT)  Result Date: 08/06/2023  Lower Venous DVT Study Patient Name:  Jordan Taylor  Date of Exam:   08/05/2023 Medical Rec #: [id number]          Accession #:    [id number] Date of Birth: 1966-06-08          Patient Gender: M Patient Age:   57 years Exam Location:  [facility] Procedure:      VAS [place] LOWER EXTREMITY VENOUS (DVT) Referring Phys: Jordan Taylor  --------------------------------------------------------------------------------  Indications: Swelling, and Edema.  Risk Factors: None identified. Anticoagulation: Heparin. Comparison Study: No prior study Performing Technologist: Jordan Taylor  Examination Guidelines: A complete evaluation includes B-mode imaging, spectral Doppler, color Doppler, and power Doppler as needed of all accessible portions of each vessel. Bilateral testing is considered an integral part of a complete examination. Limited examinations for reoccurring indications may be performed as noted. The reflux portion of the exam is performed with the patient in reverse Trendelenburg.  +---------+---------------+---------+-----------+----------+--------------+ RIGHT    CompressibilityPhasicitySpontaneityPropertiesThrombus Aging +---------+---------------+---------+-----------+----------+--------------+ CFV      Full           Yes      Yes                                 +---------+---------------+---------+-----------+----------+--------------+ SFJ      Full                                                        +---------+---------------+---------+-----------+----------+--------------+  FV Prox  Full                                                        +---------+---------------+---------+-----------+----------+--------------+ FV Mid   Full                                                        +---------+---------------+---------+-----------+----------+--------------+ FV DistalFull                                                        +---------+---------------+---------+-----------+----------+--------------+ PFV      Full                                                        +---------+---------------+---------+-----------+----------+--------------+ POP      Full           Yes      Yes                                  +---------+---------------+---------+-----------+----------+--------------+ PTV      Full                                                        +---------+---------------+---------+-----------+----------+--------------+ PERO     Full                                                        +---------+---------------+---------+-----------+----------+--------------+   +---------+---------------+---------+-----------+----------+--------------+ LEFT     CompressibilityPhasicitySpontaneityPropertiesThrombus Aging +---------+---------------+---------+-----------+----------+--------------+ CFV      Full           Yes      Yes                                 +---------+---------------+---------+-----------+----------+--------------+ SFJ      Full                                                        +---------+---------------+---------+-----------+----------+--------------+ FV Prox  Full                                                        +---------+---------------+---------+-----------+----------+--------------+  FV Mid   Full                                                        +---------+---------------+---------+-----------+----------+--------------+ FV DistalFull                                                        +---------+---------------+---------+-----------+----------+--------------+ PFV      Full                                                        +---------+---------------+---------+-----------+----------+--------------+ POP      Full           Yes      Yes                                 +---------+---------------+---------+-----------+----------+--------------+ PTV      Full                                                        +---------+---------------+---------+-----------+----------+--------------+ PERO     Full                                                         +---------+---------------+---------+-----------+----------+--------------+     Summary: BILATERAL: - No evidence of deep vein thrombosis seen in the lower extremities, bilaterally. -No evidence of popliteal cyst, bilaterally.   *See table(s) above for measurements and observations. Electronically signed by Gerarda Fraction on 08/06/2023 at 8:42:51 AM.    Final    ECHOCARDIOGRAM COMPLETE  Result Date: 08/05/2023    ECHOCARDIOGRAM REPORT   Patient Name:   JUNNIE AKEL Date of Exam: 08/05/2023 Medical Rec #:  [id number]         Height:       65.0 in Accession #:    [id number]        Weight:       218.2 lb Date of Birth:  June 28, 1966         BSA:          2.053 m Patient Age:    57 years          BP:           129/88 mmHg Patient Gender: M                 HR:           110 bpm. Exam Location:  Inpatient Procedure: 2D Echo, Cardiac Doppler, Color Doppler, 3D Echo and Intracardiac            Opacification Agent Indications:  CHF-Acute Diastolic I50.31  History:        Patient has prior history of Echocardiogram examinations, most                 recent 11/26/2021. CHF; Risk Factors:Dyslipidemia, Diabetes,                 Hypertension and Former Smoker. H/O COVID.  Sonographer:    Dondra Prader RVT RCS Referring Phys: [phone] AMRIT ADHIKARI IMPRESSIONS  1. Left ventricular ejection fraction, by estimation, is 30 to 35%. The left ventricle has moderately decreased function. The left ventricle demonstrates global hypokinesis. The left ventricular internal cavity size was mildly dilated. Left ventricular diastolic parameters are indeterminate.  2. Right ventricular systolic function is normal. The right ventricular size is normal.  3. The mitral valve is normal in structure. Trivial mitral valve regurgitation. No evidence of mitral stenosis.  4. The aortic valve is tricuspid. Aortic valve regurgitation is not visualized. No aortic stenosis is present.  5. The inferior vena cava is normal in size with greater than 50%  respiratory variability, suggesting right atrial pressure of 3 mmHg. FINDINGS  Left Ventricle: Left ventricular ejection fraction, by estimation, is 30 to 35%. The left ventricle has moderately decreased function. The left ventricle demonstrates global hypokinesis. Definity contrast agent was given IV to delineate the left ventricular endocardial borders. The left ventricular internal cavity size was mildly dilated. There is no left ventricular hypertrophy. Abnormal (paradoxical) septal motion, consistent with left bundle branch block. Left ventricular diastolic parameters  are indeterminate. Right Ventricle: The right ventricular size is normal. Right ventricular systolic function is normal. Left Atrium: Left atrial size was normal in size. Right Atrium: Right atrial size was normal in size. Pericardium: Trivial pericardial effusion is present. Mitral Valve: The mitral valve is normal in structure. Trivial mitral valve regurgitation. No evidence of mitral valve stenosis. Tricuspid Valve: The tricuspid valve is normal in structure. Tricuspid valve regurgitation is not demonstrated. No evidence of tricuspid stenosis. Aortic Valve: The aortic valve is tricuspid. Aortic valve regurgitation is not visualized. No aortic stenosis is present. Aortic valve mean gradient measures 2.0 mmHg. Aortic valve peak gradient measures 4.0 mmHg. Aortic valve area, by VTI measures 3.28 cm. Pulmonic Valve: The pulmonic valve was normal in structure. Pulmonic valve regurgitation is not visualized. No evidence of pulmonic stenosis. Aorta: The aortic root is normal in size and structure. Venous: The inferior vena cava is normal in size with greater than 50% respiratory variability, suggesting right atrial pressure of 3 mmHg. IAS/Shunts: No atrial level shunt detected by color flow Doppler.  LEFT VENTRICLE PLAX 2D LVIDd:         6.05 cm LVIDs:         4.65 cm LV PW:         0.95 cm LV IVS:        0.85 cm LVOT diam:     2.10 cm   3D Volume  EF: LV SV:         50        3D EF:        48 % LV SV Index:   24        LV EDV:       156 ml LVOT Area:     3.46 cm  LV ESV:       82 ml  LV SV:        75 ml RIGHT VENTRICLE             IVC RV Basal diam:  4.10 cm     IVC diam: 1.90 cm RV S prime:     10.80 cm/s TAPSE (M-mode): 2.2 cm LEFT ATRIUM             Index        RIGHT ATRIUM           Index LA diam:        3.70 cm 1.80 cm/m   RA Area:     18.10 cm LA Vol (A2C):   53.4 ml 26.01 ml/m  RA Volume:   49.90 ml  24.31 ml/m LA Vol (A4C):   50.8 ml 24.75 ml/m LA Biplane Vol: 54.7 ml 26.65 ml/m  AORTIC VALVE                    PULMONIC VALVE AV Area (Vmax):    3.43 cm     PV Vmax:       0.72 m/s AV Area (Vmean):   3.40 cm     PV Peak grad:  2.0 mmHg AV Area (VTI):     3.28 cm AV Vmax:           100.00 cm/s AV Vmean:          65.800 cm/s AV VTI:            0.152 m AV Peak Grad:      4.0 mmHg AV Mean Grad:      2.0 mmHg LVOT Vmax:         99.10 cm/s LVOT Vmean:        64.500 cm/s LVOT VTI:          0.144 m LVOT/AV VTI ratio: 0.95  AORTA Ao Root diam: 3.20 cm Ao Asc diam:  3.30 cm MITRAL VALVE MV Area (PHT): 5.66 cm     SHUNTS MV Decel Time: 134 msec     Systemic VTI:  0.14 m MV E velocity: 145.00 cm/s  Systemic Diam: 2.10 cm Olga Millers Taylor Electronically signed by Olga Millers Taylor Signature Date/Time: 08/05/2023/1:22:52 PM    Final    DG Chest Port 1V same Day  Result Date: 08/05/2023 CLINICAL DATA:  Shortness of breath. EXAM: PORTABLE CHEST 1 VIEW COMPARISON:  August 04, 2023. FINDINGS: Stable cardiomegaly. Both lungs are clear. The visualized skeletal structures are unremarkable. IMPRESSION: No active disease. Electronically Signed   By: Lupita Raider M.D.   On: 08/05/2023 08:02   DG Chest Port 1 View  Result Date: 08/04/2023 CLINICAL DATA:  Shortness of breath EXAM: PORTABLE CHEST 1 VIEW COMPARISON:  11/10/2021 FINDINGS: Mild cardiomegaly. Interstitial prominence throughout the lungs, likely interstitial edema. No  effusions or acute bony abnormality. IMPRESSION: Cardiomegaly with interstitial prominence, likely interstitial edema. Electronically Signed   By: Charlett Nose M.D.   On: 08/04/2023 03:40      Subjective: Patient seen and examined at bedside.  Feels much better and feels okay to go home today.  Denies any chest pain, nausea, vomiting.  Discharge Exam: Vitals:   08/07/23 0505 08/07/23 0807  BP: 138/85 123/82  Pulse: (!) 108 (!) 114  Resp: 18 18  Temp: 97.9 F (36.6 C) 97.8 F (36.6 C)  SpO2: 94% 95%    General: Pt is alert, awake, not in acute distress Cardiovascular: Mild intermittent tachycardia present, S1/S2 + Respiratory:  bilateral decreased breath sounds at bases with scattered crackles Abdominal: Soft, obese, NT, ND, bowel sounds + Extremities: Trace lower extremity edema; no cyanosis    The results of significant diagnostics from this hospitalization (including imaging, microbiology, ancillary and laboratory) are listed below for reference.     Microbiology: Recent Results (from the past 240 hour(s))  SARS Coronavirus 2 by RT PCR (hospital order, performed in [facility] hospital lab) *cepheid single result test* Anterior Nasal Swab     Status: Abnormal   Collection Time: 08/04/23  3:59 AM   Specimen: Anterior Nasal Swab  Result Value Ref Range Status   SARS Coronavirus 2 by RT PCR POSITIVE (A) NEGATIVE Final    Comment: Performed at [facility], [address], [place], [place] [address]     Labs: BNP (last 3 results) Recent Labs    08/04/23 0327  BNP 754.7*   Basic Metabolic Panel: Recent Labs  Lab 08/04/23 0327 08/04/23 0333 08/04/23 1519 08/05/23 0409 08/06/23 0417 08/06/23 1452 08/06/23 1455 08/07/23 0251  NA 137   < > 135 134* 134* 146* 143 133*  K 5.4*   < > 4.7 5.0 4.5 3.3* 3.6 4.2  CL 105  --  105 104 103  --   --  104  CO2 17*  --  17* 20* 20*  --   --  19*  GLUCOSE 227*  --  305* 284* 352*  --   --  160*  BUN 29*  --  34* 43* 54*   --   --  43*  CREATININE 1.96*  --  2.11* 2.19* 1.89*  --   --  1.73*  CALCIUM 8.7*  --  8.6* 8.5* 8.3*  --   --  8.1*   < > = values in this interval not displayed.   Liver Function Tests: Recent Labs  Lab 08/05/23 0409  AST 16  ALT 50*  ALKPHOS 187*  BILITOT 0.9  PROT 6.3*  ALBUMIN 3.1*   No results for input(s): "LIPASE", "AMYLASE" in the last 168 hours. No results for input(s): "AMMONIA" in the last 168 hours. CBC: Recent Labs  Lab 08/04/23 0327 08/04/23 0333 08/05/23 0409 08/06/23 0417 08/06/23 1452 08/06/23 1455 08/07/23 0251  WBC 20.6*  --  18.8* 21.2*  --   --  11.4*  HGB 14.2   < > 12.9* 12.8* 12.2* 13.3 13.1  HCT 43.0   < > 38.5* 37.4* 36.0* 39.0 40.4  MCV 87.9  --  87.9 88.2  --   --  88.4  PLT 435*  --  380 377  --   --  361   < > = values in this interval not displayed.   Cardiac Enzymes: No results for input(s): "CKTOTAL", "CKMB", "CKMBINDEX", "TROPONINI" in the last 168 hours. BNP: Invalid input(s): "POCBNP" CBG: Recent Labs  Lab 08/06/23 0810 08/06/23 1239 08/06/23 1547 08/06/23 2134 08/07/23 0607  GLUCAP 224* 139* 72 305* 199*   D-Dimer No results for input(s): "DDIMER" in the last 72 hours. Hgb A1c No results for input(s): "HGBA1C" in the last 72 hours. Lipid Profile No results for input(s): "CHOL", "HDL", "LDLCALC", "TRIG", "CHOLHDL", "LDLDIRECT" in the last 72 hours. Thyroid function studies No results for input(s): "TSH", "T4TOTAL", "T3FREE", "THYROIDAB" in the last 72 hours.  Invalid input(s): "FREET3" Anemia work up No results for input(s): "VITAMINB12", "FOLATE", "FERRITIN", "TIBC", "IRON", "RETICCTPCT" in the last 72 hours. Urinalysis    Component Value Date/Time   COLORURINE COLORLESS (A) 11/11/2021  0138   APPEARANCEUR CLEAR 11/11/2021 0138   LABSPEC 1.025 11/11/2021 0138   PHURINE 5.0 11/11/2021 0138   GLUCOSEU >1,000 (A) 11/11/2021 0138   HGBUR TRACE (A) 11/11/2021 0138   BILIRUBINUR NEGATIVE 11/11/2021 0138    BILIRUBINUR neg 03/20/2019 1329   KETONESUR NEGATIVE 11/11/2021 0138   PROTEINUR TRACE (A) 11/11/2021 0138   UROBILINOGEN 0.2 03/20/2019 1329   NITRITE NEGATIVE 11/11/2021 0138   LEUKOCYTESUR NEGATIVE 11/11/2021 0138   Sepsis Labs Recent Labs  Lab 08/04/23 0327 08/05/23 0409 08/06/23 0417 08/07/23 0251  WBC 20.6* 18.8* 21.2* 11.4*   Microbiology Recent Results (from the past 240 hour(s))  SARS Coronavirus 2 by RT PCR (hospital order, performed in [facility] hospital lab) *cepheid single result test* Anterior Nasal Swab     Status: Abnormal   Collection Time: 08/04/23  3:59 AM   Specimen: Anterior Nasal Swab  Result Value Ref Range Status   SARS Coronavirus 2 by RT PCR POSITIVE (A) NEGATIVE Final    Comment: Performed at [facility], [address], [place], [place] [address]     Time coordinating discharge: 35 minutes  SIGNED:   Glade Lloyd, Taylor  [facility] 08/07/2023, 10:15 AM

## 2023-08-07 NOTE — Progress Notes (Signed)
Patient Name: Jordan Taylor Date of Encounter: 08/07/2023 [facility] Cardiologist: Marice Angelino [place], MD   Interval Summary  .    57 yr old male with PMH of HTN, type 2 DM, HLD, CKD IIIb, pericardial effusion, who is admitted for acute systolic heart failure, incidentally found COVID-19 positive,    Ready for d/c cath yesterday with normal filling pressures no CAD   Vital Signs .    Vitals:   08/06/23 2019 08/07/23 0014 08/07/23 0505 08/07/23 0807  BP: 136/74 123/81 138/85 123/82  Pulse: (!) 106 (!) 103 (!) 108 (!) 114  Resp: 19 20 18 18   Temp: 97.7 F (36.5 C) 97.9 F (36.6 C) 97.9 F (36.6 C) 97.8 F (36.6 C)  TempSrc: Oral Oral Oral Oral  SpO2: 93% 99% 94% 95%  Weight:   96.3 kg   Height:        Intake/Output Summary (Last 24 hours) at 08/07/2023 0930 Last data filed at 08/06/2023 1800 Gross per 24 hour  Intake 368.36 ml  Output 1075 ml  Net -706.64 ml      08/07/2023    5:05 AM 08/06/2023    6:30 PM 08/03/2023    1:33 PM  Last 3 Weights  Weight (lbs) 212 lb 4.9 oz 216 lb 14.9 oz 218 lb 3.2 oz  Weight (kg) 96.3 kg 98.4 kg 98.975 kg      Telemetry/ECG    Sinus rhythm  - Personally Reviewed  Echo from 08/05/23:   1. Left ventricular ejection fraction, by estimation, is 30 to 35%. The  left ventricle has moderately decreased function. The left ventricle  demonstrates global hypokinesis. The left ventricular internal cavity size  was mildly dilated. Left ventricular  diastolic parameters are indeterminate.   2. Right ventricular systolic function is normal. The right ventricular  size is normal.   3. The mitral valve is normal in structure. Trivial mitral valve  regurgitation. No evidence of mitral stenosis.   4. The aortic valve is tricuspid. Aortic valve regurgitation is not  visualized. No aortic stenosis is present.   5. The inferior vena cava is normal in size with greater than 50%  respiratory variability, suggesting right atrial pressure of 3  mmHg.     Physical Exam .   BP 123/82 (BP Location: Left Arm)   Pulse (!) 114   Temp 97.8 F (36.6 C) (Oral)   Resp 18   Ht 5\' 5"  (1.651 m)   Wt 96.3 kg   SpO2 95%   BMI 35.33 kg/m    GEN: No acute distress.   Neck: No JVD Cardiac: RRR, no murmurs, rubs, or gallops.  Respiratory: Clear to auscultation bilaterally. On room air.  GI: Soft, nontender, non-distended  MS: No leg edema Cath site A no hematoma RFV and right radial   Assessment & Plan .     Acute systolic heart failure - presented with few months of DOE and orthopnea that progressively worsened over the last few days - BNP 754, Hs trop 41 x1, improved 1.73 K 4.2  - TTE EF 30-35%  - cath 9/6 no CAD PCWP 12 CO 7.1 no low output  - on jardiance add low dose coreg and ARB especially given DM - will arrange outpatient f/u Dr Swaziland BMET in a week consider adding low dose aldactone as outpatient. Given renal failure and normal PCWP d/c with just PRN lasix 20 mg for dyspnea or weight gain more than 2 lbs Coreg ok as CO is in  normal range    Acute hypoxic respiratory failure  COVID-19 infection  CKD IIIb HTN Type 2 DM  - per primary team       Informed Consent   Shared Decision Making/Informed Consent  The risks [stroke (1 in 1000), death (1 in 1000), kidney failure [usually temporary] (1 in 500), bleeding (1 in 200), allergic reaction [possibly serious] (1 in 200)], benefits (diagnostic support and management of coronary artery disease) and alternatives of a cardiac catheterization were discussed in detail with Mr. Wagenbach and he is willing to proceed.       For questions or updates, please contact [facility] Please consult [email or web address] for contact info under        Signed, Charlton Haws, MD    57 year old with acute systolic heart failure.  Has had orthopnea at home for the last several days, progressive symptoms over weeks to months.  Ejection fraction 30 to 35% on echocardiogram.   Diabetic with chronic kidney disease stage IIIb.  We are going to proceed with heart catheterization.  Discussed with Dr. Clifton James.  Understand creatinine of approximately 2.  We need to exclude the possibility of triple-vessel disease.  Appreciate their assistance.  This will be helpful for longitudinal care.  COVID-positive however not receiving any specific therapies for COVID secondary to lack of COVID symptoms.  His shortness of breath/orthopnea has improved with IV Lasix.  White count was elevated however improving.  Partial reactivity.  Charlton Haws, MD

## 2023-08-09 ENCOUNTER — Encounter (HOSPITAL_COMMUNITY): Payer: Self-pay | Admitting: Cardiovascular Disease

## 2023-08-10 LAB — LIPOPROTEIN A (LPA): Lipoprotein (a): 56.2 nmol/L — ABNORMAL HIGH (ref <75.0)

## 2023-08-16 NOTE — Progress Notes (Unsigned)
Cardiology Clinic Note   Patient Name: Jordan Taylor Date of Encounter: 08/19/2023  Primary Care Provider:  Fleet Contras, MD Primary Cardiologist:  Peter Swaziland, MD  Patient Profile    57 y.o. male with history of HTN, DM-2, chronic diastolic heart failure, CKD stage IIIb admitted with COVID and decompensated CHF from 08/04/2023-08/07/2023. RHC/LHC 08/06/2023 no angiographic evidence of CAD,  PA 36/20 Mean 27.  Medical management was recommended for NICM. He is on a 1200 cc fluid restriction  Discharge home on oral Coreg, losartan along with as needed Lasix.  Benazepril and amlodipine have been discontinued by cardiology.  Outpatient follow-up with cardiology.   Past Medical History    Past Medical History:  Diagnosis Date   Essential hypertension 12/04/2013   Hyperlipidemia    Type 2 diabetes mellitus (HCC) 12/04/2013   Past Surgical History:  Procedure Laterality Date   ANTERIOR CERVICAL DECOMPRESSION/DISCECTOMY FUSION 4 LEVELS N/A 09/07/2016   Procedure: Anterior Cervical Diskectomy Fusion - Cervical three- Cervical four - Cervical four - Cervical five - Cervical five- Cervical six  - Cervical six - Cervical seven;  Surgeon: Julio Sicks, MD;  Location: [facility] OR;  Service: Neurosurgery;  Laterality: N/A;   CERVICAL SPINE SURGERY N/A    KNEE ARTHROSCOPY Left    RIGHT/LEFT HEART CATH AND CORONARY ANGIOGRAPHY N/A 08/06/2023   Procedure: RIGHT/LEFT HEART CATH AND CORONARY ANGIOGRAPHY;  Surgeon: Kathleene Hazel, MD;  Location: [facility] INVASIVE CV LAB;  Service: Cardiovascular;  Laterality: N/A;   spinal cord surgery     20+ yrs ago    Allergies  Allergies  Allergen Reactions   Amoxicillin Swelling   Atorvastatin Other (See Comments)    Severe muscle pain/Weakness   Penicillins Swelling    History of Present Illness    He comes today feeling much better, breathing status is significantly improved.  Heart rate has significantly improved.  He is over COVID infection.   Echocardiogram revealing an EF of 30 to 35% during hospitalization.  He was not placed on Entresto with a creatinine of 1.7, however this has been trending down from a creatinine of greater than 2.  He is weighing himself daily.  He is doing his best to avoid salt.  He is monitoring his glucose.  He still is having addiction to Pepsi[employer] and colas which she is trying to wean himself down from.  Discharge weight 212 pounds.  Blood pressure and weight have gone up since being's discharged.  Today weight is 221 pounds, blood pressure 128/82.  He has been medically compliant.  He still feels tired.  He is reading a lot now and doing his best to be better informed concerning heart failure.  Home Medications    Current Outpatient Medications  Medication Sig Dispense Refill   albuterol (VENTOLIN HFA) 108 (90 Base) MCG/ACT inhaler Inhale 2 puffs into the lungs every 6 (six) hours as needed for wheezing. 2 each 11   carvedilol (COREG) 6.25 MG tablet Take 1 tablet (6.25 mg total) by mouth 2 (two) times daily. 180 tablet 3   empagliflozin (JARDIANCE) 10 MG TABS tablet Take 10 mg by mouth daily.     ferrous fumarate (HEMOCYTE - 106 MG FE) 325 (106 Fe) MG TABS tablet Take 1 tablet by mouth daily.     losartan (COZAAR) 25 MG tablet Take 1 tablet (25 mg total) by mouth daily. 30 tablet 0   sodium bicarbonate 650 MG tablet Take 1 tablet (650 mg total) by mouth 2 (two) times daily. 30  tablet 0   tamsulosin (FLOMAX) 0.4 MG CAPS capsule TAKE ONE CAPSULE BY MOUTH DAILY AFTER BREAKFAST 30 capsule 2   TRELEGY ELLIPTA 100-62.5-25 MCG/ACT AEPB Inhale 1 puff into the lungs daily.     TRESIBA FLEXTOUCH 100 UNIT/ML FlexTouch Pen Inject 15 Units into the skin daily.     VITAMIN D PO Take by mouth daily.     VITAMIN E PO Take by mouth daily.     furosemide (LASIX) 20 MG tablet Take 1 tablet (20 mg total) by mouth as needed for fluid or edema (weight gain of  3 pounds over night or 5 pounds in a week/). 30 tablet 6    guaiFENesin-dextromethorphan (ROBITUSSIN DM) 100-10 MG/5ML syrup Take 10 mLs by mouth every 4 (four) hours as needed for cough. (Patient not taking: Reported on 08/19/2023) 236 mL 1   No current facility-administered medications for this visit.     Family History    Family History  Problem Relation Age of Onset   Alcohol abuse Mother    Cancer Mother    Alcohol abuse Father    Diabetes Father    Hypertension Father    Cancer Sister    Cancer Other    He indicated that his mother is deceased. He indicated that his father is deceased. He indicated that only one of his two sisters is alive. He indicated that his brother is alive. He indicated that the status of his other is unknown.  Social History    Social History   Socioeconomic History   Marital status: Divorced    Spouse name: [name]   Number of children: 5   Years of education: College   Highest education level: Not on file  Occupational History   Occupation: Clinical cytogeneticist: Artist  Tobacco Use   Smoking status: Former   Smokeless tobacco: Never  Advertising account planner   Vaping status: Never Used  Substance and Sexual Activity   Alcohol use: No    Alcohol/week: 0.0 standard drinks of alcohol   Drug use: No    Comment: use CBD   Sexual activity: Yes    Partners: Female  Other Topics Concern   Not on file  Social History Narrative   Lives at home w/ his wife   Right-handed   Drinks about 10 cans of soda per day      Engineer, water.    Social Determinants of Health   Financial Resource Strain: Not on file  Food Insecurity: Patient Declined (08/05/2023)   Hunger Vital Sign    Worried About Running Out of Food in the Last Year: Patient declined    Ran Out of Food in the Last Year: Patient declined  Transportation Needs: Patient Declined (08/05/2023)   PRAPARE - Administrator, Civil Service (Medical): Patient declined    Lack of Transportation (Non-Medical): Patient declined   Physical Activity: Not on file  Stress: Not on file  Social Connections: Unknown (07/29/2023)   Received from [facility]   Social Network    Social Network: Not on file  Intimate Partner Violence: Patient Declined (08/05/2023)   Humiliation, Afraid, Rape, and Kick questionnaire    Fear of Current or Ex-Partner: Patient declined    Emotionally Abused: Patient declined    Physically Abused: Patient declined    Sexually Abused: Patient declined     Review of Systems    General:  No chills, fever, night sweats or weight changes.  Cardiovascular:  No chest pain, improved dyspnea on exertion, edema, orthopnea, palpitations, paroxysmal nocturnal dyspnea. Dermatological: No rash, lesions/masses Respiratory: No cough, dyspnea Urologic: No hematuria, dysuria Abdominal:   No nausea, vomiting, diarrhea, bright red blood per rectum, melena, or hematemesis Neurologic:  No visual changes, wkns, changes in mental status. All other systems reviewed and are otherwise negative except as noted above.       Physical Exam    VS:  BP 128/82 (BP Location: Left Arm, Patient Position: Sitting, Cuff Size: Normal)   Pulse 83   Ht 5\' 5"  (1.651 m)   Wt 221 lb 3.2 oz (100.3 kg)   SpO2 93%   BMI 36.81 kg/m  , BMI Body mass index is 36.81 kg/m.     GEN: Well nourished, well developed, in no acute distress. HEENT: normal. Neck: Supple, no JVD, carotid bruits, or masses. Cardiac: RRR, tachycardic, no murmurs, rubs, or gallops. No clubbing, cyanosis, edema.  Radials/DP/PT 2+ and equal bilaterally.  Respiratory:  Respirations regular and unlabored, clear to auscultation bilaterally. GI: Soft, nontender, nondistended, BS + x 4. MS: no deformity or atrophy. Skin: warm and dry, no rash. Neuro:  Strength and sensation are intact. Psych: Normal affect.      Lab Results  Component Value Date   WBC 11.4 (H) 08/07/2023   HGB 13.1 08/07/2023   HCT 40.4 08/07/2023   MCV 88.4 08/07/2023   PLT 361  08/07/2023   Lab Results  Component Value Date   CREATININE 1.73 (H) 08/07/2023   BUN 43 (H) 08/07/2023   NA 133 (L) 08/07/2023   K 4.2 08/07/2023   CL 104 08/07/2023   CO2 19 (L) 08/07/2023   Lab Results  Component Value Date   ALT 50 (H) 08/05/2023   AST 16 08/05/2023   ALKPHOS 187 (H) 08/05/2023   BILITOT 0.9 08/05/2023   Lab Results  Component Value Date   CHOL 161 02/05/2022   HDL 40 02/05/2022   LDLCALC 96 02/05/2022   TRIG 155 (H) 02/05/2022   CHOLHDL 4.0 02/05/2022    Lab Results  Component Value Date   HGBA1C 7.6 (H) 08/04/2023     Review of Prior Studies   Echocardiogram 08/05/2023 (in hospital).  1. Left ventricular ejection fraction, by estimation, is 30 to 35%. The  left ventricle has moderately decreased function. The left ventricle  demonstrates global hypokinesis. The left ventricular internal cavity size  was mildly dilated. Left ventricular  diastolic parameters are indeterminate.   2. Right ventricular systolic function is normal. The right ventricular  size is normal.   3. The mitral valve is normal in structure. Trivial mitral valve  regurgitation. No evidence of mitral stenosis.   4. The aortic valve is tricuspid. Aortic valve regurgitation is not  visualized. No aortic stenosis is present.   5. The inferior vena cava is normal in size with greater than 50%  respiratory variability, suggesting right atrial pressure of 3 mmHg.    Assessment & Plan   1.  HFrEF: Heart rate and blood pressure are not optimal for reduced ejection fraction of 30 to 35%.  Most recent creatinine 1.7 and therefore I will not begin Entresto.  I will increase carvedilol from 3.125 mg twice daily to 6.25 mg twice daily.  He will continue Jardiance 10 mg daily, and losartan 25 mg daily.  I have given him prescription for furosemide 20 mg as needed for edema and weight gain of 3 to 5 pounds in 24 hours.  He will continue  daily weights, salt restriction, volume restriction to  1200 mL daily.  Begin purposeful exercise with walking at least 15 to 20 minutes daily.  I will repeat BMET in 2 weeks to evaluate patient's creatinine level with the hopes of beginning Entresto to work for GDMT.  I will not start him on spironolactone at this time.  Goal of blood pressure 110/70.  Goal of heart rate 65-70.  2.  Status post COVID virus infection: Completely resolved concerning symptoms.  3.  Hypertension: Blood pressure is not optimal as discussed above.  Increasing carvedilol as described.  4.  Type 2 diabetes: Followed by PCP.  Hemoglobin A1c was not within range and will need to be followed closely as an outpatient.         Signed, Bettey Mare. Liborio Nixon, ANP, AACC   08/19/2023 10:02 AM      Office [phone] Fax [phone]  Notice: This dictation was prepared with Dragon dictation along with smaller phrase technology. Any transcriptional errors that result from this process are unintentional and may not be corrected upon review.

## 2023-08-19 ENCOUNTER — Other Ambulatory Visit: Payer: Self-pay | Admitting: Adult Health

## 2023-08-19 ENCOUNTER — Encounter: Payer: Self-pay | Admitting: Adult Health

## 2023-08-19 ENCOUNTER — Ambulatory Visit: Payer: BC Managed Care – PPO | Attending: Adult Health | Admitting: Adult Health

## 2023-08-19 VITALS — BP 128/82 | HR 83 | Ht 65.0 in | Wt 221.2 lb

## 2023-08-19 DIAGNOSIS — E1159 Type 2 diabetes mellitus with other circulatory complications: Secondary | ICD-10-CM

## 2023-08-19 DIAGNOSIS — I152 Hypertension secondary to endocrine disorders: Secondary | ICD-10-CM

## 2023-08-19 DIAGNOSIS — I5032 Chronic diastolic (congestive) heart failure: Secondary | ICD-10-CM | POA: Diagnosis not present

## 2023-08-19 DIAGNOSIS — I10 Essential (primary) hypertension: Secondary | ICD-10-CM | POA: Diagnosis not present

## 2023-08-19 DIAGNOSIS — I4719 Other supraventricular tachycardia: Secondary | ICD-10-CM | POA: Diagnosis not present

## 2023-08-19 DIAGNOSIS — I5022 Chronic systolic (congestive) heart failure: Secondary | ICD-10-CM | POA: Diagnosis not present

## 2023-08-19 MED ORDER — FUROSEMIDE 20 MG PO TABS: 20.0000 mg | ORAL_TABLET | ORAL | 6 refills | Status: DC | PRN

## 2023-08-19 MED ORDER — FUROSEMIDE 20 MG PO TABS: 20.0000 mg | ORAL_TABLET | ORAL | 0 refills | Status: DC | PRN

## 2023-08-19 MED ORDER — CARVEDILOL 6.25 MG PO TABS: 6.2500 mg | ORAL_TABLET | Freq: Two times a day (BID) | ORAL | 3 refills | Status: DC

## 2023-08-19 NOTE — Patient Instructions (Addendum)
Medication Instructions:  Increase Coreg to 6.25 mg ( Take 1 Tablet Twice Daily). *If you need a refill on your cardiac medications before your next appointment, please call your pharmacy*   Lab Work: BMET In 2 Weeks. If you have labs (blood work) drawn today and your tests are completely normal, you will receive your results only by: MyChart Message (if you have MyChart) OR A paper copy in the mail If you have any lab test that is abnormal or we need to change your treatment, we will call you to review the results.   Testing/Procedures: No Testing   Follow-Up: At [facility], you and your health needs are our priority.  As part of our continuing mission to provide you with exceptional heart care, we have created designated Provider Care Teams.  These Care Teams include your primary Cardiologist (physician) and Advanced Practice Providers (APPs -  Physician Assistants and Nurse Practitioners) who all work together to provide you with the care you need, when you need it.  We recommend signing up for the patient portal called "MyChart".  Sign up information is provided on this After Visit Summary.  MyChart is used to connect with patients for Virtual Visits (Telemedicine).  Patients are able to view lab/test results, encounter notes, upcoming appointments, etc.  Non-urgent messages can be sent to your provider as well.   To learn more about what you can do with MyChart, go to [email or web address].    Your next appointment:   1 month(s)  Provider:    Joni Reining, ANP, DNP  Other Instructions Take Blood Pressure and Weight Daily. Bring blood Pressure and Weight Log to Follow Up Appointment.

## 2023-08-20 LAB — TSH: TSH: 1.66 u[IU]/mL (ref 0.450–4.500)

## 2023-08-20 LAB — CBC
Hematocrit: 36.8 % — ABNORMAL LOW (ref 37.5–51.0)
Hemoglobin: 11.8 g/dL — ABNORMAL LOW (ref 13.0–17.7)
MCH: 29.2 pg (ref 26.6–33.0)
MCHC: 32.1 g/dL (ref 31.5–35.7)
MCV: 91 fL (ref 79–97)
Platelets: 295 10*3/uL (ref 150–450)
RBC: 4.04 x10E6/uL — ABNORMAL LOW (ref 4.14–5.80)
RDW: 15.2 % (ref 11.6–15.4)
WBC: 10.3 10*3/uL (ref 3.4–10.8)

## 2023-08-20 LAB — BASIC METABOLIC PANEL
BUN/Creatinine Ratio: 14 (ref 9–20)
BUN: 28 mg/dL — ABNORMAL HIGH (ref 6–24)
CO2: 20 mmol/L (ref 20–29)
Calcium: 8.6 mg/dL — ABNORMAL LOW (ref 8.7–10.2)
Chloride: 108 mmol/L — ABNORMAL HIGH (ref 96–106)
Creatinine, Ser: 1.94 mg/dL — ABNORMAL HIGH (ref 0.76–1.27)
Glucose: 176 mg/dL — ABNORMAL HIGH (ref 70–99)
Potassium: 5.5 mmol/L — ABNORMAL HIGH (ref 3.5–5.2)
Sodium: 142 mmol/L (ref 134–144)
eGFR: 39 mL/min/{1.73_m2} — ABNORMAL LOW (ref >59)

## 2023-08-23 ENCOUNTER — Telehealth: Payer: Self-pay

## 2023-08-23 NOTE — Telephone Encounter (Addendum)
Results viewed by patient via Mychart.----- Message from Joni Reining sent at 08/20/2023  7:33 AM EDT ----- Kidney function has worsened.  I have increased his carvedilol for better blood pressure control, he is to take Lasix as needed only for swelling and edema.  Will repeat BMET in 1 month.  He should see his PCP for elevated blood glucose. Please refer him to [facility].

## 2023-08-25 ENCOUNTER — Ambulatory Visit (HOSPITAL_COMMUNITY)
Admit: 2023-08-25 | Discharge: 2023-08-25 | Disposition: A | Discharge status: Home / Self Care | Payer: BC Managed Care – PPO | Source: Ambulatory Visit | Attending: Physician Assistant | Admitting: Physician Assistant

## 2023-08-25 ENCOUNTER — Encounter (HOSPITAL_COMMUNITY): Payer: Self-pay

## 2023-08-25 ENCOUNTER — Other Ambulatory Visit (HOSPITAL_COMMUNITY): Payer: Self-pay

## 2023-08-25 VITALS — BP 142/72 | HR 84 | Wt 226.4 lb

## 2023-08-25 DIAGNOSIS — R0683 Snoring: Secondary | ICD-10-CM | POA: Insufficient documentation

## 2023-08-25 DIAGNOSIS — I130 Hypertensive heart and chronic kidney disease with heart failure and stage 1 through stage 4 chronic kidney disease, or unspecified chronic kidney disease: Secondary | ICD-10-CM | POA: Insufficient documentation

## 2023-08-25 DIAGNOSIS — I5022 Chronic systolic (congestive) heart failure: Secondary | ICD-10-CM

## 2023-08-25 DIAGNOSIS — R5383 Other fatigue: Secondary | ICD-10-CM | POA: Insufficient documentation

## 2023-08-25 DIAGNOSIS — N1831 Chronic kidney disease, stage 3a: Secondary | ICD-10-CM | POA: Insufficient documentation

## 2023-08-25 DIAGNOSIS — I5020 Unspecified systolic (congestive) heart failure: Secondary | ICD-10-CM | POA: Diagnosis not present

## 2023-08-25 DIAGNOSIS — I428 Other cardiomyopathies: Secondary | ICD-10-CM | POA: Insufficient documentation

## 2023-08-25 DIAGNOSIS — R9431 Abnormal electrocardiogram [ECG] [EKG]: Secondary | ICD-10-CM | POA: Diagnosis not present

## 2023-08-25 DIAGNOSIS — E669 Obesity, unspecified: Secondary | ICD-10-CM | POA: Insufficient documentation

## 2023-08-25 DIAGNOSIS — I10 Essential (primary) hypertension: Secondary | ICD-10-CM

## 2023-08-25 DIAGNOSIS — E1122 Type 2 diabetes mellitus with diabetic chronic kidney disease: Secondary | ICD-10-CM | POA: Diagnosis not present

## 2023-08-25 DIAGNOSIS — Z6837 Body mass index (BMI) 37.0-37.9, adult: Secondary | ICD-10-CM | POA: Insufficient documentation

## 2023-08-25 DIAGNOSIS — G4719 Other hypersomnia: Secondary | ICD-10-CM

## 2023-08-25 LAB — COMPREHENSIVE METABOLIC PANEL
ALT: 65 U/L — ABNORMAL HIGH (ref 0–44)
AST: 38 U/L (ref 15–41)
Albumin: 3 g/dL — ABNORMAL LOW (ref 3.5–5.0)
Alkaline Phosphatase: 205 U/L — ABNORMAL HIGH (ref 38–126)
Anion gap: 10 (ref 5–15)
BUN: 30 mg/dL — ABNORMAL HIGH (ref 6–20)
CO2: 19 mmol/L — ABNORMAL LOW (ref 22–32)
Calcium: 8.1 mg/dL — ABNORMAL LOW (ref 8.9–10.3)
Chloride: 112 mmol/L — ABNORMAL HIGH (ref 98–111)
Creatinine, Ser: 1.93 mg/dL — ABNORMAL HIGH (ref 0.61–1.24)
GFR, Estimated: 39 mL/min — ABNORMAL LOW (ref >60)
Glucose, Bld: 111 mg/dL — ABNORMAL HIGH (ref 70–99)
Potassium: 4.7 mmol/L (ref 3.5–5.1)
Sodium: 141 mmol/L (ref 135–145)
Total Bilirubin: 0.8 mg/dL (ref 0.3–1.2)
Total Protein: 6.1 g/dL — ABNORMAL LOW (ref 6.5–8.1)

## 2023-08-25 LAB — BRAIN NATRIURETIC PEPTIDE: B Natriuretic Peptide: 1276.4 pg/mL — ABNORMAL HIGH (ref 0.0–100.0)

## 2023-08-25 MED ORDER — CARVEDILOL 6.25 MG PO TABS: 3.1250 mg | ORAL_TABLET | Freq: Two times a day (BID) | ORAL | Status: DC

## 2023-08-25 MED ORDER — ISOSORB DINITRATE-HYDRALAZINE 20-37.5 MG PO TABS: 0.5000 | ORAL_TABLET | Freq: Three times a day (TID) | ORAL | 3 refills | Status: DC

## 2023-08-25 MED ORDER — FUROSEMIDE 40 MG PO TABS: 40.0000 mg | ORAL_TABLET | Freq: Every day | ORAL | 6 refills | Status: DC

## 2023-08-25 NOTE — Patient Instructions (Addendum)
Medication Changes:  Decrease Carvedilol to 3.125 mg (1/2 tab) Twice daily   START Bidil 1/2 tab Three times a day   Increase Furosemide to 40 mg Daily  Lab Work:  Labs done today, your results will be available in MyChart, we will contact you for abnormal readings.   Testing/Procedures:  Your provider has recommended that you have a home sleep study (Itamar Test).  We have provided you with the equipment in our office today. Please go ahead and download the app. DO NOT OPEN OR TAMPER WITH THE BOX UNTIL WE ADVISE YOU TO DO SO. Once insurance has approved the test our office will call you with PIN number and approval to proceed with testing. Once you have completed the test you just dispose of the equipment, the information is automatically uploaded to [place] via blue-tooth technology. If your test is positive for sleep apnea and you need a home CPAP machine you will be contacted by Dr Norris Cross office [facility]) to set this up. WE WILL GET THIS APPROVED BY YOUR INSURANCE COMPANY AND IF APPROVED WILL GIVE YOU THE DEVICE AT YOUR FOLLOW UP NEXT WEEK  Referrals:  none  Special Instructions // Education:  Do the following things EVERYDAY: Weigh yourself in the morning before breakfast. Write it down and keep it in a log. Take your medicines as prescribed Eat low salt foods--Limit salt (sodium) to 2000 mg per day.  Stay as active as you can everyday Limit all fluids for the day to less than 2 liters   Follow-Up in: 1 week and again in 3 weeks    At the [facility], you and your health needs are our priority. We have a designated team specialized in the treatment of Heart Failure. This Care Team includes your primary Heart Failure Specialized Cardiologist (physician), Advanced Practice Providers (APPs- Physician Assistants and Nurse Practitioners), and Pharmacist who all work together to provide you with the care you need, when you need it.   You may see any of the  following providers on your designated Care Team at your next follow up:  Dr. Arvilla Meres Dr. Marca Ancona Dr. Marcos Eke, NP Robbie Lis, [place] [facility] [place], [place] Brynda Peon, NP Karle Plumber, PharmD   Please be sure to bring in all your medications bottles to every appointment.   Need to Contact us:  If you have any questions or concerns before your next appointment please send [place] a message through [place] or call our office at [phone].    TO LEAVE A MESSAGE FOR THE NURSE SELECT OPTION 2, PLEASE LEAVE A MESSAGE INCLUDING: YOUR NAME DATE OF BIRTH CALL BACK NUMBER REASON FOR CALL**this is important as we prioritize the call backs  YOU WILL RECEIVE A CALL BACK THE SAME DAY AS LONG AS YOU CALL BEFORE 4:00 PM

## 2023-08-25 NOTE — Progress Notes (Signed)
ReDS Vest / Clip - 08/25/23 0902       ReDS Vest / Clip   Station Marker B    Ruler Value 35    ReDS Value Range High volume overload    ReDS Actual Value 47

## 2023-08-25 NOTE — Progress Notes (Addendum)
HEART & VASCULAR TRANSITION OF CARE CONSULT NOTE     Referring Physician: Dr. Hanley Ben Primary Care: Dr. Concepcion Elk Primary Cardiologist: Dr. [place]  HPI: Referred to clinic by Dr. Hanley Ben for heart failure consultation. 57 y.o. male with history of DM II, CKD, HTN and pericardial effusion.   He was seen by Cardiology in November 2022 w/ persistent shortness of breath and cough after RSV infection. CT w/ no PE but did show small to moderate pericardial effusion as well as mediastinal and hilar adenopathy. Echo with EF 40-45%, global HK, mild LVH, grade I DD, RV okay, small pericardial effusion. Echo reviewed by Dr. Flora Lipps and EF felt to be closer to 55-60%.   He had been on phentermine for weight loss for a couple of years, discontinued around August 2024 d/t tachycardia.  Presented to Cardiology clinic 08/03/23 with worsening dyspnea with exertion after a trip to [place].  Sinus tachycardia noted and he was started on metoprolol.   He was eventually seen in ED on 08/04/23 with worsening dyspnea. He was admitted for acute respiratory failure requiring BiPAP 2/2 acute CHF+/-+ COVID-19 pneumonitis. He was diuresed with IV lasix and started on steroids. Cardiology consulted. Echo with EF 30-35%. R/LHC: No CAD, normal filling pressures and preserved CO.  GDMT titrated.  Saw Cardiology for follow-up on 09/19. Coreg increased to 6.25 mg BID.  He is here today for hospital follow-up.  Reports ongoing fatigue and dyspnea with minimal exertion. Can walk about 1/2 block before he feels exhausted. Fatigue worse after increasing dose of Coreg. No orthopnea or PND. Notes a little swelling around his sock line. + abdominal bloating and ongoing cough since hospitalization. Discharge weight 212 lb, 226 lb in clinic today. However, home weight stable between 212-213 lb.   He tries to limit fluid intake but admits he gets very thirsty at night and frequently sips on water. Does not use salt shaker. Takes his lunch to  work and tries to select low-sodium options (does eat deli meat).  BP tends to average 120s-130s/80s at home  Has not been diagnosed with sleep apnea. Wife says he snores.  No ETOH, tobacco or drug use. Denies family history of CHF.  Works full-time as a Production designer, theatre/television/film at [employer].    Past Medical History:  Diagnosis Date   Essential hypertension 12/04/2013   Hyperlipidemia    Type 2 diabetes mellitus (HCC) 12/04/2013    Current Outpatient Medications  Medication Sig Dispense Refill   albuterol (VENTOLIN HFA) 108 (90 Base) MCG/ACT inhaler Inhale 2 puffs into the lungs every 6 (six) hours as needed for wheezing. 2 each 11   carvedilol (COREG) 6.25 MG tablet Take 1 tablet (6.25 mg total) by mouth 2 (two) times daily. 180 tablet 3   empagliflozin (JARDIANCE) 10 MG TABS tablet Take 10 mg by mouth daily.     ferrous fumarate (HEMOCYTE - 106 MG FE) 325 (106 Fe) MG TABS tablet Take 1 tablet by mouth daily.     furosemide (LASIX) 20 MG tablet Take 1 tablet (20 mg total) by mouth as needed for fluid or edema (weight gain of  3 pounds over night or 5 pounds in a week/). 30 tablet 6   guaiFENesin-dextromethorphan (ROBITUSSIN DM) 100-10 MG/5ML syrup Take 10 mLs by mouth every 4 (four) hours as needed for cough. 236 mL 1   losartan (COZAAR) 25 MG tablet Take 1 tablet (25 mg total) by mouth daily. 30 tablet 0   sodium bicarbonate 650 MG tablet Take  1 tablet (650 mg total) by mouth 2 (two) times daily. 30 tablet 0   tamsulosin (FLOMAX) 0.4 MG CAPS capsule TAKE ONE CAPSULE BY MOUTH DAILY AFTER BREAKFAST 30 capsule 2   TRELEGY ELLIPTA 100-62.5-25 MCG/ACT AEPB Inhale 1 puff into the lungs daily.     TRESIBA FLEXTOUCH 100 UNIT/ML FlexTouch Pen Inject 15 Units into the skin daily.     VITAMIN D PO Take by mouth daily.     VITAMIN E PO Take by mouth daily.     No current facility-administered medications for this encounter.    Allergies  Allergen Reactions   Amoxicillin Swelling   Atorvastatin  Other (See Comments)    Severe muscle pain/Weakness   Penicillins Swelling      Social History   Socioeconomic History   Marital status: Divorced    Spouse name: [name]   Number of children: 5   Years of education: College   Highest education level: Not on file  Occupational History   Occupation: Clinical cytogeneticist: Artist  Tobacco Use   Smoking status: Former   Smokeless tobacco: Never  Advertising account planner   Vaping status: Never Used  Substance and Sexual Activity   Alcohol use: No    Alcohol/week: 0.0 standard drinks of alcohol   Drug use: No    Comment: use CBD   Sexual activity: Yes    Partners: Female  Other Topics Concern   Not on file  Social History Narrative   Lives at home w/ his wife   Right-handed   Drinks about 10 cans of soda per day      Engineer, water.    Social Determinants of Health   Financial Resource Strain: Not on file  Food Insecurity: Patient Declined (08/05/2023)   Hunger Vital Sign    Worried About Running Out of Food in the Last Year: Patient declined    Ran Out of Food in the Last Year: Patient declined  Transportation Needs: Patient Declined (08/05/2023)   PRAPARE - Administrator, Civil Service (Medical): Patient declined    Lack of Transportation (Non-Medical): Patient declined  Physical Activity: Not on file  Stress: Not on file  Social Connections: Unknown (07/29/2023)   Received from [facility]   Social Network    Social Network: Not on file  Intimate Partner Violence: Patient Declined (08/05/2023)   Humiliation, Afraid, Rape, and Kick questionnaire    Fear of Current or Ex-Partner: Patient declined    Emotionally Abused: Patient declined    Physically Abused: Patient declined    Sexually Abused: Patient declined      Family History  Problem Relation Age of Onset   Alcohol abuse Mother    Cancer Mother    Alcohol abuse Father    Diabetes Father    Hypertension Father    Cancer  Sister    Cancer Other     Vitals:   08/25/23 0902  BP: (!) 142/72  Pulse: 84  SpO2: 93%  Weight: 102.7 kg (226 lb 6.4 oz)    PHYSICAL EXAM: General:  Well appearing.  HEENT: normal Neck: supple. Thick neck. JVP appears elevated Cor: PMI nondisplaced. Regular rate & rhythm. No rubs, gallops or murmurs. Lungs: scant crackles Abdomen: obese, soft, nontender,+ distended.  Extremities: no cyanosis, clubbing, rash, 1+ edema Neuro: alert & oriented x 3. Affect pleasant.  ECG: SR 80 bpm   ASSESSMENT & PLAN: HFrEF/NICM -EF 55-60% on prior echo in 12/22 -Admit  with new CHF 09/24.  -Echo 09/24: EF 30-35%, RV okay -R/LHC 09/24: No CAD, normal filling pressures w/ preserved CO -Etiology not certain. Has hypertension but BP has not been significantly elevated. ? Viral but notes worsening fatigue and dyspnea even prior to recent COVID infection. Had been on phentermine for a couple of years to assist with weight loss, he should remain off this in the future. -Check sleep study -NYHA III. Volume overloaded on exam and ReDS is 47%. Has lasix to use PRN, has not taken. Start lasix 40 mg daily. Will need to increase dose or add metolazone if not improving in the next few days (he was instructed to call).  -Cut back Coreg to 3.125 mg BID -Start bidil 1/2 tab TID -Continue Jardiance -Check labs today. May need to stop losartan if potassium remains elevated. K was 5.5 on 09/19. Hx of hyperkalemia. -Repeat echo once on maximally tolerated GDMT. May need cMRI.  HTN -BP elevated today -Med changes as above  CKD IIIa -Baseline Scr seems to be 1.7-2 -? Etiology DM +/-HTN -Labs today  DM II -A1c 7.6% -Continue Jardiance -Management per PCP  Obesity -Could consider GLP-1 RA at f/u -Avoid phentermine  Snoring/fatigue -Check sleep study   Referred to HFSW (PCP, Medications, Transportation, ETOH Abuse, Drug Abuse, Insurance, Surveyor, quantity ): No Refer to Pharmacy: No Refer to Home Health:  No Refer to Advanced [facility] Failure Clinic: Yes  Refer to General Cardiology: No, already established  Follow up  1 week to assess volume, labs and titrate GDMT, 3 weeks to establish with Dr. Elwyn Lade. Will continue to follow along with Cardiology.

## 2023-08-31 ENCOUNTER — Other Ambulatory Visit: Payer: Self-pay

## 2023-08-31 ENCOUNTER — Telehealth (HOSPITAL_COMMUNITY): Payer: Self-pay

## 2023-08-31 NOTE — Telephone Encounter (Signed)
Called to confirm Heart & Vascular Transitions of Care appointment. No answer, Left message to return call.

## 2023-08-31 NOTE — Telephone Encounter (Signed)
This is a CHF pt 

## 2023-09-01 ENCOUNTER — Other Ambulatory Visit (HOSPITAL_COMMUNITY): Payer: Self-pay | Admitting: Cardiology

## 2023-09-01 ENCOUNTER — Telehealth (HOSPITAL_COMMUNITY): Payer: Self-pay

## 2023-09-01 ENCOUNTER — Ambulatory Visit (HOSPITAL_COMMUNITY)
Admission: RE | Admit: 2023-09-01 | Discharge: 2023-09-01 | Disposition: A | Discharge status: Home / Self Care | Payer: BC Managed Care – PPO | Source: Ambulatory Visit | Attending: Cardiology | Admitting: Cardiology

## 2023-09-01 ENCOUNTER — Encounter (HOSPITAL_COMMUNITY): Payer: Self-pay

## 2023-09-01 VITALS — BP 102/60 | HR 91 | Wt 214.6 lb

## 2023-09-01 DIAGNOSIS — Z79899 Other long term (current) drug therapy: Secondary | ICD-10-CM | POA: Insufficient documentation

## 2023-09-01 DIAGNOSIS — Z7984 Long term (current) use of oral hypoglycemic drugs: Secondary | ICD-10-CM | POA: Diagnosis not present

## 2023-09-01 DIAGNOSIS — E669 Obesity, unspecified: Secondary | ICD-10-CM | POA: Insufficient documentation

## 2023-09-01 DIAGNOSIS — R0683 Snoring: Secondary | ICD-10-CM | POA: Diagnosis not present

## 2023-09-01 DIAGNOSIS — Z833 Family history of diabetes mellitus: Secondary | ICD-10-CM | POA: Diagnosis not present

## 2023-09-01 DIAGNOSIS — N1831 Chronic kidney disease, stage 3a: Secondary | ICD-10-CM | POA: Insufficient documentation

## 2023-09-01 DIAGNOSIS — E1122 Type 2 diabetes mellitus with diabetic chronic kidney disease: Secondary | ICD-10-CM | POA: Insufficient documentation

## 2023-09-01 DIAGNOSIS — I5022 Chronic systolic (congestive) heart failure: Secondary | ICD-10-CM

## 2023-09-01 DIAGNOSIS — Z8616 Personal history of COVID-19: Secondary | ICD-10-CM | POA: Insufficient documentation

## 2023-09-01 DIAGNOSIS — Z8249 Family history of ischemic heart disease and other diseases of the circulatory system: Secondary | ICD-10-CM | POA: Diagnosis not present

## 2023-09-01 DIAGNOSIS — I428 Other cardiomyopathies: Secondary | ICD-10-CM | POA: Diagnosis not present

## 2023-09-01 DIAGNOSIS — I130 Hypertensive heart and chronic kidney disease with heart failure and stage 1 through stage 4 chronic kidney disease, or unspecified chronic kidney disease: Secondary | ICD-10-CM | POA: Diagnosis not present

## 2023-09-01 DIAGNOSIS — Z794 Long term (current) use of insulin: Secondary | ICD-10-CM | POA: Insufficient documentation

## 2023-09-01 LAB — BRAIN NATRIURETIC PEPTIDE: B Natriuretic Peptide: 523.9 pg/mL — ABNORMAL HIGH (ref 0.0–100.0)

## 2023-09-01 LAB — COMPREHENSIVE METABOLIC PANEL
ALT: 52 U/L — ABNORMAL HIGH (ref 0–44)
AST: 38 U/L (ref 15–41)
Albumin: 3.3 g/dL — ABNORMAL LOW (ref 3.5–5.0)
Alkaline Phosphatase: 166 U/L — ABNORMAL HIGH (ref 38–126)
Anion gap: 11 (ref 5–15)
BUN: 52 mg/dL — ABNORMAL HIGH (ref 6–20)
CO2: 21 mmol/L — ABNORMAL LOW (ref 22–32)
Calcium: 8.7 mg/dL — ABNORMAL LOW (ref 8.9–10.3)
Chloride: 106 mmol/L (ref 98–111)
Creatinine, Ser: 2.22 mg/dL — ABNORMAL HIGH (ref 0.61–1.24)
GFR, Estimated: 33 mL/min — ABNORMAL LOW (ref >60)
Glucose, Bld: 182 mg/dL — ABNORMAL HIGH (ref 70–99)
Potassium: 4.1 mmol/L (ref 3.5–5.1)
Sodium: 138 mmol/L (ref 135–145)
Total Bilirubin: 0.6 mg/dL (ref 0.3–1.2)
Total Protein: 6.5 g/dL (ref 6.5–8.1)

## 2023-09-01 MED ORDER — LOSARTAN POTASSIUM 25 MG PO TABS: 25.0000 mg | ORAL_TABLET | Freq: Every day | ORAL | 3 refills | Status: DC

## 2023-09-01 MED ORDER — FUROSEMIDE 40 MG PO TABS: 40.0000 mg | ORAL_TABLET | Freq: Every day | ORAL | 3 refills | Status: DC

## 2023-09-01 MED ORDER — CARVEDILOL 3.125 MG PO TABS: 3.1250 mg | ORAL_TABLET | Freq: Two times a day (BID) | ORAL | 3 refills | Status: DC

## 2023-09-01 MED ORDER — ISOSORB DINITRATE-HYDRALAZINE 20-37.5 MG PO TABS: 0.5000 | ORAL_TABLET | Freq: Three times a day (TID) | ORAL | 3 refills | Status: DC

## 2023-09-01 MED ORDER — EMPAGLIFLOZIN 10 MG PO TABS: 10.0000 mg | ORAL_TABLET | Freq: Every day | ORAL | 3 refills | Status: DC

## 2023-09-01 MED ORDER — FUROSEMIDE 40 MG PO TABS: ORAL_TABLET | ORAL | 3 refills | Status: DC

## 2023-09-01 MED ORDER — ISOSORB DINITRATE-HYDRALAZINE 20-37.5 MG PO TABS
0.5000 | ORAL_TABLET | Freq: Three times a day (TID) | ORAL | 3 refills | Status: DC
Start: 2023-09-01 — End: 2023-11-30

## 2023-09-01 MED ORDER — SODIUM BICARBONATE 650 MG PO TABS: 650.0000 mg | ORAL_TABLET | Freq: Two times a day (BID) | ORAL | 3 refills | Status: DC

## 2023-09-01 NOTE — Patient Instructions (Signed)
There has been no changes to your medications.  Labs done today, your results will be available in MyChart, we will contact you for abnormal readings.  Keep follow up with Dr. Gasper Lloyd as scheduled.  If you have any questions or concerns before your next appointment please send [place] a message through [place] or call our office at [phone].    TO LEAVE A MESSAGE FOR THE NURSE SELECT OPTION 2, PLEASE LEAVE A MESSAGE INCLUDING: YOUR NAME DATE OF BIRTH CALL BACK NUMBER REASON FOR CALL**this is important as we prioritize the call backs  YOU WILL RECEIVE A CALL BACK THE SAME DAY AS LONG AS YOU CALL BEFORE 4:00 PM  At the [facility], you and your health needs are our priority. As part of our continuing mission to provide you with exceptional heart care, we have created designated Provider Care Teams. These Care Teams include your primary Cardiologist (physician) and Advanced Practice Providers (APPs- Physician Assistants and Nurse Practitioners) who all work together to provide you with the care you need, when you need it.   You may see any of the following providers on your designated Care Team at your next follow up: Dr Arvilla Meres Dr Marca Ancona Dr. Dorthula Nettles Dr. Clearnce Hasten Amy Filbert Schilder, NP Robbie Lis, [place] [facility] [place], [place] Brynda Peon, NP [place] Lee, NP Karle Plumber, PharmD   Please be sure to bring in all your medications bottles to every appointment.    Thank you for choosing [facility]

## 2023-09-01 NOTE — Progress Notes (Signed)
ReDS Vest / Clip - 09/01/23 0840       ReDS Vest / Clip   Station Marker D    Ruler Value 35    ReDS Value Range Low volume    ReDS Actual Value 35

## 2023-09-01 NOTE — Telephone Encounter (Signed)
-----   Message from [place] sent at 09/01/2023  1:18 PM EDT ----- Slight bump in SCr. Suspect over diuresis. Reduce lasix down form daily to every other day. Monitor weight w/ med reduction.

## 2023-09-01 NOTE — Progress Notes (Signed)
HEART & VASCULAR TRANSITION OF CARE PROGRESS NOTE     Referring Physician: Dr. Hanley Ben Primary Care: Dr. Concepcion Elk Primary Cardiologist: Dr. [place]  HPI: Referred to clinic by Dr. Hanley Ben for heart failure consultation. 57 y.o. male with history of DM II, CKD, HTN and pericardial effusion.   He was seen by Cardiology in November 2022 w/ persistent shortness of breath and cough after RSV infection. CT w/ no PE but did show small to moderate pericardial effusion as well as mediastinal and hilar adenopathy. Echo with EF 40-45%, global HK, mild LVH, grade I DD, RV okay, small pericardial effusion. Echo reviewed by Dr. Flora Lipps and EF felt to be closer to 55-60%.   He had been on phentermine for weight loss for a couple of years, discontinued around August 2024 d/t tachycardia.  Presented to Cardiology clinic 08/03/23 with worsening dyspnea with exertion after a trip to [place].  Sinus tachycardia noted and he was started on metoprolol.   He was eventually seen in ED on 08/04/23 with worsening dyspnea. He was admitted for acute respiratory failure requiring BiPAP 2/2 acute CHF+/-+ COVID-19 pneumonitis. He was diuresed with IV lasix and started on steroids. Cardiology consulted. Echo with EF 30-35%. R/LHC: No CAD, normal filling pressures and preserved CO.  GDMT titrated. D  Saw Cardiology for follow-up on 09/19. Coreg increased to 6.25 mg BID.  Had intiail TOC appt on 9/25. He was volume overloaded on exam and ReDS was 47%. Endorsed NYHA Class III symptoms. Also reported increased fatigue. Wt was also up from d/c wt of 212 lb to a clinic wt of 226 lb. He was instructed to switch lasix from PRN to daily. Jardiance was also added to regimen and Coreg was reduced to 3.125 mg bid and he was started on Bidil 1/2 tablet tid. He was ordered to get sleep study done and referral placed to the [facility].   He presents back to [facility] clinic today for volume assessment. He reports he feels much better. Wt down 12 lb from  226>>214 lb. ReDs 35%. Fully compliant w/ meds. BP 102/60. No orthostatic symptoms. Has been working on fluid and sodium restriction. Sleep study not done yet.   No ETOH, tobacco or drug use. Denies family history of CHF.  Works full-time as a Production designer, theatre/television/film at [employer].    Past Medical History:  Diagnosis Date   Essential hypertension 12/04/2013   Hyperlipidemia    Type 2 diabetes mellitus (HCC) 12/04/2013    Current Outpatient Medications  Medication Sig Dispense Refill   albuterol (VENTOLIN HFA) 108 (90 Base) MCG/ACT inhaler Inhale 2 puffs into the lungs every 6 (six) hours as needed for wheezing. 2 each 11   carvedilol (COREG) 6.25 MG tablet Take 0.5 tablets (3.125 mg total) by mouth 2 (two) times daily.     empagliflozin (JARDIANCE) 10 MG TABS tablet Take 10 mg by mouth daily.     ferrous fumarate (HEMOCYTE - 106 MG FE) 325 (106 Fe) MG TABS tablet Take 1 tablet by mouth daily.     Fluticasone-Umeclidin-Vilant (TRELEGY ELLIPTA) 100-62.5-25 MCG/ACT AEPB Inhale 1 puff into the lungs as needed.     furosemide (LASIX) 40 MG tablet Take 1 tablet (40 mg total) by mouth daily. 30 tablet 6   guaiFENesin-dextromethorphan (ROBITUSSIN DM) 100-10 MG/5ML syrup Take 10 mLs by mouth every 4 (four) hours as needed for cough. 236 mL 1   isosorbide-hydrALAZINE (BIDIL) 20-37.5 MG tablet Take 0.5 tablets by mouth 3 (three) times daily. 45 tablet  3   losartan (COZAAR) 25 MG tablet Take 1 tablet (25 mg total) by mouth daily. 30 tablet 0   sodium bicarbonate 650 MG tablet Take 1 tablet (650 mg total) by mouth 2 (two) times daily. 30 tablet 0   tamsulosin (FLOMAX) 0.4 MG CAPS capsule TAKE ONE CAPSULE BY MOUTH DAILY AFTER BREAKFAST 30 capsule 2   TRESIBA FLEXTOUCH 100 UNIT/ML FlexTouch Pen Inject 15 Units into the skin daily.     VITAMIN D PO Take by mouth daily.     VITAMIN E PO Take by mouth daily.     No current facility-administered medications for this encounter.    Allergies  Allergen Reactions    Amoxicillin Swelling   Atorvastatin Other (See Comments)    Severe muscle pain/Weakness   Penicillins Swelling      Social History   Socioeconomic History   Marital status: Divorced    Spouse name: [name]   Number of children: 5   Years of education: College   Highest education level: Not on file  Occupational History   Occupation: Clinical cytogeneticist: Artist  Tobacco Use   Smoking status: Former   Smokeless tobacco: Never  Advertising account planner   Vaping status: Never Used  Substance and Sexual Activity   Alcohol use: No    Alcohol/week: 0.0 standard drinks of alcohol   Drug use: No    Comment: use CBD   Sexual activity: Yes    Partners: Female  Other Topics Concern   Not on file  Social History Narrative   Lives at home w/ his wife   Right-handed   Drinks about 10 cans of soda per day      Engineer, water.    Social Determinants of Health   Financial Resource Strain: Not on file  Food Insecurity: Patient Declined (08/05/2023)   Hunger Vital Sign    Worried About Running Out of Food in the Last Year: Patient declined    Ran Out of Food in the Last Year: Patient declined  Transportation Needs: Patient Declined (08/05/2023)   PRAPARE - Administrator, Civil Service (Medical): Patient declined    Lack of Transportation (Non-Medical): Patient declined  Physical Activity: Not on file  Stress: Not on file  Social Connections: Unknown (07/29/2023)   Received from [facility]   Social Network    Social Network: Not on file  Intimate Partner Violence: Patient Declined (08/05/2023)   Humiliation, Afraid, Rape, and Kick questionnaire    Fear of Current or Ex-Partner: Patient declined    Emotionally Abused: Patient declined    Physically Abused: Patient declined    Sexually Abused: Patient declined      Family History  Problem Relation Age of Onset   Alcohol abuse Mother    Cancer Mother    Alcohol abuse Father    Diabetes Father     Hypertension Father    Cancer Sister    Cancer Other     Vitals:   09/01/23 0840  BP: 102/60  Pulse: 91  SpO2: 94%  Weight: 97.3 kg (214 lb 9.6 oz)     PHYSICAL EXAM: ReDs 35% General:  Well appearing. No respiratory difficulty HEENT: normal Neck: supple. Thick neck, JVD not well visualized. Carotids 2+ bilat; no bruits. No lymphadenopathy or thyromegaly appreciated. Cor: PMI nondisplaced. Regular rate & rhythm. No rubs, gallops or murmurs. Lungs: clear Abdomen: soft, nontender, nondistended. No hepatosplenomegaly. No bruits or masses. Good bowel sounds. Extremities:  no cyanosis, clubbing, rash, edema Neuro: alert & oriented x 3, cranial nerves grossly intact. moves all 4 extremities w/o difficulty. Affect pleasant.   ECG: not performed    ASSESSMENT & PLAN: HFrEF/NICM -EF 55-60% on prior echo in 12/22 -Admit with new CHF 09/24.  -Echo 09/24: EF 30-35%, RV okay -R/LHC 09/24: No CAD, normal filling pressures w/ preserved CO -Etiology not certain. Has hypertension but BP has not been significantly elevated. ? Viral but notes worsening fatigue and dyspnea even prior to recent COVID infection. Had been on phentermine for a couple of years to assist with weight loss, he should remain off this in the future. -Check sleep study -NYHA II (improved). Euvolemic on exam. ReDs 35%  -Continue Lasix 40 mg daily  -Continue Coreg 3.125 mg BID -Continue bidil 1/2 tab TID -Continue Jardiance 10 mg daily  -Continue Losartan 25 mg daily. BP too soft for Entresto. Also h/o hyperkalemia -Check BNP and CMP today  -Repeat echo once on maximally tolerated GDMT. May need cMRI if EF not improving.  HTN - controlled on current regimen - GDMT per above - check labs today   CKD IIIa -Baseline Scr seems to be 1.7-2 -? Etiology DM +/-HTN  DM II -A1c 7.6% -Continue Jardiance -Management per PCP  Obesity -Could consider GLP-1 RA at f/u -Avoid phentermine  Snoring/fatigue - sleep  study ordered    Referred to HFSW (PCP, Medications, Transportation, ETOH Abuse, Drug Abuse, Insurance, Financial ): No Refer to Pharmacy: No Refer to Home Health: No Refer to Advanced Heart Failure Clinic: Yes  Refer to General Cardiology: No, already established  Follow up  already referred to the [facility]. Has new pt appt scheduled w/ Dr. Gasper Lloyd on 10/16.   Robbie Lis, PA-C  09/01/2023

## 2023-09-01 NOTE — Telephone Encounter (Signed)
Patient's lasix medication has been changed and up dated on his med list. Per patient's request to send all of his heart meds [facility]. Pt aware, agreeable, and verbalized understanding.

## 2023-09-02 MED ORDER — CARVEDILOL 3.125 MG PO TABS: 3.1250 mg | ORAL_TABLET | Freq: Two times a day (BID) | ORAL | 3 refills | Status: DC

## 2023-09-02 NOTE — Addendum Note (Signed)
Addended by: Theresia Bough on: 09/02/2023 03:37 PM   Modules accepted: Orders

## 2023-09-14 NOTE — Progress Notes (Signed)
ADVANCED HEART FAILURE CLINIC NOTE  Referring Physician: Fleet Contras, MD  Primary Care: Fleet Contras, MD Primary Cardiologist:  HPI: Jordan Taylor is a 57 y.o. male with heart failure with reduced ejection fraction, type 2 diabetes, CKD, hypertension presenting today to establish care.  His cardiac history dates back to November 2022 when echocardiogram demonstrated EF of 40 to 45% with global hypokinesis.  In September 2024 he was seen in cardiology clinic where symptoms of shortness of breath were progressing.  He was sent to the emergency department where he was admitted for acute respiratory failure requiring BiPAP secondary to acute systolic heart failure and COVID-19 pneumonia.  Repeat echo at that time with a EF of 30 to 35%.  Right and left heart catheterization with preserved cardiac output and no obstructive CAD.  Since that time he has been seen in [facility] clinic where GDMT was further uptitrated and he was also found to be volume overloaded with class III symptoms.  No recent tobacco, alcohol or drug use.  No reported family history of CHF.  Currently works as a Production designer, theatre/television/film at [employer].   Activity level/exercise tolerance:  *** Orthopnea:  Sleeps on *** pillows Paroxysmal noctural dyspnea:  *** Chest pain/pressure:  *** Orthostatic lightheadedness:  *** Palpitations:  *** Lower extremity edema:  *** Presyncope/syncope:  *** Cough:  ***  Past Medical History:  Diagnosis Date   Essential hypertension 12/04/2013   Hyperlipidemia    Type 2 diabetes mellitus (HCC) 12/04/2013    Current Outpatient Medications  Medication Sig Dispense Refill   albuterol (VENTOLIN HFA) 108 (90 Base) MCG/ACT inhaler Inhale 2 puffs into the lungs every 6 (six) hours as needed for wheezing. 2 each 11   carvedilol (COREG) 3.125 MG tablet Take 1 tablet (3.125 mg total) by mouth 2 (two) times daily. 180 tablet 3   empagliflozin (JARDIANCE) 10 MG TABS tablet Take 1 tablet (10 mg total) by  mouth daily. 90 tablet 3   ferrous fumarate (HEMOCYTE - 106 MG FE) 325 (106 Fe) MG TABS tablet Take 1 tablet by mouth daily.     Fluticasone-Umeclidin-Vilant (TRELEGY ELLIPTA) 100-62.5-25 MCG/ACT AEPB Inhale 1 puff into the lungs as needed.     furosemide (LASIX) 40 MG tablet Take 1 tablet by mouth every other day 45 tablet 3   guaiFENesin-dextromethorphan (ROBITUSSIN DM) 100-10 MG/5ML syrup Take 10 mLs by mouth every 4 (four) hours as needed for cough. 236 mL 1   isosorbide-hydrALAZINE (BIDIL) 20-37.5 MG tablet Take 0.5 tablets by mouth 3 (three) times daily. 180 tablet 3   losartan (COZAAR) 25 MG tablet Take 1 tablet (25 mg total) by mouth daily. 90 tablet 3   sodium bicarbonate 650 MG tablet Take 1 tablet (650 mg total) by mouth 2 (two) times daily. 180 tablet 3   tamsulosin (FLOMAX) 0.4 MG CAPS capsule TAKE ONE CAPSULE BY MOUTH DAILY AFTER BREAKFAST 30 capsule 2   TRESIBA FLEXTOUCH 100 UNIT/ML FlexTouch Pen Inject 15 Units into the skin daily.     VITAMIN D PO Take by mouth daily.     VITAMIN E PO Take by mouth daily.     No current facility-administered medications for this visit.    Allergies  Allergen Reactions   Amoxicillin Swelling   Atorvastatin Other (See Comments)    Severe muscle pain/Weakness   Penicillins Swelling      Social History   Socioeconomic History   Marital status: Divorced    Spouse name: [name]   Number of  children: 5   Years of education: College   Highest education level: Not on file  Occupational History   Occupation: Clinical cytogeneticist: Artist  Tobacco Use   Smoking status: Former   Smokeless tobacco: Never  Advertising account planner   Vaping status: Never Used  Substance and Sexual Activity   Alcohol use: No    Alcohol/week: 0.0 standard drinks of alcohol   Drug use: No    Comment: use CBD   Sexual activity: Yes    Partners: Female  Other Topics Concern   Not on file  Social History Narrative   Lives at home w/ his wife    Right-handed   Drinks about 10 cans of soda per day      Engineer, water.    Social Determinants of Health   Financial Resource Strain: Not on file  Food Insecurity: Patient Declined (08/05/2023)   Hunger Vital Sign    Worried About Running Out of Food in the Last Year: Patient declined    Ran Out of Food in the Last Year: Patient declined  Transportation Needs: Patient Declined (08/05/2023)   PRAPARE - Administrator, Civil Service (Medical): Patient declined    Lack of Transportation (Non-Medical): Patient declined  Physical Activity: Not on file  Stress: Not on file  Social Connections: Unknown (07/29/2023)   Received [facility]   Social Network    Social Network: Not on file  Intimate Partner Violence: Patient Declined (08/05/2023)   Humiliation, Afraid, Rape, and Kick questionnaire    Fear of Current or Ex-Partner: Patient declined    Emotionally Abused: Patient declined    Physically Abused: Patient declined    Sexually Abused: Patient declined      Family History  Problem Relation Age of Onset   Alcohol abuse Mother    Cancer Mother    Alcohol abuse Father    Diabetes Father    Hypertension Father    Cancer Sister    Cancer Other     PHYSICAL EXAM: There were no vitals filed for this visit. GENERAL: Well nourished, well developed, and in no apparent distress at rest.  HEENT: Negative for arcus senilis or xanthelasma. There is no scleral icterus.  The mucous membranes are pink and moist.   NECK: Supple, No masses. Normal carotid upstrokes without bruits. No masses or thyromegaly.    CHEST: There are no chest wall deformities. There is no chest wall tenderness. Respirations are unlabored.  Lungs- *** CARDIAC:  JVP: *** cm H2O         {HEART SOUNDS:22645}  Normal rate with regular rhythm. No murmurs, rubs or gallops.  Pulses are 2+ and symmetrical in upper and lower extremities. *** edema.  ABDOMEN: Soft, non-tender, non-distended. There are  no masses or hepatomegaly. There are normal bowel sounds.  EXTREMITIES: Warm and well perfused with no cyanosis, clubbing.  LYMPHATIC: No axillary or supraclavicular lymphadenopathy.  NEUROLOGIC: Patient is oriented x3 with no focal or lateralizing neurologic deficits.  PSYCH: Patients affect is appropriate, there is no evidence of anxiety or depression.  SKIN: Warm and dry; no lesions or wounds.   DATA REVIEW  ECG: 925/25: NSR  as per my personal interpretation  ECHO: 10/2021: LVEF 55 to 60%  9/24: LVEF 30 to 35%, preserved RV function  CATH: 08/06/23:  No angiographic evidence of CAD RA 4 RV 39/5/9 PA 36/20 mean 27 PCWP 12 LV 94/26/12 AO 105/70 CO 7.13 L/min CI 3.4  ASSESSMENT & PLAN:  Heart failure with reduced EF Etiology of HF: Possibly secondary to viral myocarditis. NYHA class / AHA Stage:*** Volume status & Diuretics: Lasix 40 mg daily Vasodilators: Losartan 25 mg daily and BiDil half tab 3 times daily. Beta-Blocker: Coreg 3.125 mg twice daily MRA: Currently holding due to history of hyperkalemia Cardiometabolic: Jardiance 10 mg daily Devices therapies & Valvulopathies:*** Advanced therapies:***  2.  Hypertension -Well-controlled on current regimen - Repeat labs today including BMP/BNP  3.  CKD 3A -Baseline creatinine around 1.7-2.  Repeat lab work today. -Likely hypertensive/diabetic.  4.  Type 2 diabetes -Most recent A1c 7.6%.  Following with PCP. -Jardiance 10 mg daily  5.  Obesity -There is no height or weight on file to calculate BMI.  6. Fatigue/obesity - Snoring at night with CHF - Sleep study ***   Norvin Ohlin Advanced Heart Failure Mechanical Circulatory Support

## 2023-09-15 ENCOUNTER — Ambulatory Visit (HOSPITAL_COMMUNITY)
Admission: RE | Admit: 2023-09-15 | Discharge: 2023-09-15 | Disposition: A | Discharge status: Home / Self Care | Payer: BC Managed Care – PPO | Source: Ambulatory Visit | Attending: Cardiology | Admitting: Cardiology

## 2023-09-15 ENCOUNTER — Encounter (HOSPITAL_COMMUNITY): Payer: Self-pay | Admitting: Cardiology

## 2023-09-15 VITALS — BP 126/82 | HR 97 | Wt 217.2 lb

## 2023-09-15 DIAGNOSIS — I10 Essential (primary) hypertension: Secondary | ICD-10-CM

## 2023-09-15 DIAGNOSIS — Z6836 Body mass index (BMI) 36.0-36.9, adult: Secondary | ICD-10-CM | POA: Insufficient documentation

## 2023-09-15 DIAGNOSIS — I5020 Unspecified systolic (congestive) heart failure: Secondary | ICD-10-CM

## 2023-09-15 DIAGNOSIS — E669 Obesity, unspecified: Secondary | ICD-10-CM | POA: Diagnosis not present

## 2023-09-15 DIAGNOSIS — I130 Hypertensive heart and chronic kidney disease with heart failure and stage 1 through stage 4 chronic kidney disease, or unspecified chronic kidney disease: Secondary | ICD-10-CM | POA: Insufficient documentation

## 2023-09-15 DIAGNOSIS — I5022 Chronic systolic (congestive) heart failure: Secondary | ICD-10-CM | POA: Diagnosis not present

## 2023-09-15 DIAGNOSIS — R5383 Other fatigue: Secondary | ICD-10-CM | POA: Diagnosis not present

## 2023-09-15 DIAGNOSIS — N1831 Chronic kidney disease, stage 3a: Secondary | ICD-10-CM | POA: Insufficient documentation

## 2023-09-15 DIAGNOSIS — E118 Type 2 diabetes mellitus with unspecified complications: Secondary | ICD-10-CM

## 2023-09-15 DIAGNOSIS — E1122 Type 2 diabetes mellitus with diabetic chronic kidney disease: Secondary | ICD-10-CM | POA: Diagnosis not present

## 2023-09-15 LAB — BASIC METABOLIC PANEL
Anion gap: 13 (ref 5–15)
BUN: 47 mg/dL — ABNORMAL HIGH (ref 6–20)
CO2: 20 mmol/L — ABNORMAL LOW (ref 22–32)
Calcium: 9.2 mg/dL (ref 8.9–10.3)
Chloride: 106 mmol/L (ref 98–111)
Creatinine, Ser: 2.21 mg/dL — ABNORMAL HIGH (ref 0.61–1.24)
GFR, Estimated: 33 mL/min — ABNORMAL LOW (ref >60)
Glucose, Bld: 149 mg/dL — ABNORMAL HIGH (ref 70–99)
Potassium: 4.3 mmol/L (ref 3.5–5.1)
Sodium: 139 mmol/L (ref 135–145)

## 2023-09-15 LAB — BRAIN NATRIURETIC PEPTIDE: B Natriuretic Peptide: 195.1 pg/mL — ABNORMAL HIGH (ref 0.0–100.0)

## 2023-09-15 MED ORDER — CARVEDILOL 6.25 MG PO TABS: 6.2500 mg | ORAL_TABLET | Freq: Two times a day (BID) | ORAL | 3 refills | Status: DC

## 2023-09-15 NOTE — Progress Notes (Signed)
Height:     Weight: BMI:  Today's Date:  STOP BANG RISK ASSESSMENT S (snore) Have you been told that you snore?     YES   T (tired) Are you often tired, fatigued, or sleepy during the day?   YES  O (obstruction) Do you stop breathing, choke, or gasp during sleep? NO   P (pressure) Do you have or are you being treated for high blood pressure? YES   B (BMI) Is your body index greater than 35 kg/m? YES   A (age) Are you 59 years old or older? YES   N (neck) Do you have a neck circumference greater than 16 inches?   NO   G (gender) Are you a male? YES   TOTAL STOP/BANG "YES" ANSWERS 6                                                                       For Office Use Only              Procedure Order Form    YES to 3+ Stop Bang questions OR two clinical symptoms - patient qualifies for WatchPAT (CPT 95800)      Clinical Notes: Will consult Sleep Specialist and refer for management of therapy due to patient increased risk of Sleep Apnea. Ordering a sleep study due to the following two clinical symptoms: Excessive daytime sleepiness G47.10 / Gastroesophageal reflux K21.9 / Nocturia R35.1 / Morning Headaches G44.221 / Difficulty concentrating R41.840 / Memory problems or poor judgment G31.84 / Personality changes or irritability R45.4 / Loud snoring R06.83 / Depression F32.9 / Unrefreshed by sleep G47.8 / Impotence N52.9 / History of high blood pressure R03.0 / Insomnia G47.00

## 2023-09-15 NOTE — Progress Notes (Signed)
ITAMAR home sleep study given to patient, all instructions explained, waiver signed, and CLOUDPAT registration complete.

## 2023-09-15 NOTE — Patient Instructions (Signed)
Medication Changes:  INCREASE CARVEDILOL TO 6.25MG  TWICE DAILY   Lab Work:  Labs done today, your results will be available in MyChart, we will contact you for abnormal readings.  Testing/Procedures:  Your provider has recommended that you have a home sleep study (Itamar Test).  We have provided you with the equipment in our office today. Please go ahead and download the app. DO NOT OPEN OR TAMPER WITH THE BOX UNTIL WE ADVISE YOU TO DO SO. Once insurance has approved the test our office will call you with PIN number and approval to proceed with testing. Once you have completed the test you just dispose of the equipment, the information is automatically uploaded to [place] via blue-tooth technology. If your test is positive for sleep apnea and you need a home CPAP machine you will be contacted by Dr Norris Cross office [facility]) to set this up. WE WILL CALL YOU ONCE WE HAVE INSURANCE APPROVAL- TO LET YOU KNOW IF OKAY TO PROCEED WITH TESTING  Follow-Up in: 1 MONTH WITH PHARMD AS SCHEDULED   THEN 2 MONTHS WITH DR. Gasper Lloyd WITH ECHO THE SAME DAY   At the Advanced Heart Failure Clinic, you and your health needs are our priority. We have a designated team specialized in the treatment of Heart Failure. This Care Team includes your primary Heart Failure Specialized Cardiologist (physician), Advanced Practice Providers (APPs- Physician Assistants and Nurse Practitioners), and Pharmacist who all work together to provide you with the care you need, when you need it.   You may see any of the following providers on your designated Care Team at your next follow up:  Dr. Arvilla Meres Dr. Marca Ancona Dr. Dorthula Nettles Dr. Theresia Bough Tonye Becket, NP Robbie Lis, [place] [facility] [place], [place] Brynda Peon, NP [place] Lee, NP Karle Plumber, PharmD   Please be sure to bring in all your medications bottles to every appointment.   Need to Contact us:  If you have any questions or  concerns before your next appointment please send [place] a message through [place] or call our office at [phone].    TO LEAVE A MESSAGE FOR THE NURSE SELECT OPTION 2, PLEASE LEAVE A MESSAGE INCLUDING: YOUR NAME DATE OF BIRTH CALL BACK NUMBER REASON FOR CALL**this is important as we prioritize the call backs  YOU WILL RECEIVE A CALL BACK THE SAME DAY AS LONG AS YOU CALL BEFORE 4:00 PM

## 2023-09-16 ENCOUNTER — Other Ambulatory Visit: Payer: Self-pay | Admitting: Adult Health

## 2023-09-24 ENCOUNTER — Encounter (HOSPITAL_COMMUNITY): Payer: Self-pay | Admitting: Cardiology

## 2023-10-06 NOTE — Progress Notes (Signed)
Advanced Heart Failure Clinic Note   Referring Physician: Fleet Contras, MD  Primary Care: Fleet Contras, MD Primary Cardiologist: Peter [place], MD HF Cardiologist: Dr. Gasper Lloyd  HPI:  Jordan Taylor is a 57 y.o. male with heart failure with reduced ejection fraction, type 2 diabetes, CKD and hypertension.  His cardiac history dates back to November 2022 when echocardiogram demonstrated EF of 40 to 45% with global hypokinesis.  In September 2024 he was seen in cardiology clinic where symptoms of shortness of breath were progressing.  He was sent to the emergency department where he was admitted for acute respiratory failure requiring BiPAP secondary to acute systolic heart failure and COVID-19 pneumonia.  Repeat echo at that time with a EF of 30 to 35%.  Right and left heart catheterization with preserved cardiac output and no obstructive CAD.  Since that time he has been seen in [facility] clinic where GDMT was further uptitrated and he was also found to be volume overloaded with class III symptoms.  No recent tobacco, alcohol or drug use.  No reported family history of CHF.  Currently works as a Production designer, theatre/television/film at [employer].   Presented to AHF Clinic for HF follow up 09/15/23. Reported that the above symptoms started after a trip to [place]; felt that he got COVID19 around this time. Over the past 2-3 weeks he noted his symptoms have continued to improve. He was able to complete shifts at [employer] now. His PND/orthopnea had improved   Today he returns to HF clinic for pharmacist medication titration. At last visit with MD, carvedilol was increased to 6.25 mg BID. Overall he is feeling well today. Able to walk from the parking lot to the clinic without getting SOB. Notes he can now walk 1/2 mile before getting SOB, which is an improvement. Now back working and works 10-12 hour days. No dizziness or lightheadedness. Has some fatigue after taking his medications, he takes a nap before work to help  with this. No CP or palpitations. Weight at home usually ~210-215 lbs. Has felt like he gained weight recently, which he attributes to dietary indiscretions. It is difficult to eat well while working at [employer], though he brings his food most days to help. He takes Lasix 40 mg every other day. He has used extra Lasix only about 2 times per month. No LEE, PND or orthopnea. Taking all medications as prescribed and tolerating all medications.     HF Medications: Carvedilol 6.25 mg BID Losartan 25 mg daily Jardiance 10 mg daily Bidil 0.5 tab TID Lasix 40 mg every other day  Has the patient been experiencing any side effects to the medications prescribed?  no  Does the patient have any problems obtaining medications due to transportation or finances?   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   Understanding of regimen: good Understanding of indications: good Potential of compliance: good Patient understands to avoid NSAIDs. Patient understands to avoid decongestants.    Pertinent Lab Values: 10/19/23: Serum creatinine 1.93, BUN 39, Potassium 4.8, Sodium 140, BNP 228.8  Vital Signs: Weight: 227.2 lbs (last clinic weight: 217.2 lbs) Blood pressure: 122/78  Heart rate: 95   Assessment/Plan: Heart failure with reduced EF Etiology of HF: Possibly secondary to viral myocarditis.Will plan on CMR if LVEF does not improve over the next 3 months with escalating GDMT.  NYHA class / AHA Stage:NYHA IIB Volume status & Diuretics: Change Lasix to PRN only  Vasodilators: Stop losartan and  start Entresto 24/26 mg BID. Continue BiDil half tab 3 times daily. Repeat BMET in two weeks.  Beta-Blocker: Continue carvedilol 6.25 mg BID.  MRA: Currently holding due to history of hyperkalemia Cardiometabolic: Continue Jardiance 10 mg daily Devices therapies & Valvulopathies:not currently indicated Advanced therapies:not currently indicated.    2.   Hypertension -Well-controlled on current regimen   3.  CKD 3A -Baseline creatinine around 1.7-2.  Scr 1.93 today -Likely hypertensive/diabetic.   4.  Type 2 diabetes -Most recent A1c 7.6%.  Following with PCP. -Continue Jardiance 10 mg daily   5.  Obesity -Body mass index is 36.14 kg/m.   6. Fatigue/obesity - Snoring at night with CHF - Referral for sleep study placed previously   Follow up two weeks with Pharmacy Clinic   Karle Plumber, PharmD, BCPS, BCCP, CPP Heart Failure Clinic Pharmacist [phone]

## 2023-10-13 DIAGNOSIS — E1122 Type 2 diabetes mellitus with diabetic chronic kidney disease: Secondary | ICD-10-CM | POA: Diagnosis not present

## 2023-10-13 DIAGNOSIS — I10 Essential (primary) hypertension: Secondary | ICD-10-CM | POA: Diagnosis not present

## 2023-10-13 DIAGNOSIS — I5022 Chronic systolic (congestive) heart failure: Secondary | ICD-10-CM | POA: Diagnosis not present

## 2023-10-19 ENCOUNTER — Ambulatory Visit (HOSPITAL_COMMUNITY)
Admission: RE | Admit: 2023-10-19 | Discharge: 2023-10-19 | Disposition: A | Discharge status: Home / Self Care | Payer: BC Managed Care – PPO | Source: Ambulatory Visit | Attending: Cardiology

## 2023-10-19 VITALS — BP 122/78 | HR 95 | Wt 227.2 lb

## 2023-10-19 DIAGNOSIS — I130 Hypertensive heart and chronic kidney disease with heart failure and stage 1 through stage 4 chronic kidney disease, or unspecified chronic kidney disease: Secondary | ICD-10-CM | POA: Insufficient documentation

## 2023-10-19 DIAGNOSIS — E1122 Type 2 diabetes mellitus with diabetic chronic kidney disease: Secondary | ICD-10-CM | POA: Insufficient documentation

## 2023-10-19 DIAGNOSIS — I5022 Chronic systolic (congestive) heart failure: Secondary | ICD-10-CM

## 2023-10-19 DIAGNOSIS — E669 Obesity, unspecified: Secondary | ICD-10-CM | POA: Diagnosis not present

## 2023-10-19 DIAGNOSIS — I5020 Unspecified systolic (congestive) heart failure: Secondary | ICD-10-CM | POA: Insufficient documentation

## 2023-10-19 DIAGNOSIS — R5383 Other fatigue: Secondary | ICD-10-CM | POA: Insufficient documentation

## 2023-10-19 DIAGNOSIS — N1831 Chronic kidney disease, stage 3a: Secondary | ICD-10-CM | POA: Diagnosis not present

## 2023-10-19 DIAGNOSIS — Z6836 Body mass index (BMI) 36.0-36.9, adult: Secondary | ICD-10-CM | POA: Insufficient documentation

## 2023-10-19 LAB — BASIC METABOLIC PANEL
Anion gap: 9 (ref 5–15)
BUN: 39 mg/dL — ABNORMAL HIGH (ref 6–20)
CO2: 19 mmol/L — ABNORMAL LOW (ref 22–32)
Calcium: 9.1 mg/dL (ref 8.9–10.3)
Chloride: 112 mmol/L — ABNORMAL HIGH (ref 98–111)
Creatinine, Ser: 1.93 mg/dL — ABNORMAL HIGH (ref 0.61–1.24)
GFR, Estimated: 39 mL/min — ABNORMAL LOW (ref >60)
Glucose, Bld: 114 mg/dL — ABNORMAL HIGH (ref 70–99)
Potassium: 4.8 mmol/L (ref 3.5–5.1)
Sodium: 140 mmol/L (ref 135–145)

## 2023-10-19 LAB — BRAIN NATRIURETIC PEPTIDE: B Natriuretic Peptide: 228.8 pg/mL — ABNORMAL HIGH (ref 0.0–100.0)

## 2023-10-19 MED ORDER — FUROSEMIDE 40 MG PO TABS: 40.0000 mg | ORAL_TABLET | Freq: Every day | ORAL | 3 refills | Status: DC | PRN

## 2023-10-19 MED ORDER — ENTRESTO 24-26 MG PO TABS: 1.0000 | ORAL_TABLET | Freq: Two times a day (BID) | ORAL | 3 refills | Status: DC

## 2023-10-19 MED ORDER — CARVEDILOL 6.25 MG PO TABS: 6.2500 mg | ORAL_TABLET | Freq: Two times a day (BID) | ORAL | 3 refills | Status: DC

## 2023-10-19 NOTE — Progress Notes (Signed)
Advanced Heart Failure Clinic Note   Referring Physician: Fleet Contras, MD  Primary Care: Jordan Contras, MD Primary Cardiologist: Jordan [place], MD HF Cardiologist: Dr. Gasper Taylor  HPI:  Jordan Taylor is a 57 y.o. male with heart failure with reduced ejection fraction, type 2 diabetes, CKD and hypertension.  His cardiac history dates back to November 2022 when echocardiogram demonstrated EF of 40 to 45% with global hypokinesis.  In September 2024 he was seen in cardiology clinic where symptoms of shortness of breath were progressing.  He was sent to the emergency department where he was admitted for acute respiratory failure requiring BiPAP secondary to acute systolic heart failure and COVID-19 pneumonia.  Repeat echo at that time with a EF of 30 to 35%.  Right and left heart catheterization with preserved cardiac output and no obstructive CAD.  Since that time he has been seen in [name] clinic where GDMT was further uptitrated and he was also found to be volume overloaded with class III symptoms.  No recent tobacco, alcohol or drug use.  No reported family history of CHF.  Currently works as a Production designer, theatre/television/film at [employer].   Presented to AHF Clinic for HF follow up 09/15/23. Reported that the above symptoms started after a trip to [place]; felt that he got COVID19 around this time. Over the past 2-3 weeks he noted his symptoms have continued to improve. He was able to complete shifts at [employer] now. His PND/orthopnea had improved   Presented to AHF Clinic for pharmacy clinic follow up 10/19/23. Overall he was feeling well. Had been able to walk from the parking lot to the clinic without getting SOB. Noted he can now walk 1/2 mile before getting SOB, which is an improvement.  No dizziness or lightheadedness. Had some fatigue after taking his medications, he takes a nap before work to help with this. No CP or palpitations. Weight at home was ~210-215 lbs. Felt like he gained weight recently,  which he attributed to dietary indiscretions. Reported that he takes Lasix 40 mg every other day. Had used extra Lasix only about 2 times per month. No LEE, PND or orthopnea.   Today he returns to HF clinic for pharmacist medication titration. At last visit with pharmacy clinic, losartan was discontinued and Entresto 24/26 mg BID was initiated. Additionally, Lasix was decreased to PRN only. Overall he is feeling well today. No dizziness, lightheadedness, CP or palpitations. No issues with SOB on flat ground but did notice SOB when going up and down the stadium steps at the football game. Has taken PRN Lasix twice since last visit. Needed this for swelling and abdominal bloating after the Thanksgiving holiday. He ate food with more salt than his normal and also drank more juice and soda at this time. Believes he may be carrying some fluid in his abdomen now. Weight is up 3 lbs from last visit. No LEE on exam today. No PND or orthopnea. Taking all medications as prescribed and tolerating all medications.    HF Medications: Carvedilol 6.25 mg BID Entresto 24/26 mg BID Jardiance 10 mg daily Bidil 0.5 tab TID Lasix 40 mg PRN  Has the patient been experiencing any side effects to the medications prescribed?  no  Does the patient have any problems obtaining medications due to transportation or finances?   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   Understanding of regimen: good Understanding of indications: good Potential of compliance: good Patient understands to avoid NSAIDs. Patient  understands to avoid decongestants.    Pertinent Lab Values: 10/19/23: Serum creatinine 1.93, BUN 39, Potassium 4.8, Sodium 140, BNP 228.8 BMET today pending  Vital Signs:  Weight: 230.8 lbs (last clinic weight: 227.2 lbs) Blood pressure: 128/74  Heart rate: 108   Assessment/Plan: Heart failure with reduced EF Etiology of HF: Possibly secondary to viral myocarditis.Will plan on CMR if  LVEF does not improve over the next 3 months with escalating GDMT.  NYHA class / AHA Stage:NYHA IIB Volume status & Diuretics: Volume status mildly elevated. Take 40 mg of Lasix x2 days then continue Lasix 40 mg PRN only  Vasodilators: Continue Entresto 24/26 mg BID. Will consider increasing Entresto today as well pending labs. Continue BiDil half tab 3 times daily. Beta-Blocker: Increase carvedilol to 12.5 mg BID. Will wait until Friday to increase given mildly elevated volume status today. MRA: Currently holding due to history of hyperkalemia Cardiometabolic: Continue Jardiance 10 mg daily Devices therapies & Valvulopathies:not currently indicated Advanced therapies:not currently indicated.  -BMET today pending   2.  Hypertension -Well-controlled on current regimen   3.  CKD 3A -Baseline creatinine around 1.7-2.  Scr 1.93 10/19/23 -Likely hypertensive/diabetic. - BMET today pending   4.  Type 2 diabetes -Most recent A1c 7.6%.  Following with PCP. -Continue Jardiance 10 mg daily   5.  Obesity -Body mass index is 36.14 kg/m.   6. Fatigue/obesity - Snoring at night with CHF - Referral for sleep study placed previously   Follow up in four weeks with Dr. Gasper Taylor.    Karle Plumber, PharmD, BCPS, [facility], CPP Heart Failure Clinic Pharmacist [phone]

## 2023-10-19 NOTE — Patient Instructions (Signed)
It was a pleasure seeing you today!  MEDICATIONS: -We are changing your medications today -Stop losartan.  -Start Entresto 24/26 mg (1 tablet) twice daily -Change Lasix to only take 40 mg (1 tablet) as needed for extra fluid -Call if you have questions about your medications.  LABS: -We will call you if your labs need attention.  NEXT APPOINTMENT: Return to clinic in 2 weeks with Pharmacy Clinic.  In general, to take care of your heart failure: -Limit your fluid intake to 2 Liters (half-gallon) per day.   -Limit your salt intake to ideally 2-3 grams (2000-3000 mg) per day. -Weigh yourself daily and record, and bring that "weight diary" to your next appointment.  (Weight gain of 2-3 pounds in 1 day typically means fluid weight.) -The medications for your heart are to help your heart and help you live longer.   -Please contact us before stopping any of your heart medications.  Call the clinic at [phone] with questions or to reschedule future appointments.

## 2023-11-01 ENCOUNTER — Encounter (INDEPENDENT_AMBULATORY_CARE_PROVIDER_SITE_OTHER): Payer: BC Managed Care – PPO | Admitting: Cardiology

## 2023-11-01 DIAGNOSIS — G4733 Obstructive sleep apnea (adult) (pediatric): Secondary | ICD-10-CM | POA: Diagnosis not present

## 2023-11-02 ENCOUNTER — Ambulatory Visit (HOSPITAL_COMMUNITY)
Admission: RE | Admit: 2023-11-02 | Discharge: 2023-11-02 | Disposition: A | Discharge status: Home / Self Care | Payer: BC Managed Care – PPO | Source: Ambulatory Visit | Attending: Cardiology | Admitting: Cardiology

## 2023-11-02 ENCOUNTER — Other Ambulatory Visit (HOSPITAL_COMMUNITY): Payer: Self-pay | Admitting: Pharmacist

## 2023-11-02 VITALS — BP 128/74 | HR 108 | Wt 230.8 lb

## 2023-11-02 DIAGNOSIS — R0683 Snoring: Secondary | ICD-10-CM | POA: Insufficient documentation

## 2023-11-02 DIAGNOSIS — Z6836 Body mass index (BMI) 36.0-36.9, adult: Secondary | ICD-10-CM | POA: Diagnosis not present

## 2023-11-02 DIAGNOSIS — Z79899 Other long term (current) drug therapy: Secondary | ICD-10-CM | POA: Diagnosis not present

## 2023-11-02 DIAGNOSIS — E1122 Type 2 diabetes mellitus with diabetic chronic kidney disease: Secondary | ICD-10-CM | POA: Diagnosis not present

## 2023-11-02 DIAGNOSIS — Z8616 Personal history of COVID-19: Secondary | ICD-10-CM | POA: Diagnosis not present

## 2023-11-02 DIAGNOSIS — I5022 Chronic systolic (congestive) heart failure: Secondary | ICD-10-CM | POA: Diagnosis not present

## 2023-11-02 DIAGNOSIS — Z7984 Long term (current) use of oral hypoglycemic drugs: Secondary | ICD-10-CM | POA: Diagnosis not present

## 2023-11-02 DIAGNOSIS — N1831 Chronic kidney disease, stage 3a: Secondary | ICD-10-CM | POA: Insufficient documentation

## 2023-11-02 DIAGNOSIS — I130 Hypertensive heart and chronic kidney disease with heart failure and stage 1 through stage 4 chronic kidney disease, or unspecified chronic kidney disease: Secondary | ICD-10-CM | POA: Insufficient documentation

## 2023-11-02 DIAGNOSIS — R5383 Other fatigue: Secondary | ICD-10-CM | POA: Diagnosis not present

## 2023-11-02 DIAGNOSIS — E669 Obesity, unspecified: Secondary | ICD-10-CM | POA: Diagnosis not present

## 2023-11-02 LAB — BASIC METABOLIC PANEL
Anion gap: 10 (ref 5–15)
BUN: 39 mg/dL — ABNORMAL HIGH (ref 6–20)
CO2: 18 mmol/L — ABNORMAL LOW (ref 22–32)
Calcium: 8.7 mg/dL — ABNORMAL LOW (ref 8.9–10.3)
Chloride: 111 mmol/L (ref 98–111)
Creatinine, Ser: 2 mg/dL — ABNORMAL HIGH (ref 0.61–1.24)
GFR, Estimated: 38 mL/min — ABNORMAL LOW (ref >60)
Glucose, Bld: 85 mg/dL (ref 70–99)
Potassium: 4.9 mmol/L (ref 3.5–5.1)
Sodium: 139 mmol/L (ref 135–145)

## 2023-11-02 MED ORDER — ENTRESTO 49-51 MG PO TABS: 1.0000 | ORAL_TABLET | Freq: Two times a day (BID) | ORAL | 3 refills | Status: DC

## 2023-11-02 MED ORDER — CARVEDILOL 12.5 MG PO TABS: 12.5000 mg | ORAL_TABLET | Freq: Two times a day (BID) | ORAL | 3 refills | Status: DC

## 2023-11-02 NOTE — Progress Notes (Signed)
BMET stable. Will increase Entresto to 49/51 mg BID and repeat BMET at follow up. Patient expressed understanding.

## 2023-11-02 NOTE — Patient Instructions (Signed)
It was a pleasure seeing you today!  MEDICATIONS: -We are changing your medications today -Take Lasix (furosemide) 40 mg (1 tablet) for two days, then resume taking as needed only.  -Increase carvedilol to 12.5 mg (1 tablet) twice daily. Wait to start the increased dose until Friday. You may take two tablets of the 6.25 mg strength twice daily until you pick up the new strength.  -Call if you have questions about your medications.  LABS: -We will call you if your labs need attention.  NEXT APPOINTMENT: Return to clinic in 4 weeks with Dr. Gasper Lloyd.  In general, to take care of your heart failure: -Limit your fluid intake to 2 Liters (half-gallon) per day.   -Limit your salt intake to ideally 2-3 grams (2000-3000 mg) per day. -Weigh yourself daily and record, and bring that "weight diary" to your next appointment.  (Weight gain of 2-3 pounds in 1 day typically means fluid weight.) -The medications for your heart are to help your heart and help you live longer.   -Please contact us before stopping any of your heart medications.  Call the clinic at [phone] with questions or to reschedule future appointments.

## 2023-11-03 ENCOUNTER — Ambulatory Visit: Payer: BC Managed Care – PPO | Attending: Physician Assistant

## 2023-11-03 ENCOUNTER — Telehealth: Payer: Self-pay

## 2023-11-03 DIAGNOSIS — G4733 Obstructive sleep apnea (adult) (pediatric): Secondary | ICD-10-CM

## 2023-11-03 DIAGNOSIS — I5020 Unspecified systolic (congestive) heart failure: Secondary | ICD-10-CM

## 2023-11-03 NOTE — Telephone Encounter (Signed)
-----   Message from Armanda Magic sent at 11/03/2023 10:23 AM EST ----- Please let patient know that they have sleep apnea.  Recommend therapeutic CPAP titration for treatment of patient's sleep disordered breathing.  If unable to perform an in lab titration then initiate ResMed auto CPAP from 4 to 15cm H2O with heated humidity and mask of choice and overnight pulse ox on CPAP.

## 2023-11-03 NOTE — Telephone Encounter (Signed)
Notified patient of sleep study results and recommendations. All questions were answered and patient verbalized understanding. CPAP Titration ordered today.

## 2023-11-03 NOTE — Procedures (Signed)
   SLEEP STUDY REPORT Patient Information Study Date: 11/01/2023 Patient Name: Jordan Taylor Patient ID: [id number] Birth Date: 06/16/1966 Age: 57 Gender: Male BMI: 36.4 (W=218 lb, H=5' 5'') Stopbang: 6 Referring Physician: Loletta Specter, DO  TEST DESCRIPTION: Home sleep apnea testing was completed using the WatchPat, a Type 1 device, utilizing peripheral arterial tonometry (PAT), chest movement, actigraphy, pulse oximetry, pulse rate, body position and snore. AHI was calculated with apnea and hypopnea using valid sleep time as the denominator. RDI includes apneas, hypopneas, and RERAs. The data acquired and the scoring of sleep and all associated events were performed in accordance with the recommended standards and specifications as outlined in the AASM Manual for the Scoring of Sleep and Associated Events 2.2.0 (2015).  FINDINGS:  1. Mild Obstructive Sleep Apnea with AHI 12.7/hr but moderate during REM sleep with REM AHi 23.6/hr.  2. No Central Sleep Apnea with pAHIc 0.8/hr.  3. Oxygen desaturations as low as 82%.  4. Severe snoring was present. O2 sats were < 88% for 64 min.  5. Total sleep time was 7 hrs and 56 min.  6. 27.6% of total sleep time was spent in REM sleep.  7. Normal sleep onset latency at 19 min.  8. Shortened REM sleep onset latency at 74 min.  9. Total awakenings were 16. 10. Arrhythmia detection: None  DIAGNOSIS: Mild Obstructive Sleep Apnea (G47.33) and Moderate Obstructive Sleep Apnea during REM sleep. Nocturnal Hypoxemia  RECOMMENDATIONS: 1. Clinical correlation of these findings is necessary. The decision to treat obstructive sleep apnea (OSA) is usually based on the presence of apnea symptoms or the presence of associated medical conditions such as Hypertension, Congestive Heart Failure, Atrial Fibrillation or Obesity. The most common symptoms of OSA are snoring, gasping for breath while sleeping, daytime sleepiness and fatigue.  2.  Initiating apnea therapy is recommended given the presence of symptoms and/or associated conditions. Recommend proceeding with one of the following:   a. Auto-CPAP therapy with a pressure range of 5-20cm H2O.   b. An oral appliance (OA) that can be obtained from certain dentists with expertise in sleep medicine. These are primarily of use in non-obese patients with mild and moderate disease.   c. An ENT consultation which may be useful to look for specific causes of obstruction and possible treatment options.   d. If patient is intolerant to PAP therapy, consider referral to ENT for evaluation for hypoglossal nerve stimulator.  3. Close follow-up is necessary to ensure success with CPAP or oral appliance therapy for maximum benefit .  4. A follow-up oximetry study on CPAP is recommended to assess the adequacy of therapy and determine the need for supplemental oxygen or the potential need for Bi-level therapy. An arterial blood gas to determine the adequacy of baseline ventilation and oxygenation should also be considered  5. Healthy sleep recommendations include: adequate nightly sleep (normal 7-9 hrs/night), avoidance of caffeine after noon and alcohol near bedtime, and maintaining a sleep environment that is cool, dark and quiet.  6. Weight loss for overweight patients is recommended. Even modest amounts of weight loss can significantly improve the severity of sleep apnea.  7. Snoring recommendations include: weight loss where appropriate, side sleeping, and avoidance of alcohol before bed.  8. Operation of motor vehicle should be avoided when sleepy.  Signature: Armanda Magic, MD; [facility]; Diplomat, American Board of Sleep Medicine Electronically Signed: 11/03/2023 10:21:26 AM

## 2023-11-05 ENCOUNTER — Telehealth: Payer: Self-pay

## 2023-11-05 ENCOUNTER — Other Ambulatory Visit (HOSPITAL_COMMUNITY): Payer: Self-pay

## 2023-11-05 DIAGNOSIS — I5022 Chronic systolic (congestive) heart failure: Secondary | ICD-10-CM

## 2023-11-05 MED ORDER — FUROSEMIDE 40 MG PO TABS: 40.0000 mg | ORAL_TABLET | Freq: Every day | ORAL | 3 refills | Status: DC | PRN

## 2023-11-08 DIAGNOSIS — E1122 Type 2 diabetes mellitus with diabetic chronic kidney disease: Secondary | ICD-10-CM | POA: Diagnosis not present

## 2023-11-08 NOTE — Telephone Encounter (Signed)
Spoke to patient Dr.Jordan advised do not take Metoprolol.Continue Carvedilol 12.5 mg twice a day.

## 2023-11-09 ENCOUNTER — Other Ambulatory Visit (HOSPITAL_COMMUNITY): Payer: Self-pay

## 2023-11-09 DIAGNOSIS — I5022 Chronic systolic (congestive) heart failure: Secondary | ICD-10-CM

## 2023-11-09 MED ORDER — CARVEDILOL 12.5 MG PO TABS: 12.5000 mg | ORAL_TABLET | Freq: Two times a day (BID) | ORAL | 3 refills | Status: DC

## 2023-11-30 ENCOUNTER — Encounter (HOSPITAL_COMMUNITY): Payer: Self-pay | Admitting: Cardiology

## 2023-11-30 ENCOUNTER — Ambulatory Visit (HOSPITAL_COMMUNITY)
Admission: RE | Admit: 2023-11-30 | Discharge: 2023-11-30 | Disposition: A | Discharge status: Home / Self Care | Payer: BC Managed Care – PPO | Source: Ambulatory Visit | Attending: Cardiology | Admitting: Cardiology

## 2023-11-30 ENCOUNTER — Ambulatory Visit (HOSPITAL_COMMUNITY)
Admission: RE | Admit: 2023-11-30 | Discharge: 2023-11-30 | Disposition: A | Discharge status: Home / Self Care | Payer: BC Managed Care – PPO | Source: Ambulatory Visit | Attending: Cardiology

## 2023-11-30 VITALS — BP 106/68 | HR 102 | Ht 65.0 in | Wt 229.2 lb

## 2023-11-30 DIAGNOSIS — I10 Essential (primary) hypertension: Secondary | ICD-10-CM | POA: Diagnosis not present

## 2023-11-30 DIAGNOSIS — Z79899 Other long term (current) drug therapy: Secondary | ICD-10-CM | POA: Diagnosis not present

## 2023-11-30 DIAGNOSIS — G4733 Obstructive sleep apnea (adult) (pediatric): Secondary | ICD-10-CM | POA: Insufficient documentation

## 2023-11-30 DIAGNOSIS — E785 Hyperlipidemia, unspecified: Secondary | ICD-10-CM | POA: Insufficient documentation

## 2023-11-30 DIAGNOSIS — I130 Hypertensive heart and chronic kidney disease with heart failure and stage 1 through stage 4 chronic kidney disease, or unspecified chronic kidney disease: Secondary | ICD-10-CM | POA: Insufficient documentation

## 2023-11-30 DIAGNOSIS — N1831 Chronic kidney disease, stage 3a: Secondary | ICD-10-CM | POA: Insufficient documentation

## 2023-11-30 DIAGNOSIS — E669 Obesity, unspecified: Secondary | ICD-10-CM | POA: Insufficient documentation

## 2023-11-30 DIAGNOSIS — Z7984 Long term (current) use of oral hypoglycemic drugs: Secondary | ICD-10-CM | POA: Diagnosis not present

## 2023-11-30 DIAGNOSIS — E1122 Type 2 diabetes mellitus with diabetic chronic kidney disease: Secondary | ICD-10-CM | POA: Diagnosis not present

## 2023-11-30 DIAGNOSIS — Z6838 Body mass index (BMI) 38.0-38.9, adult: Secondary | ICD-10-CM | POA: Diagnosis not present

## 2023-11-30 DIAGNOSIS — I5020 Unspecified systolic (congestive) heart failure: Secondary | ICD-10-CM | POA: Insufficient documentation

## 2023-11-30 DIAGNOSIS — I5022 Chronic systolic (congestive) heart failure: Secondary | ICD-10-CM

## 2023-11-30 DIAGNOSIS — Z006 Encounter for examination for normal comparison and control in clinical research program: Secondary | ICD-10-CM

## 2023-11-30 LAB — ECHOCARDIOGRAM COMPLETE
AR max vel: 3.2 cm2
AV Area VTI: 3.3 cm2
AV Area mean vel: 3.39 cm2
AV Mean grad: 3 mm[Hg]
AV Peak grad: 5.8 mm[Hg]
Ao pk vel: 1.2 m/s
Area-P 1/2: 7.22 cm2
Calc EF: 46.7 %
S' Lateral: 3.8 cm
Single Plane A2C EF: 53.8 %
Single Plane A4C EF: 41.9 %

## 2023-11-30 LAB — BASIC METABOLIC PANEL
Anion gap: 11 (ref 5–15)
BUN: 46 mg/dL — ABNORMAL HIGH (ref 6–20)
CO2: 19 mmol/L — ABNORMAL LOW (ref 22–32)
Calcium: 9.5 mg/dL (ref 8.9–10.3)
Chloride: 108 mmol/L (ref 98–111)
Creatinine, Ser: 2.47 mg/dL — ABNORMAL HIGH (ref 0.61–1.24)
GFR, Estimated: 29 mL/min — ABNORMAL LOW (ref >60)
Glucose, Bld: 96 mg/dL (ref 70–99)
Potassium: 4.7 mmol/L (ref 3.5–5.1)
Sodium: 138 mmol/L (ref 135–145)

## 2023-11-30 LAB — BRAIN NATRIURETIC PEPTIDE: B Natriuretic Peptide: 129.8 pg/mL — ABNORMAL HIGH (ref 0.0–100.0)

## 2023-11-30 MED ORDER — CARVEDILOL 25 MG PO TABS: 25.0000 mg | ORAL_TABLET | Freq: Two times a day (BID) | ORAL | 3 refills | Status: DC

## 2023-11-30 MED ORDER — PERFLUTREN LIPID MICROSPHERE
1.0000 mL | INTRAVENOUS | Status: AC | PRN
  Administered 2023-11-30: 3 mL via INTRAVENOUS

## 2023-11-30 MED ORDER — ENTRESTO 97-103 MG PO TABS: 1.0000 | ORAL_TABLET | Freq: Two times a day (BID) | ORAL | 3 refills | Status: AC

## 2023-11-30 NOTE — Patient Instructions (Signed)
 Medication Changes:  INCREASE CARVEDILOL  TO 25MG  TWICE DAILY   INCREASE ENTRESTO  TO 97/103MG  TWICE DAILY   STOP TAKING BIDIL- ISOSORBIDE/HYDRALAZINE   Lab Work:  Labs done today, your results will be available in MyChart, we will contact you for abnormal readings.  Follow-Up in: 2 MONTHS WITH ECHO AS SCHEDULED   At the [facility], you and your health needs are our priority. We have a designated team specialized in the treatment of Heart Failure. This Care Team includes your primary Heart Failure Specialized Cardiologist (physician), Advanced Practice Providers (APPs- Physician Assistants and Nurse Practitioners), and Pharmacist who all work together to provide you with the care you need, when you need it.   You may see any of the following providers on your designated Care Team at your next follow up:  Dr. Toribio Fuel Dr. Ezra Shuck Dr. Ria Commander Dr. Odis Brownie Greig Mosses, NP Caffie Shed, [place] [facility] [place], [place] Beckey Coe, NP Jordan Lee, NP Tinnie Redman, PharmD   Please be sure to bring in all your medications bottles to every appointment.   Need to Contact Us :  If you have any questions or concerns before your next appointment please send us  a message through Sikes or call our office at [phone].    TO LEAVE A MESSAGE FOR THE NURSE SELECT OPTION 2, PLEASE LEAVE A MESSAGE INCLUDING: YOUR NAME DATE OF BIRTH CALL BACK NUMBER REASON FOR CALL**this is important as we prioritize the call backs  YOU WILL RECEIVE A CALL BACK THE SAME DAY AS LONG AS YOU CALL BEFORE 4:00 PM

## 2023-11-30 NOTE — Progress Notes (Signed)
 ADVANCED HEART FAILURE CLINIC NOTE  Referring Physician: Shelda Atlas, MD  Primary Care: Shelda Atlas, MD Primary Cardiologist: Peter Jordan, MD  HPI: Jordan Taylor is a 57 y.o. male with heart failure with reduced ejection fraction, type 2 diabetes, CKD, hypertension presenting today to establish care.  His cardiac history dates back to November 2022 when echocardiogram demonstrated EF of 40 to 45% with global hypokinesis.  In September 2024 he was seen in cardiology clinic where symptoms of shortness of breath were progressing.  He was sent to the emergency department where he was admitted for acute respiratory failure requiring BiPAP secondary to acute systolic heart failure and COVID-19 pneumonia.  Repeat echo at that time with a EF of 30 to 35%.  Right and left heart catheterization with preserved cardiac output and no obstructive CAD.  Since that time he has been seen in [facility] clinic where GDMT was further uptitrated and he was also found to be volume overloaded with class III symptoms.  No recent tobacco, alcohol or drug use.  No reported family history of CHF.  Currently works as a production designer, theatre/television/film at [employer].  Interval hx:  - Repeat echo today with recovery of LVEF; complaint with meds. Doing very well from a functional standpoint with no SOB, CP, lightheadedness, LE edema or recent hospitalizations.   Activity level/exercise tolerance:  NYHA II Orthopnea:  Sleeps on 2 pillows Paroxysmal noctural dyspnea:  No Chest pain/pressure:  No Orthostatic lightheadedness:  No Palpitations:  No Lower extremity edema:  No Presyncope/syncope:  No Cough:  No  Past Medical History:  Diagnosis Date   Essential hypertension 12/04/2013   Hyperlipidemia    Type 2 diabetes mellitus (HCC) 12/04/2013    Current Outpatient Medications  Medication Sig Dispense Refill   albuterol  (VENTOLIN  HFA) 108 (90 Base) MCG/ACT inhaler Inhale 2 puffs into the lungs every 6 (six) hours as needed for  wheezing. 2 each 11   carvedilol  (COREG ) 12.5 MG tablet Take 1 tablet (12.5 mg total) by mouth 2 (two) times daily. 180 tablet 3   empagliflozin  (JARDIANCE ) 10 MG TABS tablet Take 1 tablet (10 mg total) by mouth daily. 90 tablet 3   ferrous fumarate (HEMOCYTE - 106 MG FE) 325 (106 Fe) MG TABS tablet Take 1 tablet by mouth daily.     Fluticasone -Umeclidin-Vilant (TRELEGY ELLIPTA) 100-62.5-25 MCG/ACT AEPB Inhale 1 puff into the lungs as needed.     furosemide  (LASIX ) 40 MG tablet Take 1 tablet (40 mg total) by mouth daily as needed. 90 tablet 3   isosorbide-hydrALAZINE  (BIDIL) 20-37.5 MG tablet Take 0.5 tablets by mouth 3 (three) times daily. 180 tablet 3   sacubitril-valsartan (ENTRESTO ) 49-51 MG Take 1 tablet by mouth 2 (two) times daily. 180 tablet 3   sodium bicarbonate  650 MG tablet Take 1 tablet (650 mg total) by mouth 2 (two) times daily. 180 tablet 3   tamsulosin  (FLOMAX ) 0.4 MG CAPS capsule TAKE ONE CAPSULE BY MOUTH DAILY AFTER BREAKFAST 30 capsule 2   TRESIBA  FLEXTOUCH 100 UNIT/ML FlexTouch Pen Inject 15 Units into the skin daily.     VITAMIN D  PO Take by mouth daily.     VITAMIN E PO Take by mouth daily.     No current facility-administered medications for this encounter.   Facility-Administered Medications Ordered in Other Encounters  Medication Dose Route Frequency Provider Last Rate Last Admin   perflutren  lipid microspheres (DEFINITY ) IV suspension  1-10 mL Intravenous PRN Haani Bakula, DO   3 mL at  11/30/23 1000    Allergies  Allergen Reactions   Amoxicillin  Swelling   Atorvastatin  Other (See Comments)    Severe muscle pain/Weakness   Penicillins Swelling      Social History   Socioeconomic History   Marital status: Divorced    Spouse name: [name]   Number of children: 5   Years of education: College   Highest education level: Not on file  Occupational History   Occupation: Clinical Cytogeneticist: [employer]  Tobacco Use   Smoking  status: Former   Smokeless tobacco: Never  Advertising Account Planner   Vaping status: Never Used  Substance and Sexual Activity   Alcohol use: No    Alcohol/week: 0.0 standard drinks of alcohol   Drug use: No    Comment: use CBD   Sexual activity: Yes    Partners: Female  Other Topics Concern   Not on file  Social History Narrative   Lives at home w/ his wife   Right-handed   Drinks about 10 cans of soda per day      Engineer, water.    Social Drivers of Corporate Investment Banker Strain: Not on file  Food Insecurity: Patient Declined (08/05/2023)   Hunger Vital Sign    Worried About Running Out of Food in the Last Year: Patient declined    Ran Out of Food in the Last Year: Patient declined  Transportation Needs: Patient Declined (08/05/2023)   PRAPARE - Administrator, Civil Service (Medical): Patient declined    Lack of Transportation (Non-Medical): Patient declined  Physical Activity: Not on file  Stress: Not on file  Social Connections: Unknown (07/29/2023)   Received from [facility]   Social Network    Social Network: Not on file  Intimate Partner Violence: Patient Declined (08/05/2023)   Humiliation, Afraid, Rape, and Kick questionnaire    Fear of Current or Ex-Partner: Patient declined    Emotionally Abused: Patient declined    Physically Abused: Patient declined    Sexually Abused: Patient declined      Family History  Problem Relation Age of Onset   Alcohol abuse Mother    Cancer Mother    Alcohol abuse Father    Diabetes Father    Hypertension Father    Cancer Sister    Cancer Other     PHYSICAL EXAM: Vitals:   11/30/23 1008  BP: 106/68  Pulse: (!) 102  SpO2: 94%   GENERAL: Well nourished, well developed, and in no apparent distress at rest.  HEENT: There is no scleral icterus.  The mucous membranes are pink and moist.   CHEST: There are no chest wall deformities. There is no chest wall tenderness. Respirations are unlabored.  Lungs- CTA  B/L CARDIAC:  JVP: 7 cm          Normal rate with regular rhythm. No murmur.  Pulses are 2+ and symmetrical in upper and lower extremities. No edema.  ABDOMEN: Soft, non-tender, non-distended. There are normal bowel sounds.  EXTREMITIES: Warm and well perfused.  NEUROLOGIC: Patient is oriented x3 with no obvious focal neurologic deficits.  PSYCH: Patients affect is appropriate SKIN: Warm and dry; no lesions or wounds.    DATA REVIEW  ECG: 925/25: NSR  as per my personal interpretation  ECHO: 10/2021: LVEF 55 to 60%  9/24: LVEF 30 to 35%, preserved RV function 11/30/23: LVEF 55-60%  CATH: 08/06/23:  No angiographic evidence of CAD RA 4 RV 39/5/9 PA  36/20 mean 27 PCWP 12 LV 94/26/12 AO 105/70 CO 7.13 L/min CI 3.4    ASSESSMENT & PLAN:  Heart failure with reduced EF Etiology of HF: Possibly secondary to viral myocarditis.Will plan on CMR if LVEF does not improve over the next 3 months with escalating GDMT.  NYHA class / AHA Stage:NYHA IIB Volume status & Diuretics: Lasix  40mg  PRN only Vasodilators: Entresto  49/51mg  BID and Bidil half tab TID; will increase Entresto  to 97/103mg  BID and D/C Bidil.  Beta-Blocker: Coreg  12.5mg  twice daily; we will increase to 25mg  BID MRA: Currently holding due to history of hyperkalemia Cardiometabolic: Jardiance  10 mg daily Devices therapies & Valvulopathies:not currently indicated; repeat TTE today (11/30/23) with improvement in LVEF to 50%.  Advanced therapies:not currently indicated.   2.  Hypertension -Well controlled now -D/C Bidil -Increase Entresto  to 97/103mg  BID -Increase coreg  to 25mg  BID.  -BMP/BNP today.  3.  CKD 3A -Baseline creatinine around 1.7-2.  Repeat lab work today. -Likely hypertensive/diabetic. - sCr up to 2.22 on labs from 09/01/23; will repeat today. Lasix  was reduced to every other day.   4.  Type 2 diabetes -Most recent A1c 7.6%.  Following with PCP. -Jardiance  10 mg daily  5.  Obesity -Body mass index  is 38.14 kg/m. -We discussed importance of weight loss; he has now started Ozempic.   6. OSA - Followed by Wilbert Bihari now - Wearing CPAP nightly.   I spent 35 minutes caring for this patient today including face to face time, ordering and reviewing labs, reviewing echocardiogram from today with him, seeing the patient, documenting in the record, and arranging follow ups.     Yazleen Molock Advanced [facility] Failure Mechanical Circulatory Support

## 2023-11-30 NOTE — Research (Addendum)
 SITE: [id number]     Subject # [id number]   Subprotocol: A  Inclusion Criteria  Patients who meet all of the following criteria are eligible for enrollment as study participants:  Yes No  Age > 59 years old X   Eligible to wear Holter Study X    Exclusion Criteria  Patients who meet any of these criteria are not eligible for enrollment as study participants: Yes No  1. Receiving any mechanical (respiratory or circulatory) or renal support therapy at Screening or during Visit #1.  X  2.  Any other conditions that in the opinion of the investigators are likely to prevent compliance with the study protocol or pose a safety concern if the subject participates in the study.  X  3. Poor tolerance, namely susceptible to severe skin allergies from ECG adhesive patch application.  X   Protocol: REV H                                     Residential Zip [address] (First 3 digits ONLY)                                             [facility] Informed Consent   Subject Name: Jordan Taylor  Subject met inclusion and exclusion criteria.  The informed consent form, study requirements and expectations were reviewed with the subject. Subject had opportunity to read consent and questions and concerns were addressed prior to the signing of the consent form.  The subject verbalized understanding of the trial requirements.  The subject agreed to participate in the [facility] EF ACT trial and signed the informed consent at 10:10 on 30-Nov-2023.  The informed consent was obtained prior to performance of any protocol-specific procedures for the subject.  A copy of the signed informed consent was given to the subject and a copy was placed in the subject's medical record.   Jordan Taylor          Current Outpatient Medications:    albuterol  (VENTOLIN  HFA) 108 (90 Base) MCG/ACT inhaler, Inhale 2 puffs into the lungs every 6 (six) hours as needed for wheezing., Disp: 2 each, Rfl: 11   carvedilol  (COREG ) 25 MG tablet, Take  1 tablet (25 mg total) by mouth 2 (two) times daily., Disp: 60 tablet, Rfl: 3   empagliflozin  (JARDIANCE ) 10 MG TABS tablet, Take 1 tablet (10 mg total) by mouth daily., Disp: 90 tablet, Rfl: 3   ferrous fumarate (HEMOCYTE - 106 MG FE) 325 (106 Fe) MG TABS tablet, Take 1 tablet by mouth daily., Disp: , Rfl:    Fluticasone -Umeclidin-Vilant (TRELEGY ELLIPTA) 100-62.5-25 MCG/ACT AEPB, Inhale 1 puff into the lungs as needed., Disp: , Rfl:    furosemide  (LASIX ) 40 MG tablet, Take 1 tablet (40 mg total) by mouth daily as needed., Disp: 90 tablet, Rfl: 3   sacubitril-valsartan (ENTRESTO ) 97-103 MG, Take 1 tablet by mouth 2 (two) times daily., Disp: 60 tablet, Rfl: 3   sodium bicarbonate  650 MG tablet, Take 1 tablet (650 mg total) by mouth 2 (two) times daily., Disp: 180 tablet, Rfl: 3   tamsulosin  (FLOMAX ) 0.4 MG CAPS capsule, TAKE ONE CAPSULE BY MOUTH DAILY AFTER BREAKFAST, Disp: 30 capsule, Rfl: 2   TRESIBA  FLEXTOUCH 100 UNIT/ML FlexTouch Pen, Inject 15 Units into the skin daily.,  Disp: , Rfl:    VITAMIN D  PO, Take by mouth daily., Disp: , Rfl:    VITAMIN E PO, Take by mouth daily., Disp: , Rfl:

## 2023-12-01 ENCOUNTER — Encounter (HOSPITAL_COMMUNITY): Payer: Self-pay | Admitting: Cardiology

## 2023-12-02 ENCOUNTER — Telehealth (HOSPITAL_COMMUNITY): Payer: Self-pay

## 2023-12-02 DIAGNOSIS — I5022 Chronic systolic (congestive) heart failure: Secondary | ICD-10-CM

## 2023-12-02 NOTE — Telephone Encounter (Signed)
-----   Message from [facility] sent at 11/30/2023  7:07 PM EST ----- Can we please ask Jordan Taylor to liberate his fluid intake. His renal function is slightly worse likely due to hypovolemia. Repeat BMP in 10 days.   Adi

## 2023-12-02 NOTE — Telephone Encounter (Signed)
 Spoke with patient regarding the following results. Patient made aware and patient verbalized understanding.   Repeat BMP ordered and scheduled.   Advised patient to call back to office with any issues, questions, or concerns. Patient verbalized understanding.

## 2023-12-13 ENCOUNTER — Other Ambulatory Visit (HOSPITAL_COMMUNITY): Payer: BC Managed Care – PPO

## 2023-12-16 ENCOUNTER — Other Ambulatory Visit (HOSPITAL_COMMUNITY): Payer: BC Managed Care – PPO

## 2024-01-03 DIAGNOSIS — N1832 Chronic kidney disease, stage 3b: Secondary | ICD-10-CM | POA: Diagnosis not present

## 2024-01-03 DIAGNOSIS — E1122 Type 2 diabetes mellitus with diabetic chronic kidney disease: Secondary | ICD-10-CM | POA: Diagnosis not present

## 2024-01-04 DIAGNOSIS — R0989 Other specified symptoms and signs involving the circulatory and respiratory systems: Secondary | ICD-10-CM | POA: Diagnosis not present

## 2024-01-04 DIAGNOSIS — J208 Acute bronchitis due to other specified organisms: Secondary | ICD-10-CM | POA: Diagnosis not present

## 2024-01-04 DIAGNOSIS — B9689 Other specified bacterial agents as the cause of diseases classified elsewhere: Secondary | ICD-10-CM | POA: Diagnosis not present

## 2024-01-04 DIAGNOSIS — R051 Acute cough: Secondary | ICD-10-CM | POA: Diagnosis not present

## 2024-01-06 DIAGNOSIS — I129 Hypertensive chronic kidney disease with stage 1 through stage 4 chronic kidney disease, or unspecified chronic kidney disease: Secondary | ICD-10-CM | POA: Diagnosis not present

## 2024-01-06 DIAGNOSIS — R809 Proteinuria, unspecified: Secondary | ICD-10-CM | POA: Diagnosis not present

## 2024-01-06 DIAGNOSIS — E1122 Type 2 diabetes mellitus with diabetic chronic kidney disease: Secondary | ICD-10-CM | POA: Diagnosis not present

## 2024-01-06 DIAGNOSIS — N1832 Chronic kidney disease, stage 3b: Secondary | ICD-10-CM | POA: Diagnosis not present

## 2024-01-19 ENCOUNTER — Other Ambulatory Visit (HOSPITAL_COMMUNITY): Payer: Self-pay

## 2024-01-19 ENCOUNTER — Ambulatory Visit (HOSPITAL_COMMUNITY)
Admission: RE | Admit: 2024-01-19 | Discharge: 2024-01-19 | Disposition: A | Discharge status: Home / Self Care | Payer: BC Managed Care – PPO | Source: Ambulatory Visit | Attending: Cardiology | Admitting: Cardiology

## 2024-01-19 ENCOUNTER — Ambulatory Visit (HOSPITAL_COMMUNITY)
Admission: RE | Admit: 2024-01-19 | Discharge: 2024-01-19 | Disposition: A | Discharge status: Home / Self Care | Payer: BC Managed Care – PPO | Source: Ambulatory Visit | Attending: Cardiology

## 2024-01-19 VITALS — BP 140/89 | HR 86 | Wt 230.0 lb

## 2024-01-19 DIAGNOSIS — I5022 Chronic systolic (congestive) heart failure: Secondary | ICD-10-CM | POA: Diagnosis not present

## 2024-01-19 DIAGNOSIS — I509 Heart failure, unspecified: Secondary | ICD-10-CM | POA: Diagnosis not present

## 2024-01-19 DIAGNOSIS — I110 Hypertensive heart disease with heart failure: Secondary | ICD-10-CM | POA: Insufficient documentation

## 2024-01-19 DIAGNOSIS — N1831 Chronic kidney disease, stage 3a: Secondary | ICD-10-CM

## 2024-01-19 DIAGNOSIS — I3139 Other pericardial effusion (noninflammatory): Secondary | ICD-10-CM | POA: Insufficient documentation

## 2024-01-19 DIAGNOSIS — E118 Type 2 diabetes mellitus with unspecified complications: Secondary | ICD-10-CM

## 2024-01-19 DIAGNOSIS — E119 Type 2 diabetes mellitus without complications: Secondary | ICD-10-CM | POA: Insufficient documentation

## 2024-01-19 DIAGNOSIS — I10 Essential (primary) hypertension: Secondary | ICD-10-CM

## 2024-01-19 DIAGNOSIS — G4733 Obstructive sleep apnea (adult) (pediatric): Secondary | ICD-10-CM

## 2024-01-19 LAB — ECHOCARDIOGRAM COMPLETE
AR max vel: 3.27 cm2
AV Area VTI: 2.9 cm2
AV Area mean vel: 2.89 cm2
AV Mean grad: 3 mm[Hg]
AV Peak grad: 4.3 mm[Hg]
Ao pk vel: 1.04 m/s
Area-P 1/2: 6.12 cm2
Calc EF: 45.3 %
MV VTI: 2.41 cm2
S' Lateral: 3.7 cm
Single Plane A2C EF: 46.6 %
Single Plane A4C EF: 41.9 %

## 2024-01-19 MED ORDER — PERFLUTREN LIPID MICROSPHERE
1.0000 mL | INTRAVENOUS | Status: DC | PRN
  Administered 2024-01-19: 2 mL via INTRAVENOUS

## 2024-01-19 MED ORDER — FINERENONE 10 MG PO TABS: 10.0000 mg | ORAL_TABLET | Freq: Every day | ORAL | 11 refills | Status: DC

## 2024-01-19 NOTE — Progress Notes (Signed)
ADVANCED HEART FAILURE CLINIC NOTE  Referring Physician: Fleet Contras, MD  Primary Care: Fleet Contras, MD Primary Cardiologist: Peter Swaz[place], MD  HPI: Jordan Taylor is a 58 y.o. male with heart failure with reduced ejection fraction, type 2 diabetes, CKD, hypertension presenting today to establish care.  His cardiac history dates back to November 2022 when echocardiogram demonstrated EF of 40 to 45% with global hypokinesis.  In September 2024 he was seen in cardiology clinic where symptoms of shortness of breath were progressing.  He was sent to the emergency department where he was admitted for acute respiratory failure requiring BiPAP secondary to acute systolic heart failure and COVID-19 pneumonia.  Repeat echo at that time with a EF of 30 to 35%.  Right and left heart catheterization with preserved cardiac output and no obstructive CAD.  Since that time he has been seen in [facility] clinic where GDMT was further uptitrated and he was also found to be volume overloaded with class III symptoms.  No recent tobacco, alcohol or drug use.  No reported family history of CHF.  Currently works as a Production designer, theatre/television/film at [employer].  Interval hx:  - Reports that home blood pressures are 120s/60s-80s - He is now walking up to 1.5 miles; reports that he gets short of breath at that point and stops.   Activity level/exercise tolerance:  NYHA II Orthopnea:  Sleeps on 2 pillows Paroxysmal noctural dyspnea:  No Chest pain/pressure:  No Orthostatic lightheadedness:  No Palpitations:  No Lower extremity edema:  No Presyncope/syncope:  No Cough:  No  Past Medical History:  Diagnosis Date   Essential hypertension 12/04/2013   Hyperlipidemia    Type 2 diabetes mellitus (HCC) 12/04/2013    Current Outpatient Medications  Medication Sig Dispense Refill   albuterol (VENTOLIN HFA) 108 (90 Base) MCG/ACT inhaler Inhale 2 puffs into the lungs every 6 (six) hours as needed for wheezing. 2 each 11    carvedilol (COREG) 25 MG tablet Take 1 tablet (25 mg total) by mouth 2 (two) times daily. 60 tablet 3   empagliflozin (JARDIANCE) 10 MG TABS tablet Take 1 tablet (10 mg total) by mouth daily. 90 tablet 3   ferrous fumarate (HEMOCYTE - 106 MG FE) 325 (106 Fe) MG TABS tablet Take 1 tablet by mouth daily.     Fluticasone-Umeclidin-Vilant (TRELEGY ELLIPTA) 100-62.5-25 MCG/ACT AEPB Inhale 1 puff into the lungs as needed.     furosemide (LASIX) 40 MG tablet Take 1 tablet (40 mg total) by mouth daily as needed. 90 tablet 3   sacubitril-valsartan (ENTRESTO) 97-103 MG Take 1 tablet by mouth 2 (two) times daily. 60 tablet 3   sodium bicarbonate 650 MG tablet Take 1 tablet (650 mg total) by mouth 2 (two) times daily. 180 tablet 3   tamsulosin (FLOMAX) 0.4 MG CAPS capsule TAKE ONE CAPSULE BY MOUTH DAILY AFTER BREAKFAST 30 capsule 2   TRESIBA FLEXTOUCH 100 UNIT/ML FlexTouch Pen Inject 15 Units into the skin daily.     VITAMIN D PO Take by mouth daily.     VITAMIN E PO Take by mouth daily.     No current facility-administered medications for this encounter.   Facility-Administered Medications Ordered in Other Encounters  Medication Dose Route Frequency Provider Last Rate Last Admin   perflutren lipid microspheres (DEFINITY) IV suspension  1-10 mL Intravenous PRN Kathleene Hazel, MD   2 mL at 01/19/24 0918     PHYSICAL EXAM: Vitals:   01/19/24 0913  BP: (!) 140/89  Pulse: 86  SpO2: 95%   GENERAL: NAD Lungs- cta CARDIAC:  JVP: 6 cm          Normal rate with regular rhythm. No murmur.  Pulses 2+. No edema.  ABDOMEN: Soft, non-tender, non-distended.  EXTREMITIES: Warm and well perfused.  NEUROLOGIC: No obvious FND   DATA REVIEW  ECG: 925/25: NSR  as per my personal interpretation  ECHO: 10/2021: LVEF 55 to 60%  9/24: LVEF 30 to 35%, preserved RV function 11/30/23: LVEF 55-60%  CATH: 08/06/23:  No angiographic evidence of CAD RA 4 RV 39/5/9 PA 36/20 mean 27 PCWP 12 LV  94/26/12 AO 105/70 CO 7.13 L/min CI 3.4    ASSESSMENT & PLAN:  Heart failure with reduced EF Etiology of HF: Possibly secondary to viral myocarditis.Will plan on CMR if LVEF does not improve over the next 3 months with escalating GDMT.  NYHA class / AHA Stage:NYHA IIB Volume status & Diuretics: Lasix 40mg  PRN only; has not required for a couple weeks now Vasodilators: Entresto 97/103mg  BID  Beta-Blocker: Coreg 25mg  BID MRA: start finerone 10mg  daily Cardiometabolic: Jardiance 10 mg daily Devices therapies & Valvulopathies:not currently indicated; repeat TTE today w/ LVEF 40% Advanced therapies:not currently indicated.   2.  Hypertension -Well controlled now -D/C Bidil -Increase Entresto to 97/103mg  BID -Increase coreg to 25mg  BID.  -BMP/BNP today.  3.  CKD 3A -Baseline creatinine around 1.7-2.  Repeat lab work today. - sCr of 2.1; reviewed labs on his phone from recent Nephrology appointment (01/04/24) - Will start fineronone today in the setting of T2DM & CKD 3A; sCr of 2.1 recently.   4.  Type 2 diabetes -Most recent A1c 7.6%.  Following with PCP. -Jardiance 10 mg daily -Discussed importance of continued dietary changes.   5.  Obesity -Body mass index is 38.27 kg/m. -Discussed importance of weight loss.   6. OSA - Followed by Armanda Magic now - compliant with CPAP    Charlie Seda Advanced Heart Failure Mechanical Circulatory Support

## 2024-01-19 NOTE — Patient Instructions (Signed)
START Finerenone 10 mg daily.  Blood work in 10 days.  Your physician recommends that you schedule a follow-up appointment in: 4 months (June) ** PLEASE CALL THE OFFICE IN APRIL TO ARRANGE YOUR FOLLOW UP APPOINTMENT.**  If you have any questions or concerns before your next appointment please send [place] a message through [place] or call our office at [phone].    TO LEAVE A MESSAGE FOR THE NURSE SELECT OPTION 2, PLEASE LEAVE A MESSAGE INCLUDING: YOUR NAME DATE OF BIRTH CALL BACK NUMBER REASON FOR CALL**this is important as we prioritize the call backs  YOU WILL RECEIVE A CALL BACK THE SAME DAY AS LONG AS YOU CALL BEFORE 4:00 PM  At the [facility], you and your health needs are our priority. As part of our continuing mission to provide you with exceptional heart care, we have created designated Provider Care Teams. These Care Teams include your primary Cardiologist (physician) and Advanced Practice Providers (APPs- Physician Assistants and Nurse Practitioners) who all work together to provide you with the care you need, when you need it.   You may see any of the following providers on your designated Care Team at your next follow up: Dr Arvilla Meres Dr Marca Ancona Dr. Dorthula Nettles Dr. Clearnce Hasten Amy Filbert Schilder, NP Robbie Lis, [place] [facility] [place], [place] Brynda Peon, NP [place] Lee, NP Karle Plumber, PharmD   Please be sure to bring in all your medications bottles to every appointment.    Thank you for choosing [facility]

## 2024-01-31 ENCOUNTER — Other Ambulatory Visit (HOSPITAL_COMMUNITY): Payer: BC Managed Care – PPO

## 2024-01-31 ENCOUNTER — Encounter (HOSPITAL_COMMUNITY): Payer: Self-pay

## 2024-02-01 NOTE — Telephone Encounter (Signed)
 Contacted patient via telephone to follow-up on message about cost concerns with [place].   Patient reports that he did not pick up [place] due to $400 copay. He asks whether he should restart Bidil (isosorbide-hydralazine), which was discontinued 10/2023. He was previously taking 1/2 tab of Bidil 20-37.5 mg PO TID. Patient confirms that he has increased Entresto to 97-103 mg PO BID, as instructed in 10/2023. Reports that home BP are consistently 120s/60s. Given that BP is controlled on target dose Entresto, advised patient to continue current regimen. Reminded to schedule follow-up with Dr. Gillis Ends in late April, at which time they can discuss appropriateness of restarting Bidil vs initiating spironolactone pending, kidney function, BP, and s/sx of HF. Confirmed plan with pharmacist in [facility], Karle Plumber, PharmD.  Nils Pyle, PharmD PGY1 Pharmacy Resident

## 2024-02-03 ENCOUNTER — Telehealth (HOSPITAL_COMMUNITY): Payer: Self-pay | Admitting: *Deleted

## 2024-02-03 ENCOUNTER — Other Ambulatory Visit (HOSPITAL_COMMUNITY): Payer: Self-pay

## 2024-02-03 DIAGNOSIS — I5022 Chronic systolic (congestive) heart failure: Secondary | ICD-10-CM

## 2024-02-03 DIAGNOSIS — N1831 Chronic kidney disease, stage 3a: Secondary | ICD-10-CM

## 2024-02-03 NOTE — Telephone Encounter (Signed)
 Pt sent mychart notification that he could not get the [place] due to cost ($800) per Delsa Bern, CPhT ins require PA and pt will need updated bmet and urine alb/cr ratio to complete the auth  Pt is aware and will come for labs on Mon 3/10

## 2024-02-07 ENCOUNTER — Ambulatory Visit (HOSPITAL_COMMUNITY)
Admission: RE | Admit: 2024-02-07 | Discharge: 2024-02-07 | Disposition: A | Discharge status: Home / Self Care | Source: Ambulatory Visit | Attending: Cardiology | Admitting: Cardiology

## 2024-02-07 DIAGNOSIS — N1831 Chronic kidney disease, stage 3a: Secondary | ICD-10-CM | POA: Diagnosis not present

## 2024-02-07 DIAGNOSIS — I5022 Chronic systolic (congestive) heart failure: Secondary | ICD-10-CM | POA: Diagnosis not present

## 2024-02-07 LAB — BASIC METABOLIC PANEL
Anion gap: 7 (ref 5–15)
BUN: 35 mg/dL — ABNORMAL HIGH (ref 6–20)
CO2: 21 mmol/L — ABNORMAL LOW (ref 22–32)
Calcium: 9.5 mg/dL (ref 8.9–10.3)
Chloride: 111 mmol/L (ref 98–111)
Creatinine, Ser: 2.23 mg/dL — ABNORMAL HIGH (ref 0.61–1.24)
GFR, Estimated: 33 mL/min — ABNORMAL LOW (ref >60)
Glucose, Bld: 174 mg/dL — ABNORMAL HIGH (ref 70–99)
Potassium: 4.9 mmol/L (ref 3.5–5.1)
Sodium: 139 mmol/L (ref 135–145)

## 2024-02-07 LAB — BRAIN NATRIURETIC PEPTIDE: B Natriuretic Peptide: 235.8 pg/mL — ABNORMAL HIGH (ref 0.0–100.0)

## 2024-02-08 LAB — MICROALBUMIN / CREATININE URINE RATIO
Creatinine, Urine: 63.3 mg/dL
Microalb Creat Ratio: 149 mg/g{creat} — ABNORMAL HIGH (ref 0–29)
Microalb, Ur: 94.4 ug/mL — ABNORMAL HIGH

## 2024-02-11 NOTE — Addendum Note (Signed)
 Encounter addended by: Howell Rucks, RDCS on: 02/11/2024 2:19 PM  Actions taken: Imaging Exam ended

## 2024-02-23 DIAGNOSIS — E559 Vitamin D deficiency, unspecified: Secondary | ICD-10-CM | POA: Diagnosis not present

## 2024-02-23 DIAGNOSIS — Z7189 Other specified counseling: Secondary | ICD-10-CM | POA: Diagnosis not present

## 2024-02-23 DIAGNOSIS — Z0000 Encounter for general adult medical examination without abnormal findings: Secondary | ICD-10-CM | POA: Diagnosis not present

## 2024-02-23 DIAGNOSIS — Z125 Encounter for screening for malignant neoplasm of prostate: Secondary | ICD-10-CM | POA: Diagnosis not present

## 2024-02-23 DIAGNOSIS — I5022 Chronic systolic (congestive) heart failure: Secondary | ICD-10-CM | POA: Diagnosis not present

## 2024-02-23 DIAGNOSIS — I10 Essential (primary) hypertension: Secondary | ICD-10-CM | POA: Diagnosis not present

## 2024-02-23 DIAGNOSIS — E1122 Type 2 diabetes mellitus with diabetic chronic kidney disease: Secondary | ICD-10-CM | POA: Diagnosis not present

## 2024-02-23 DIAGNOSIS — Z6836 Body mass index (BMI) 36.0-36.9, adult: Secondary | ICD-10-CM | POA: Diagnosis not present

## 2024-02-23 DIAGNOSIS — N183 Chronic kidney disease, stage 3 unspecified: Secondary | ICD-10-CM | POA: Diagnosis not present

## 2024-04-09 ENCOUNTER — Other Ambulatory Visit (HOSPITAL_COMMUNITY): Payer: Self-pay | Admitting: Cardiology

## 2024-04-09 DIAGNOSIS — I5022 Chronic systolic (congestive) heart failure: Secondary | ICD-10-CM

## 2024-05-24 DIAGNOSIS — N183 Chronic kidney disease, stage 3 unspecified: Secondary | ICD-10-CM | POA: Diagnosis not present

## 2024-05-24 DIAGNOSIS — I5022 Chronic systolic (congestive) heart failure: Secondary | ICD-10-CM | POA: Diagnosis not present

## 2024-05-24 DIAGNOSIS — E1122 Type 2 diabetes mellitus with diabetic chronic kidney disease: Secondary | ICD-10-CM | POA: Diagnosis not present

## 2024-05-24 DIAGNOSIS — I10 Essential (primary) hypertension: Secondary | ICD-10-CM | POA: Diagnosis not present

## 2024-06-05 DIAGNOSIS — N5201 Erectile dysfunction due to arterial insufficiency: Secondary | ICD-10-CM | POA: Diagnosis not present

## 2024-06-05 DIAGNOSIS — R351 Nocturia: Secondary | ICD-10-CM | POA: Diagnosis not present

## 2024-06-05 DIAGNOSIS — N401 Enlarged prostate with lower urinary tract symptoms: Secondary | ICD-10-CM | POA: Diagnosis not present

## 2024-08-01 ENCOUNTER — Other Ambulatory Visit (HOSPITAL_COMMUNITY): Payer: Self-pay | Admitting: Cardiology

## 2024-08-01 DIAGNOSIS — I5022 Chronic systolic (congestive) heart failure: Secondary | ICD-10-CM

## 2024-08-03 ENCOUNTER — Other Ambulatory Visit (HOSPITAL_COMMUNITY): Payer: Self-pay | Admitting: Cardiology

## 2024-08-03 DIAGNOSIS — I5022 Chronic systolic (congestive) heart failure: Secondary | ICD-10-CM

## 2024-09-20 ENCOUNTER — Other Ambulatory Visit (HOSPITAL_COMMUNITY): Payer: Self-pay

## 2024-09-20 DIAGNOSIS — I5022 Chronic systolic (congestive) heart failure: Secondary | ICD-10-CM

## 2024-09-20 MED ORDER — CARVEDILOL 25 MG PO TABS: 25.0000 mg | ORAL_TABLET | Freq: Two times a day (BID) | ORAL | 5 refills | Status: AC

## 2024-09-21 ENCOUNTER — Telehealth (HOSPITAL_COMMUNITY): Payer: Self-pay | Admitting: Cardiology

## 2024-09-21 NOTE — Telephone Encounter (Signed)
 Called to confirm/remind patient of their appointment at the [facility] on 09/21/24.   Appointment:   [] Confirmed  [x] Left mess   [] No answer/No voice mail  [] VM Full/unable to leave message  [] Phone not in service  Patient reminded to bring all medications and/or complete list.  Confirmed patient has transportation. Gave directions, instructed to utilize valet parking.

## 2024-09-22 ENCOUNTER — Telehealth (HOSPITAL_COMMUNITY): Payer: Self-pay

## 2024-09-22 ENCOUNTER — Encounter (HOSPITAL_COMMUNITY): Payer: Self-pay | Admitting: Cardiology

## 2024-09-22 ENCOUNTER — Other Ambulatory Visit (HOSPITAL_COMMUNITY): Payer: Self-pay

## 2024-09-22 ENCOUNTER — Ambulatory Visit (HOSPITAL_COMMUNITY)
Admission: RE | Admit: 2024-09-22 | Discharge: 2024-09-22 | Disposition: A | Discharge status: Home / Self Care | Source: Ambulatory Visit | Attending: Cardiology | Admitting: Cardiology

## 2024-09-22 VITALS — BP 118/80 | HR 100 | Ht 65.0 in | Wt 214.4 lb

## 2024-09-22 DIAGNOSIS — G4733 Obstructive sleep apnea (adult) (pediatric): Secondary | ICD-10-CM | POA: Diagnosis not present

## 2024-09-22 DIAGNOSIS — E669 Obesity, unspecified: Secondary | ICD-10-CM | POA: Insufficient documentation

## 2024-09-22 DIAGNOSIS — Z79899 Other long term (current) drug therapy: Secondary | ICD-10-CM | POA: Insufficient documentation

## 2024-09-22 DIAGNOSIS — I5022 Chronic systolic (congestive) heart failure: Secondary | ICD-10-CM | POA: Insufficient documentation

## 2024-09-22 DIAGNOSIS — N1832 Chronic kidney disease, stage 3b: Secondary | ICD-10-CM | POA: Insufficient documentation

## 2024-09-22 DIAGNOSIS — Z6835 Body mass index (BMI) 35.0-35.9, adult: Secondary | ICD-10-CM | POA: Diagnosis not present

## 2024-09-22 DIAGNOSIS — I428 Other cardiomyopathies: Secondary | ICD-10-CM | POA: Diagnosis not present

## 2024-09-22 DIAGNOSIS — Z8701 Personal history of pneumonia (recurrent): Secondary | ICD-10-CM | POA: Diagnosis not present

## 2024-09-22 DIAGNOSIS — E1122 Type 2 diabetes mellitus with diabetic chronic kidney disease: Secondary | ICD-10-CM | POA: Insufficient documentation

## 2024-09-22 DIAGNOSIS — I130 Hypertensive heart and chronic kidney disease with heart failure and stage 1 through stage 4 chronic kidney disease, or unspecified chronic kidney disease: Secondary | ICD-10-CM | POA: Diagnosis not present

## 2024-09-22 DIAGNOSIS — Z8616 Personal history of COVID-19: Secondary | ICD-10-CM | POA: Diagnosis not present

## 2024-09-22 LAB — BASIC METABOLIC PANEL WITH GFR
Anion gap: 10 (ref 5–15)
BUN: 31 mg/dL — ABNORMAL HIGH (ref 6–20)
CO2: 18 mmol/L — ABNORMAL LOW (ref 22–32)
Calcium: 8.8 mg/dL — ABNORMAL LOW (ref 8.9–10.3)
Chloride: 110 mmol/L (ref 98–111)
Creatinine, Ser: 1.97 mg/dL — ABNORMAL HIGH (ref 0.61–1.24)
GFR, Estimated: 38 mL/min — ABNORMAL LOW (ref >60)
Glucose, Bld: 84 mg/dL (ref 70–99)
Potassium: 4.4 mmol/L (ref 3.5–5.1)
Sodium: 138 mmol/L (ref 135–145)

## 2024-09-22 MED ORDER — KERENDIA 10 MG PO TABS: 10.0000 mg | ORAL_TABLET | Freq: Every day | ORAL | 11 refills | Status: AC

## 2024-09-22 NOTE — Patient Instructions (Signed)
 START Kerendia  10 mg daily.  Labs done today, your results will be available in MyChart, we will contact you for abnormal readings.  Your physician recommends that you schedule a follow-up appointment in: 3 months ( January 2026) ** PLEASE CALL THE OFFICE IN DECEMBER TO ARRANGE YOUR FOLLOW UP APPOINTMENT.**  If you have any questions or concerns before your next appointment please send us  a message through [place] or call our office at [phone].    TO LEAVE A MESSAGE FOR THE NURSE SELECT OPTION 2, PLEASE LEAVE A MESSAGE INCLUDING: YOUR NAME DATE OF BIRTH CALL BACK NUMBER REASON FOR CALL**this is important as we prioritize the call backs  YOU WILL RECEIVE A CALL BACK THE SAME DAY AS LONG AS YOU CALL BEFORE 4:00 PM  At the [facility], you and your health needs are our priority. As part of our continuing mission to provide you with exceptional heart care, we have created designated Provider Care Teams. These Care Teams include your primary Cardiologist (physician) and Advanced Practice Providers (APPs- Physician Assistants and Nurse Practitioners) who all work together to provide you with the care you need, when you need it.   You may see any of the following providers on your designated Care Team at your next follow up: Dr Toribio Fuel Dr Ezra Shuck Dr. Ria Commander Dr. Morene Brownie Amy Lenetta, NP Caffie Shed, [place] [facility] [place], [place] Beckey Coe, NP [place] Lee, NP Ellouise Class, NP Tinnie Redman, PharmD Jaun Bash, PharmD   Please be sure to bring in all your medications bottles to every appointment.    Thank you for choosing [facility]

## 2024-09-22 NOTE — Progress Notes (Signed)
   ADVANCED HEART FAILURE FOLLOW UP CLINIC NOTE  Referring Physician: Shelda Atlas, MD  Primary Care: Shelda Atlas, MD Primary Cardiologist:  HPI: Jordan Taylor is a 58 y.o. male who presents for follow up of chronic systolic heart failure.      His cardiac history dates back to November 2022 when echocardiogram demonstrated EF of 40 to 45% with global hypokinesis. In September 2024 he was seen in cardiology clinic where symptoms of shortness of breath were progressing. He was sent to the emergency department where he was admitted for acute respiratory failure requiring BiPAP secondary to acute systolic heart failure and COVID-19 pneumonia. Repeat echo at that time with a EF of 30 to 35%. Right and left heart catheterization with preserved cardiac output and no obstructive CAD. Since that time he has been seen in [facility] clinic where GDMT was further uptitrated and he was also found to be volume overloaded with class III symptoms. No recent tobacco, alcohol or drug use. No reported family history of CHF. Currently works as a production designer, theatre/television/film at [employer].      SUBJECTIVE:  Patient overall reports that he has been doing fairly well since his last visit. No active complaints. Discussed recently mildly improved EF. Also discussed EKG showing low voltage, no prior strain pattern, but given his demographics and EKG would be reasonable to rule out for TTR amyloid. No carpal tunnel, history of spinal stenosis and neuropathy.   PMH, current medications, allergies, social history, and family history reviewed in epic.  PHYSICAL EXAM: Vitals:   09/22/24 0844  BP: 118/80  Pulse: 100  SpO2: 94%   GENERAL: Well nourished and in no apparent distress at rest.  PULM:  Normal work of breathing, clear to auscultation bilaterally. Respirations are unlabored.  CARDIAC:  JVP: flat         Normal rate with regular rhythm. No murmurs, rubs or gallops.  No edema. Warm and well perfused extremities. ABDOMEN:  Soft, non-tender, non-distended. NEUROLOGIC: Patient is oriented x3 with no focal or lateralizing neurologic deficits.    DATA REVIEW  ECG: 08/2023: NSR, low voltage    ECHO: 10/2021: LVEF 55 to 60%  9/24: LVEF 30 to 35%, preserved RV function 11/30/23: LVEF 45-50%  01/19/2024: LVEF 35-40%, mildly reduced  CATH: 08/2023: No CAD, RA 4, PA 36/20, PCWP 12, Fick CO/CI 7.13/3.4     ASSESSMENT & PLAN:  Chronic systolic heart failure: Unclear etiology, NICM. HTN, myocarditis considerations, given low voltage on EKG will plan for amyloid workup. - Stable NYHA class II, euvolemic - REVEAL study candidate - AL labs ordered - PYP scan - Continue entresto  97/103mg  BID - Start finerenone  10mg  given CKD and DM - Continue jardiance  10mg  daily - Continue carvedilol  25mg  BID - Lasix  40mg  prn - CMR if PYP unrevealing  Hypertension: Well controlled on current regimen.  - Add finerenone  as above  CKD Stage IIIb: Last GFR 38. - Start finerenone , lab recheck  T2DM:  - Last A1c 7.6, continue SGLT2  Obesity:  - BMI 35.7  OSA:  - Compliant with CPAP therapy  Follow up in 3 months  Morene Brownie, MD [facility] Mechanical Circulatory Support 09/25/24

## 2024-09-25 ENCOUNTER — Other Ambulatory Visit (HOSPITAL_COMMUNITY): Payer: Self-pay

## 2024-09-25 LAB — KAPPA/LAMBDA LIGHT CHAINS
Kappa free light chain: 50.3 mg/L — ABNORMAL HIGH (ref 3.3–19.4)
Kappa, lambda light chain ratio: 1.18 (ref 0.26–1.65)
Lambda free light chains: 42.5 mg/L — ABNORMAL HIGH (ref 5.7–26.3)

## 2024-09-25 NOTE — Telephone Encounter (Signed)
 [facility] Patient Advocate Encounter  Prior authorization for Kerendia  has been submitted and approved. Test billing returns $0 for 90 day supply.  Key: [id number] Effective: 08/26/2024 to 09/25/2025  Rachel DEL, CPhT Rx Patient Advocate Phone: [phone]

## 2024-09-27 ENCOUNTER — Ambulatory Visit (HOSPITAL_COMMUNITY): Payer: Self-pay | Admitting: Cardiology

## 2024-09-27 LAB — MULTIPLE MYELOMA PANEL, SERUM
Albumin SerPl Elph-Mcnc: 3.2 g/dL (ref 2.9–4.4)
Albumin/Glob SerPl: 1.2 (ref 0.7–1.7)
Alpha 1: 0.3 g/dL (ref 0.0–0.4)
Alpha2 Glob SerPl Elph-Mcnc: 0.7 g/dL (ref 0.4–1.0)
B-Globulin SerPl Elph-Mcnc: 1 g/dL (ref 0.7–1.3)
Gamma Glob SerPl Elph-Mcnc: 0.9 g/dL (ref 0.4–1.8)
Globulin, Total: 2.9 g/dL (ref 2.2–3.9)
IgA: 285 mg/dL (ref 90–386)
IgG (Immunoglobin G), Serum: 1160 mg/dL (ref 603–1613)
IgM (Immunoglobulin M), Srm: 95 mg/dL (ref 20–172)
Total Protein ELP: 6.1 g/dL (ref 6.0–8.5)

## 2024-09-28 ENCOUNTER — Encounter

## 2024-09-28 VITALS — BP 114/80 | HR 98 | Temp 98.4°F | Resp 16 | Ht 65.0 in | Wt 215.6 lb

## 2024-09-28 DIAGNOSIS — Z006 Encounter for examination for normal comparison and control in clinical research program: Secondary | ICD-10-CM

## 2024-09-28 NOTE — Research (Signed)
 Reveal Trial Informed Consent   Subject Name: Jordan Taylor  Subject met inclusion and exclusion criteria.  The informed consent form, study requirements and expectations were reviewed with the subject and questions and concerns were addressed prior to the signing of the consent form.  The subject verbalized understanding of the trial requirements.  The subject agreed to participate in the Reveal trial and signed the informed consent at 0856 on 09/28/2024.  The informed consent was obtained prior to performance of any protocol-specific procedures for the subject.  A copy of the signed informed consent was given to the subject and a copy was placed in the subject's medical record.   NORITA IHA D   INCLUSION-EXCLUSION INCLUSION Y N  Understands the study procedures and can give signed informed consent (as specified in Section 10.1.3), which includes compliance with the requirements and restrictions listed in the ICF and in this protocol [x]  []   Male or male >=60 years of age. [x]  []   Is suspected of having cardiac amyloidosis and is undergoing or will undergo a diagnostic evaluation (Section 8.1.3) for cardiac amyloidosis (e.g., ECHO, CMR, bone-avid tracer cardiac SPECT, extracardiac biopsy, or EMB). Participants may be enrolled before or during their diagnostic evaluation for cardiac amyloidosis [x]  []   Able to undergo PET/CT imaging as part of the study, including ability to lie supine for approximately 1 hour. [x]  []   For WOCBP: agreement to remain abstinent (refrain from heterosexual intercourse) or use contraceptive methods that result in a failure rate of <1% per year during the treatment period and for at least 30 days after administration of I-124 evuzamitide. [x]  []   ~   A woman is considered of childbearing potential if she is postmenarchal, has not reached a postmenopausal state (>=12 months of amenorrhea with no identified cause other than menopause), and has not undergone  surgical sterilization (removal of ovaries and/or uterus). The definition of childbearing potential may be adapted for alignment with local guidelines or requirements []  []   ~   Examples of contraceptive methods with a failure rate of <1% per year include  bilateral tubal ligation, male sterilization, established and proper use of hormonal  contraceptives that inhibit ovulation, hormone-releasing intrauterine devices, and copper intrauterine devices []  []   ~   ~  Contraception methods that do not result in a failure rate of <1% per year, such as cap, diaphragm, or sponge with spermicide or male or male condom with or without spermicide, are not acceptable []  []   ~  ~   The reliability of sexual abstinence should be evaluated in relation to the duration of the clinical trial and the preferred and usual lifestyle of the participant. Periodic abstinence (e.g., calendar, ovulation, symptothermal, or post-ovulation methods) and withdrawal are not acceptable methods of contraception. []  []   For men: agreement to remain abstinent or use contraceptive measures and agreement to refrain from donating sperm, as defined below: [x]  []   ~  With male partners of childbearing potential, men must remain abstinent or use a condom plus an additional contraceptive method that together result in a failure rate of <1% per year during the treatment period and for at least 120 days (30 days plus a 90-day spermatogenesis cycle) after the last dose of study intervention. Men must refrain from donating sperm during this same period. [x]  []                   EXCLUSION: Y N  Established diagnosis of cardiac amyloidosis. []  [x]   Established diagnosis of systemic  amyloidosis with known organ involvement (e.g., renal AL or ATTR peripheral neuropathy). Participants who are amyloid positive only from peripheral tissue, such as abdominal fat, carpal tunnel tissue, or laminectomy tissue, are allowed as long as they do not have other  known organ involvement. []  [x]   Receiving therapy with an approved treatment (e.g., tafamidis) or in a clinical trial for treatment for ATTR cardiac amyloidosis at time of enrollment. For AL and ATTR amyloidosis: Patients may enter the screening period of a clinical trial for treatment after the ICF is signed and they are enrolled in this trial, but they may not receive an experimental therapy until after the Day 30 safety follow-up visit in this trial. []  [x]   Is pregnant or breast-feeding []  [x]   Is mentally or legally incapacitated, has significant emotional problems at the time of the study, or has a history of psychosis. []  [x]   Has a known allergy to potassium iodide (KI). []  [x]   Receiving hemodialysis or peritoneal dialysis []  [x]   Estimated glomerular filtration rate (eGFR) less than 15 mL/min/1.61m2. []  [x]   Myocardial infarction within 3 months of screening []  [x]   Has severe claustrophobia or any condition, medical or otherwise, that would prevent completion of the study assessments. []  [x]   Has any illness that, in the opinion of the Investigator, might confound the results of the study or pose additional risk to the participant []  [x]   Has received heparin  or heparin  analogs (e.g., enoxaparin, dalteparin, fondaparinux) within 7 days prior to I-124 evuzamitide administration []  [x]   Known uncorrected thyroid  disorder (other than mild elevation of thyroid -stimulating hormone with normal thyroxine [T4]). []  [x]    VITALS: 0903 BIOMARKERS AND LAB:0907 ECG: 0913 KCCQ: 0900 REVIEW MEDICAL HISTORY    Current Outpatient Medications:    albuterol  (VENTOLIN  HFA) 108 (90 Base) MCG/ACT inhaler, Inhale 2 puffs into the lungs every 6 (six) hours as needed for wheezing. (Patient not taking: Reported on 09/22/2024), Disp: 2 each, Rfl: 11   carvedilol  (COREG ) 25 MG tablet, Take 1 tablet (25 mg total) by mouth 2 (two) times daily with a meal., Disp: 60 tablet, Rfl: 5   co-enzyme Q-10 30 MG  capsule, Take 30 mg by mouth 3 (three) times daily., Disp: , Rfl:    empagliflozin  (JARDIANCE ) 10 MG TABS tablet, Take 1 tablet (10 mg total) by mouth daily., Disp: 90 tablet, Rfl: 3   ferrous fumarate (HEMOCYTE - 106 MG FE) 325 (106 Fe) MG TABS tablet, Take 1 tablet by mouth daily., Disp: , Rfl:    Finerenone  (KERENDIA ) 10 MG TABS, Take 1 tablet (10 mg total) by mouth daily., Disp: 30 tablet, Rfl: 11   Fluticasone -Umeclidin-Vilant (TRELEGY ELLIPTA) 100-62.5-25 MCG/ACT AEPB, Inhale 1 puff into the lungs as needed. (Patient not taking: Reported on 09/22/2024), Disp: , Rfl:    furosemide  (LASIX ) 40 MG tablet, Take 1 tablet (40 mg total) by mouth daily as needed., Disp: 90 tablet, Rfl: 3   Omega-3 Fatty Acids (FISH OIL) 300 MG CAPS, Take by mouth., Disp: , Rfl:    sacubitril-valsartan (ENTRESTO ) 97-103 MG, Take 1 tablet by mouth 2 (two) times daily., Disp: 60 tablet, Rfl: 3   sodium bicarbonate  650 MG tablet, Take 1 tablet (650 mg total) by mouth 2 (two) times daily., Disp: 180 tablet, Rfl: 3   tamsulosin  (FLOMAX ) 0.4 MG CAPS capsule, TAKE ONE CAPSULE BY MOUTH DAILY AFTER BREAKFAST, Disp: 30 capsule, Rfl: 2   TRESIBA  FLEXTOUCH 100 UNIT/ML FlexTouch Pen, Inject 15 Units into the skin daily., Disp: , Rfl:  VITAMIN D  PO, Take by mouth daily., Disp: , Rfl:    VITAMIN E PO, Take by mouth daily., Disp: , Rfl:

## 2024-09-29 ENCOUNTER — Other Ambulatory Visit (HOSPITAL_COMMUNITY): Payer: Self-pay | Admitting: Cardiovascular Disease

## 2024-09-29 DIAGNOSIS — I43 Cardiomyopathy in diseases classified elsewhere: Secondary | ICD-10-CM

## 2024-10-11 ENCOUNTER — Encounter: Payer: Self-pay | Admitting: *Deleted

## 2024-10-11 ENCOUNTER — Other Ambulatory Visit (HOSPITAL_COMMUNITY)

## 2024-10-11 ENCOUNTER — Ambulatory Visit (HOSPITAL_COMMUNITY)
Admission: RE | Admit: 2024-10-11 | Discharge: 2024-10-11 | Disposition: A | Discharge status: Home / Self Care | Payer: Self-pay | Source: Ambulatory Visit | Attending: Cardiovascular Disease | Admitting: Cardiovascular Disease

## 2024-10-11 DIAGNOSIS — I43 Cardiomyopathy in diseases classified elsewhere: Secondary | ICD-10-CM | POA: Insufficient documentation

## 2024-10-11 DIAGNOSIS — E854 Organ-limited amyloidosis: Secondary | ICD-10-CM | POA: Insufficient documentation

## 2024-10-11 DIAGNOSIS — Z006 Encounter for examination for normal comparison and control in clinical research program: Secondary | ICD-10-CM

## 2024-10-11 MED ORDER — IODINE-124 EVUZAMITIDE
1.0000 | Freq: Once | INTRAVENOUS | Status: AC
  Administered 2024-10-11: 1.01 via INTRAVENOUS

## 2024-10-12 ENCOUNTER — Encounter: Payer: Self-pay | Admitting: *Deleted

## 2024-10-12 DIAGNOSIS — Z006 Encounter for examination for normal comparison and control in clinical research program: Secondary | ICD-10-CM

## 2024-10-13 ENCOUNTER — Encounter: Payer: Self-pay | Admitting: *Deleted

## 2024-10-13 DIAGNOSIS — Z006 Encounter for examination for normal comparison and control in clinical research program: Secondary | ICD-10-CM

## 2024-10-17 ENCOUNTER — Encounter: Admitting: *Deleted

## 2024-10-17 VITALS — BP 133/66 | HR 100 | Temp 98.1°F | Resp 16 | Wt 213.8 lb

## 2024-10-17 DIAGNOSIS — Z006 Encounter for examination for normal comparison and control in clinical research program: Secondary | ICD-10-CM

## 2024-10-17 NOTE — Research (Signed)
 Reveal  VS: 0841 Labs: 0850  Will see patient back at day 30.   Patient doing well, no chest pains or shortness of breath.   Suzen Hardy :) RN BSN  Clinical Research Nurse  Be strong and take heart, all you who hope in the Lord. ~ Psalm 31:24   Current Outpatient Medications:    albuterol  (VENTOLIN  HFA) 108 (90 Base) MCG/ACT inhaler, Inhale 2 puffs into the lungs every 6 (six) hours as needed for wheezing. (Patient not taking: Reported on 09/22/2024), Disp: 2 each, Rfl: 11   carvedilol  (COREG ) 25 MG tablet, Take 1 tablet (25 mg total) by mouth 2 (two) times daily with a meal., Disp: 60 tablet, Rfl: 5   co-enzyme Q-10 30 MG capsule, Take 30 mg by mouth 3 (three) times daily., Disp: , Rfl:    empagliflozin  (JARDIANCE ) 10 MG TABS tablet, Take 1 tablet (10 mg total) by mouth daily., Disp: 90 tablet, Rfl: 3   ferrous fumarate (HEMOCYTE - 106 MG FE) 325 (106 Fe) MG TABS tablet, Take 1 tablet by mouth daily., Disp: , Rfl:    Finerenone  (KERENDIA ) 10 MG TABS, Take 1 tablet (10 mg total) by mouth daily., Disp: 30 tablet, Rfl: 11   Fluticasone -Umeclidin-Vilant (TRELEGY ELLIPTA) 100-62.5-25 MCG/ACT AEPB, Inhale 1 puff into the lungs as needed. (Patient not taking: Reported on 09/22/2024), Disp: , Rfl:    furosemide  (LASIX ) 40 MG tablet, Take 1 tablet (40 mg total) by mouth daily as needed., Disp: 90 tablet, Rfl: 3   Omega-3 Fatty Acids (FISH OIL) 300 MG CAPS, Take by mouth., Disp: , Rfl:    sacubitril-valsartan (ENTRESTO ) 97-103 MG, Take 1 tablet by mouth 2 (two) times daily., Disp: 60 tablet, Rfl: 3   sodium bicarbonate  650 MG tablet, Take 1 tablet (650 mg total) by mouth 2 (two) times daily., Disp: 180 tablet, Rfl: 3   tamsulosin  (FLOMAX ) 0.4 MG CAPS capsule, TAKE ONE CAPSULE BY MOUTH DAILY AFTER BREAKFAST, Disp: 30 capsule, Rfl: 2   TRESIBA  FLEXTOUCH 100 UNIT/ML FlexTouch Pen, Inject 15 Units into the skin daily., Disp: , Rfl:    VITAMIN D  PO, Take by mouth daily., Disp: , Rfl:    VITAMIN  E PO, Take by mouth daily., Disp: , Rfl:

## 2024-10-23 ENCOUNTER — Other Ambulatory Visit (HOSPITAL_COMMUNITY): Payer: Self-pay

## 2024-10-23 DIAGNOSIS — Z006 Encounter for examination for normal comparison and control in clinical research program: Secondary | ICD-10-CM

## 2024-10-25 NOTE — Research (Signed)
 Reveal day 1   KI+ given at 0921 IV - right forearm 22gz @ 0942 I-I24 given @ 0959 IV out @ 1001   Dose at prep 1.10mci Volume @ prep 2ml  Act dose adm 1.01 mci Total volume 2ml  + 20 saline flushes   Residual 0.04  No med changes per patient  No ae or sae to report

## 2024-11-01 ENCOUNTER — Other Ambulatory Visit (HOSPITAL_COMMUNITY)

## 2024-11-01 ENCOUNTER — Other Ambulatory Visit (HOSPITAL_COMMUNITY): Payer: Self-pay | Admitting: Cardiology

## 2024-11-02 ENCOUNTER — Other Ambulatory Visit (HOSPITAL_COMMUNITY): Payer: Self-pay | Admitting: Cardiology

## 2024-11-02 DIAGNOSIS — Z006 Encounter for examination for normal comparison and control in clinical research program: Secondary | ICD-10-CM

## 2024-11-03 ENCOUNTER — Ambulatory Visit (HOSPITAL_COMMUNITY)
Admission: RE | Admit: 2024-11-03 | Discharge: 2024-11-03 | Disposition: A | Source: Ambulatory Visit | Attending: Cardiology

## 2024-11-03 DIAGNOSIS — I5022 Chronic systolic (congestive) heart failure: Secondary | ICD-10-CM | POA: Diagnosis not present

## 2024-11-03 DIAGNOSIS — Z006 Encounter for examination for normal comparison and control in clinical research program: Secondary | ICD-10-CM

## 2024-11-03 MED ORDER — TC 99M HYDROXYMETHYLENE DIPHOSPHONATE INJECTION
21.6000 | Freq: Once | INTRAVENOUS | Status: AC
  Administered 2024-11-03: 21.6 via INTRAVENOUS

## 2024-11-08 ENCOUNTER — Telehealth: Payer: Self-pay | Admitting: Cardiology

## 2024-11-08 NOTE — Telephone Encounter (Signed)
 Traci from radiology called with the CT results for the Amyloid study with HDP. I will let the provider know the results can be seen in mychart.

## 2024-11-08 NOTE — Telephone Encounter (Signed)
Message sent to Dr. Stoner

## 2024-11-08 NOTE — Telephone Encounter (Signed)
Calling with a call report. Please advise  

## 2024-11-10 ENCOUNTER — Encounter

## 2024-11-10 ENCOUNTER — Other Ambulatory Visit (HOSPITAL_COMMUNITY): Payer: Self-pay

## 2024-11-10 DIAGNOSIS — I5022 Chronic systolic (congestive) heart failure: Secondary | ICD-10-CM

## 2024-11-10 MED ORDER — FUROSEMIDE 40 MG PO TABS: 40.0000 mg | ORAL_TABLET | Freq: Every day | ORAL | 0 refills | Status: AC | PRN

## 2024-11-10 NOTE — Research (Unsigned)
 Reveal Visit 3  Questionnaire:  Blood draw:  Meds have changed  Decreased entresto  07/2024 Started on BIDIL 10/30/2024 NO more lasix  Glipizide  since feb  Car 09/09/2024 Have not started ozempic

## 2024-12-27 NOTE — Research (Signed)
 Late entry   Spoke with patient  He took his KI 130 mg today at 10 am   Patient doing well, no complaints of chest pain or shortness of breath

## 2024-12-27 NOTE — Research (Signed)
 Spoke with patient on phone  He took his KI 130 mg this am at 10am  No complaints of chest pain or shortness of breath  Suzen Hardy :) RN BSN  Clinical Research Nurse  Be strong and take heart, all you who hope in the [place]. ~ Psalm 31:24

## 2024-12-27 NOTE — Research (Addendum)
 Duplicate. Error

## 2024-12-28 ENCOUNTER — Other Ambulatory Visit: Payer: Self-pay

## 2024-12-28 DIAGNOSIS — I5022 Chronic systolic (congestive) heart failure: Secondary | ICD-10-CM

## 2025-01-05 MED ORDER — SODIUM BICARBONATE 650 MG PO TABS: 650.0000 mg | ORAL_TABLET | Freq: Two times a day (BID) | ORAL | 0 refills | Status: AC
